# Patient Record
Sex: Female | Born: 1970 | Race: White | Hispanic: No | Smoking: Former smoker
Health system: Southern US, Community
[De-identification: ages and names within clinical notes are randomized; demographics above are authoritative.]

## PROBLEM LIST (undated history)

## (undated) DIAGNOSIS — F319 Bipolar disorder, unspecified: Secondary | ICD-10-CM

## (undated) DIAGNOSIS — E079 Disorder of thyroid, unspecified: Secondary | ICD-10-CM

## (undated) DIAGNOSIS — F209 Schizophrenia, unspecified: Secondary | ICD-10-CM

---

## 2015-09-21 ENCOUNTER — Encounter (HOSPITAL_COMMUNITY): Payer: Self-pay | Admitting: Emergency Medicine

## 2015-09-21 ENCOUNTER — Emergency Department (HOSPITAL_COMMUNITY)
Admission: EM | Admit: 2015-09-21 | Discharge: 2015-09-23 | Disposition: A | Discharge status: Home / Self Care | Payer: Medicare Other | Attending: Emergency Medicine | Admitting: Emergency Medicine

## 2015-09-21 DIAGNOSIS — F259 Schizoaffective disorder, unspecified: Secondary | ICD-10-CM | POA: Diagnosis present

## 2015-09-21 DIAGNOSIS — E079 Disorder of thyroid, unspecified: Secondary | ICD-10-CM | POA: Diagnosis not present

## 2015-09-21 DIAGNOSIS — Z915 Personal history of self-harm: Secondary | ICD-10-CM | POA: Insufficient documentation

## 2015-09-21 DIAGNOSIS — F39 Unspecified mood [affective] disorder: Secondary | ICD-10-CM

## 2015-09-21 DIAGNOSIS — Z3202 Encounter for pregnancy test, result negative: Secondary | ICD-10-CM | POA: Diagnosis not present

## 2015-09-21 DIAGNOSIS — Z79899 Other long term (current) drug therapy: Secondary | ICD-10-CM | POA: Insufficient documentation

## 2015-09-21 DIAGNOSIS — Z008 Encounter for other general examination: Secondary | ICD-10-CM | POA: Diagnosis present

## 2015-09-21 HISTORY — DX: Schizophrenia, unspecified: F20.9

## 2015-09-21 HISTORY — DX: Disorder of thyroid, unspecified: E07.9

## 2015-09-21 HISTORY — DX: Bipolar disorder, unspecified: F31.9

## 2015-09-21 LAB — CBC
HCT: 36.3 % (ref 36.0–46.0)
Hemoglobin: 11.5 g/dL — ABNORMAL LOW (ref 12.0–15.0)
MCH: 28.9 pg (ref 26.0–34.0)
MCHC: 31.7 g/dL (ref 30.0–36.0)
MCV: 91.2 fL (ref 78.0–100.0)
Platelets: 251 10*3/uL (ref 150–400)
RBC: 3.98 MIL/uL (ref 3.87–5.11)
RDW: 15.6 % — AB (ref 11.5–15.5)
WBC: 8.4 10*3/uL (ref 4.0–10.5)

## 2015-09-21 LAB — COMPREHENSIVE METABOLIC PANEL
ALK PHOS: 71 U/L (ref 38–126)
ALT: 36 U/L (ref 14–54)
ANION GAP: 10 (ref 5–15)
AST: 24 U/L (ref 15–41)
Albumin: 3.9 g/dL (ref 3.5–5.0)
BILIRUBIN TOTAL: 0.9 mg/dL (ref 0.3–1.2)
BUN: 15 mg/dL (ref 6–20)
CALCIUM: 9.1 mg/dL (ref 8.9–10.3)
CO2: 25 mmol/L (ref 22–32)
Chloride: 102 mmol/L (ref 101–111)
Creatinine, Ser: 1.17 mg/dL — ABNORMAL HIGH (ref 0.44–1.00)
GFR calc non Af Amer: 56 mL/min — ABNORMAL LOW (ref >60)
Glucose, Bld: 118 mg/dL — ABNORMAL HIGH (ref 65–99)
Potassium: 4.2 mmol/L (ref 3.5–5.1)
SODIUM: 137 mmol/L (ref 135–145)
TOTAL PROTEIN: 8 g/dL (ref 6.5–8.1)

## 2015-09-21 LAB — I-STAT BETA HCG BLOOD, ED (MC, WL, AP ONLY)

## 2015-09-21 LAB — ACETAMINOPHEN LEVEL

## 2015-09-21 LAB — TSH: TSH: >90 u[IU]/mL — ABNORMAL HIGH (ref 0.350–4.500)

## 2015-09-21 LAB — ETHANOL: Alcohol, Ethyl (B): <5 mg/dL (ref <5)

## 2015-09-21 LAB — SALICYLATE LEVEL

## 2015-09-21 LAB — LITHIUM LEVEL: Lithium Lvl: 0.08 mmol/L — ABNORMAL LOW (ref 0.60–1.20)

## 2015-09-21 MED ORDER — LEVOTHYROXINE SODIUM 200 MCG PO TABS
200.0000 ug | ORAL_TABLET | Freq: Every day | ORAL | Status: DC
  Administered 2015-09-22 – 2015-09-23 (×2): 200 ug via ORAL
  Filled 2015-09-21 (×4): qty 1

## 2015-09-21 MED ORDER — B COMPLEX-C PO TABS
1.0000 | ORAL_TABLET | Freq: Every day | ORAL | Status: DC
  Administered 2015-09-22 – 2015-09-23 (×2): 1 via ORAL
  Filled 2015-09-21 (×3): qty 1

## 2015-09-21 NOTE — ED Notes (Signed)
Pt oriented to room and unit.  She denies H/I, S/I, and AVH  She is focussed on her thyroid and believes that is why she is in the hospital.  She is pleasant and cooperative.  15 minute checks and video monitoring continue.

## 2015-09-21 NOTE — ED Provider Notes (Signed)
CSN: [id number]     Arrival date & time 09/21/15  1238 History   First MD Initiated Contact with Patient 09/21/15 1502     Chief Complaint  Patient presents with  . Medical Clearance     The history is provided by the patient and a relative. No language interpreter was used.   Eldana Isip is a 45 y.o. female who presents to the Emergency Department complaining of Psychiatric Evaluation.  Per pt she is here to get her thyroid checked because she thinks it is low due to vitamins mixing with her medications and she feels tired often. Pt is here with IVC by mother.  Pt thinks that her mental illness is due to her thyroid disease and that the psychiatric meds interfere with her thyroid medications.  Per IVC she is noncompliant with her psych meds, hearing voices and that the president is communicating to her and instructing her to do things.  She has a hx/o prior suicide attempt after taking too much Nyquil.    Past Medical History  Diagnosis Date  . Bipolar 1 disorder (HCC)   . Schizophrenia (HCC)   . Thyroid disease    History reviewed. No pertinent past surgical history. No family history on file. Social History  Substance Use Topics  . Smoking status: Never Smoker   . Smokeless tobacco: None  . Alcohol Use: No   OB History    No data available     Review of Systems  All other systems reviewed and are negative.     Allergies  Review of patient's allergies indicates no known allergies.  Home Medications   Prior to Admission medications   Medication Sig Start Date End Date Taking? Authorizing Provider  b complex vitamins tablet Take 1 tablet by mouth daily.   Yes Historical Provider, MD  ibuprofen (ADVIL,MOTRIN) 200 MG tablet Take 600-800 mg by mouth every 6 (six) hours as needed for headache or moderate pain.   Yes Historical Provider, MD  levothyroxine (SYNTHROID, LEVOTHROID) 200 MCG tablet Take 200 mcg by mouth daily. 09/07/15  Yes Historical Provider, MD   BP 128/77  mmHg  Pulse 85  Temp(Src) 98.1 F (36.7 C) (Oral)  Resp 18  SpO2 96%  LMP 09/07/2015 Physical Exam  Constitutional: She is oriented to person, place, and time. She appears well-developed and well-nourished.  HENT:  Head: Normocephalic and atraumatic.  Cardiovascular: Normal rate and regular rhythm.   No murmur heard. Pulmonary/Chest: Effort normal and breath sounds normal. No respiratory distress.  Abdominal: Soft. There is no tenderness. There is no rebound and no guarding.  Musculoskeletal: She exhibits no edema or tenderness.  Neurological: She is alert and oriented to person, place, and time.  Skin: Skin is warm and dry.  Psychiatric:  Flat affect  Nursing note and vitals reviewed.   ED Course  Procedures (including critical care time) Labs Review Labs Reviewed  COMPREHENSIVE METABOLIC PANEL - Abnormal; Notable for the following:    Glucose, Bld 118 (*)    Creatinine, Ser 1.17 (*)    GFR calc non Af Amer 56 (*)    All other components within normal limits  ACETAMINOPHEN LEVEL - Abnormal; Notable for the following:    Acetaminophen (Tylenol), Serum <10 (*)    All other components within normal limits  CBC - Abnormal; Notable for the following:    Hemoglobin 11.5 (*)    RDW 15.6 (*)    All other components within normal limits  LITHIUM LEVEL - Abnormal;  Notable for the following:    Lithium Lvl 0.08 (*)    All other components within normal limits  ETHANOL  SALICYLATE LEVEL  URINE RAPID DRUG SCREEN, HOSP PERFORMED  TSH  I-STAT BETA HCG BLOOD, ED ([facility], [facility], [facility] ONLY)    Imaging Review No results found. I have personally reviewed and evaluated these images and lab results as part of my medical decision-making.   EKG Interpretation None      MDM   Final diagnoses:  Mood disorder [facility])    Patient here with delusions and hallucinations and IVC paperwork completed by the patient's mother. Patient denies delusions or hallucinations but appears to be fixated on  her thyroid.  Plan to observe with psychiatry reevaluation in the morning.  Pt's TSH is greater than 90, free T4 pending at time of note.  Plan to restart her synthroid with Endocrinology follow up.    Tilden Fossa, MD 09/22/15 [id number]

## 2015-09-21 NOTE — ED Notes (Signed)
Bed: [facility] Expected date:  Expected time:  Means of arrival:  Comments: Hold for [employer]

## 2015-09-21 NOTE — BH Assessment (Addendum)
Tele Assessment Note   Meghan Hensley is a 45 y.o. Caucasian female who was admitted to the [facility] ED under IVC (petition completed by [name], [name] -- [phone]); mother reported that Pt is not taking prescribed psychotropic medication and is experiencing delusional thinking and acting recklessly (e.g., taking out insurance under false pretenses, using her mother's name as a co-signer).  Per mother's report, Pt has a history of Bipolar disorder and schizophrenia.  Pt was assessed via the teleassessment machine.  Demeanor was very cautious -- she appeared to consider her words based on the impact it may have.  Mood was reported variably -- she stated that she felt "fine" and then said that she is off of her thyroid medication and that can make her feel depressed and that she feels depressed now.  Affect was guarded and flat.  She appeared fatigued.  She denied suicidal ideation, but endorsed a past suicide by overdose of Nyquil in 12-18-1996.  Pt denied that she takes psychotropic medications, denied having a psychiatrist, and denied being in therapy.  "It's not a bad idea, but I don't know with my thyroid."  When asked why she was at the hospital, Pt stated that she has a bad thyroid and has been off of her thyroid medication.  Pt appeared to be preoccupied with two subjects -- her thyroid condition (which alternated between hypothyroidism and hyperthyroidism) and the death of her father last year.  Pt stated that she is on disability due to hypothyroidism.  When asked about her mental state, she related it to her thyroid condition.  "My problem is that my thyroid is too low ... It makes me depressed, and so I'm not eating right now."  Pt denied any auditory/visual hallucination, and she also denied any delusion.  When asked about her mother, Pt said that she needs to protect her mother -- her mother is a widow who lives in [place] "for her safety", and she is concerned that her mother is  safe.  When asked about the IVC paper, Pt did not react, stating instead that she is concerned about her mother.    Pt stated that she has a number of stressors in her life -- primarily, she is concerned about her thyroid, she is concerned about her mother's well-being (she repeated several times that her father died in the summer of 12/19/14, a fact mother confirmed), and that her car was repossessed.  She denied use of substances and she stated that she drinks red wine socially.  "It's important that you know that I am from [place] and we drink ... Also, I am an active [employer]  Pt presented in street clothes.  She is overweight; otherwise, appearance was unremarkable.  She was oriented to time, place, and person, but seemed unclear as to why she was in the hospital -- she believed that her thyroid is of concern.  Speech was slow and regular in rate and rhythm and volume.  Concentration was fair.  Judgement was fair.  Impulse control seemed intact.  Author consulted with [name] by phone.  Mother was very animated on the phone, stating that Pt is increasingly delusional -- to wit, she believes that she is with [employer] and that the president is contacting her.  Mother also stated that Pt has a history of auditory and visual hallucinations.  She also stated that Pt refuses to take any psychotropic medication.  She said that Pt is quickly running out of resources,  having already exhausted most of mother's savings, and that she will be out on the street and dead soon.  She described Pt as very intelligent and manipulative, someone who will lie to avoid confinement/hospitalization.  She confirmed that Pt has attempted suicide in the past, that Pt's father died in 07/01/2016and that Pt is on disability.    Diagnosis: Bipolar; Schizophrenia  Past Medical History:  Past Medical History  Diagnosis Date  . Bipolar 1 disorder (HCC)   . Schizophrenia (HCC)   . Thyroid disease     History  reviewed. No pertinent past surgical history.  Family History: No family history on file.  Social History:  reports that she has never smoked. She does not have any smokeless tobacco history on file. She reports that she does not drink alcohol. Her drug history is not on file.  Additional Social History:  Alcohol / Drug Use Pain Medications: See PTA Prescriptions: See PTA Over the Counter: See PTA History of alcohol / drug use?: No history of alcohol / drug abuse  CIWA: CIWA-Ar BP: 128/77 mmHg Pulse Rate: 85 COWS:    PATIENT STRENGTHS: (choose at least two) Average or above average intelligence Capable of independent living Communication skills Supportive family/friends  Allergies: No Known Allergies  Home Medications:  (Not in a hospital admission)  OB/GYN Status:  Patient's last menstrual period was 09/07/2015.  General Assessment Data [facility] of Assessment: [facility] TTS Assessment: In system Is this a Tele or Face-to-Face Assessment?: Tele Assessment Is this an Initial Assessment or a Re-assessment for this encounter?: Initial Assessment Marital status: Divorced Is patient pregnant?: No Pregnancy Status: No Living Arrangements: Alone Can pt return to current living arrangement?: Yes Admission Status: Involuntary Is patient capable of signing voluntary admission?: Yes Referral Source: MD Insurance type: Self Pay  Medical Screening Exam [facility] Walk-in ONLY) Medical Exam completed: Yes  Crisis Care Plan Living Arrangements: Alone Name of Psychiatrist: Denies Name of Therapist: Denies  Education Status Is patient currently in school?: No Highest grade of school patient has completed: College Name of school: [employer]  Risk to self with the past 6 months Suicidal Ideation: No-Not Currently/Within Last 6 Months Has patient been a risk to self within the past 6 months prior to admission? : No Suicidal Intent: No-Not Currently/Within Last 6 Months Has patient had any  suicidal intent within the past 6 months prior to admission? : No Is patient at risk for suicide?: No Suicidal Plan?: No Has patient had any suicidal plan within the past 6 months prior to admission? : No Access to Means: No What has been your use of drugs/alcohol within the last 12 months?: Pt denied beyond social use of red wine Previous Attempts/Gestures: Yes How many times?: 1 Triggers for Past Attempts: Unknown Intentional Self Injurious Behavior: None Family Suicide History: No Recent stressful life event(s): Other (Comment) (Pt denied; mother said pt is out of money) Persecutory voices/beliefs?: No (Pt denied; mother said Pt has history of such) Depression: Yes ("If I don't take my thyroid medicine") Depression Symptoms: Fatigue, Feeling angry/irritable Substance abuse history and/or treatment for substance abuse?: No Suicide prevention information given to non-admitted patients: Not applicable  Risk to Others within the past 6 months Homicidal Ideation: No Does patient have any lifetime risk of violence toward others beyond the six months prior to admission? : No Thoughts of Harm to Others: No Current Homicidal Intent: No Current Homicidal Plan: No Access to Homicidal Means: No History of harm to others?:  No Assessment of Violence: None Noted Does patient have access to weapons?: No Criminal Charges Pending?: No Does patient have a court date: No Is patient on probation?: No  Psychosis Hallucinations: None noted (Pt denied; see narrative) Delusions: None noted (Pt denied; see narrative)  Mental Status Report Appearance/Hygiene: Unremarkable Eye Contact: Good Motor Activity: Unremarkable Speech: Tangential Level of Consciousness: Drowsy Mood: Ambivalent, Preoccupied Affect: Flat Anxiety Level: None Thought Processes: Tangential Judgement: Unimpaired Orientation: Person, Place, Time, Situation, Appropriate for developmental age Obsessive Compulsive  Thoughts/Behaviors: None  Cognitive Functioning Concentration: Normal Memory: Recent Intact, Remote Intact IQ: Average Insight: Fair Impulse Control: Fair Appetite: Good Sleep: No Change Vegetative Symptoms: None  ADLScreening [facility] Assessment Services) Patient's cognitive ability adequate to safely complete daily activities?: Yes Patient able to express need for assistance with ADLs?: Yes Independently performs ADLs?: Yes (appropriate for developmental age)  Prior Inpatient Therapy Prior Inpatient Therapy: Yes Prior Therapy Dates: Approx 1998 Prior Therapy Facilty/Provider(s): [facility] [place] [facility] Reason for Treatment: OD on Nyquil  Prior Outpatient Therapy Prior Outpatient Therapy: No Does patient have an ACCT team?: No Does patient have Intensive In-House Services?  : No Does patient have [facility] services? : No Does patient have P4CC services?: No  ADL Screening (condition at time of admission) Patient's cognitive ability adequate to safely complete daily activities?: Yes Is the patient deaf or have difficulty hearing?: No Does the patient have difficulty seeing, even when wearing glasses/contacts?: No Does the patient have difficulty concentrating, remembering, or making decisions?: No Patient able to express need for assistance with ADLs?: Yes Does the patient have difficulty dressing or bathing?: No Independently performs ADLs?: Yes (appropriate for developmental age) Does the patient have difficulty walking or climbing stairs?: No Weakness of Legs: None Weakness of Arms/Hands: None       Abuse/Neglect Assessment (Assessment to be complete while patient is alone) Physical Abuse: Denies Verbal Abuse: Denies Sexual Abuse: Denies Exploitation of patient/patient's resources: Denies Self-Neglect: Denies Values / Beliefs Cultural Requests During Hospitalization: None Spiritual Requests During Hospitalization: None Consults Spiritual  Care Consult Needed: No Social Work Consult Needed: No Merchant navy officer (For Healthcare) Does patient have an advance directive?: No Would patient like information on creating an advanced directive?: No - patient declined information    Additional Information 1:1 In Past 12 Months?: No CIRT Risk: No Elopement Risk: No Does patient have medical clearance?: Yes     Disposition:  Disposition Initial Assessment Completed for this Encounter: Yes Disposition of Patient: Other dispositions Other disposition(s): Other (Comment) (Per Chilton Greathouse FNP, Pt to be held overnight, reax by Psychiatry)  Dorris Fetch Kveon Casanas 09/21/2015 5:14 PM

## 2015-09-21 NOTE — ED Notes (Signed)
Patient's mother: Docia Furl, phone number: [phone]  Mother states her daughter needs serious psychiatric help. States her daughter has a double major in Education officer, environmental and psychology, and has been using her financially. Has multiple complaints and wants to speak with the psychiatric team if possible.

## 2015-09-21 NOTE — ED Notes (Signed)
Per [employer]/IVC paperwork-patient has not been taking her meds-delusional-thinks she works for the [employer]

## 2015-09-21 NOTE — ED Notes (Signed)
Pt AAO x 3, no distress noted, complaining of leg cramps at present, pt offered Gatorade.  Monitoring for safety, Q 15 min checks in effect.

## 2015-09-22 DIAGNOSIS — F259 Schizoaffective disorder, unspecified: Secondary | ICD-10-CM | POA: Diagnosis present

## 2015-09-22 LAB — T4, FREE: FREE T4: 0.44 ng/dL — AB (ref 0.61–1.12)

## 2015-09-22 NOTE — Consult Note (Signed)
[facility] Consult   Reason for Consult:  Schizoaffective disorder  Referring Physician:  EDP Patient Identification: Meghan Hensley MRN:  [id number] Principal Diagnosis: Schizoaffective disorder, unspecified ([place]) Diagnosis:   Patient Active Problem List   Diagnosis Date Noted  . Schizoaffective disorder, unspecified ([place]) [F25.9] 09/22/2015    Total Time spent with patient: 30 minutes  Subjective:   Meghan Hensley is a 45 y.o. female patient to be evaluated for mood disorder.Patient reports " I here because my behavioral is becoming more erratic, I wanted have my thyroid disorder evaluated."  Meghan Hensley is awake, alert and oriented X4 , found resting in bedroom. Denies suicidal or homicidal ideation. Denies auditory or visual hallucination and does not appear to be responding to internal stimuli. Patient is ruminative with her thyroid disordered. Patient denies depression or symptoms at this time. Support, encouragement and reassurance was provided   HPI: Per tele- assessment note-Meghan Hensley is a 45 y.o. Caucasian female who was admitted to the [facility] ED under IVC (petition completed by [name], Tamarah Bhullar -- [phone]); mother reported that Pt is not taking prescribed psychotropic medication and is experiencing delusional thinking and acting recklessly (e.g., taking out insurance under false pretenses, using her mother's name as a co-signer). Per mother's report, Pt has a history of Bipolar disorder and schizophrenia. Pt was assessed via the teleassessment machine. Demeanor was very cautious -- she appeared to consider her words based on the impact it may have. Mood was reported variably -- she stated that she felt "fine" and then said that she is off of her thyroid medication and that can make her feel depressed and that she feels depressed now. Affect was guarded and flat. She appeared fatigued. She denied suicidal ideation, but  endorsed a past suicide by overdose of Nyquil in 1998. Pt denied that she takes psychotropic medications, denied having a psychiatrist, and denied being in therapy. "It's not a bad idea, but I don't know with my thyroid." When asked why she was at the hospital, Pt stated that she has a bad thyroid and has been off of her thyroid medication. Pt appeared to be preoccupied with two subjects -- her thyroid condition (which alternated between hypothyroidism and hyperthyroidism) and the death of her father last year. Pt stated that she is on disability due to hypothyroidism. When asked about her mental state, she related it to her thyroid condition. "My problem is that my thyroid is too low ... It makes me depressed, and so I'm not eating right now." Pt denied any auditory/visual hallucination, and she also denied any delusion. When asked about her mother, Pt said that she needs to protect her mother -- her mother is a widow who lives in [place] "for her safety", and she is concerned that her mother is safe. When asked about the IVC paper, Pt did not react, stating instead that she is concerned about her mother.   Past Psychiatric History: See above  Risk to Self: Suicidal Ideation: No-Not Currently/Within Last 6 Months Suicidal Intent: No-Not Currently/Within Last 6 Months Is patient at risk for suicide?: No Suicidal Plan?: No Access to Means: No What has been your use of drugs/alcohol within the last 12 months?: Pt denied beyond social use of red wine How many times?: 1 Triggers for Past Attempts: Unknown Intentional Self Injurious Behavior: None Risk to Others: Homicidal Ideation: No Thoughts of Harm to Others: No Current Homicidal Intent: No Current Homicidal Plan: No Access to Homicidal Means: No History of harm  to others?: No Assessment of Violence: None Noted Does patient have access to weapons?: No Criminal Charges Pending?: No Does patient have a court date: No Prior Inpatient  Therapy: Prior Inpatient Therapy: Yes Prior Therapy Dates: Approx 1998 Prior Therapy Facilty/Provider(s): Possibly [facility] [place] [facility] Reason for Treatment: OD on Nyquil Prior Outpatient Therapy: Prior Outpatient Therapy: No Does patient have an ACCT team?: No Does patient have Intensive In-House Services?  : No Does patient have [facility] services? : No Does patient have P4CC services?: No  Past Medical History:  Past Medical History  Diagnosis Date  . Bipolar 1 disorder ([facility]CC)   . Schizophrenia ([facility])   . Thyroid disease    History reviewed. No pertinent past surgical history. Family History: No family history on file. Family Psychiatric  History: Unknown Social History:  History  Alcohol Use No     History  Drug Use Not on file    Social History   Social History  . Marital Status: N/A    Spouse Name: N/A  . Number of Children: N/A  . Years of Education: N/A   Social History Main Topics  . Smoking status: Never Smoker   . Smokeless tobacco: None  . Alcohol Use: No  . Drug Use: None  . Sexual Activity: Not Asked   Other Topics Concern  . None   Social History Narrative  . None   Additional Social History:    Allergies:  No Known Allergies  Labs:  Results for orders placed or performed during the hospital encounter of 09/21/15 (from the past 48 hour(s))  Lithium level     Status: Abnormal   Collection Time: 09/21/15  1:50 PM  Result Value Ref Range   Lithium Lvl 0.08 (L) 0.60 - 1.20 mmol/L  TSH     Status: Abnormal   Collection Time: 09/21/15  1:50 PM  Result Value Ref Range   TSH >90.000 (H) 0.350 - 4.500 uIU/mL  I-Stat beta hCG blood, ED ([facility], [facility], [facility] only)     Status: None   Collection Time: 09/21/15  1:55 PM  Result Value Ref Range   I-stat hCG, quantitative <5.0 <5 mIU/mL   Comment 3            Comment:   GEST. AGE      CONC.  (mIU/mL)   <=1 WEEK        5 - 50     2 WEEKS       50 - 500     3 WEEKS       100 - 10,000     4 WEEKS      1,000 - 30,000        FEMALE AND NON-PREGNANT FEMALE:     LESS THAN 5 mIU/mL   Comprehensive metabolic panel     Status: Abnormal   Collection Time: 09/21/15  2:04 PM  Result Value Ref Range   Sodium 137 135 - 145 mmol/L   Potassium 4.2 3.5 - 5.1 mmol/L   Chloride 102 101 - 111 mmol/L   CO2 25 22 - 32 mmol/L   Glucose, Bld 118 (H) 65 - 99 mg/dL   BUN 15 6 - 20 mg/dL   Creatinine, Ser 1.75 (H) 0.44 - 1.00 mg/dL   Calcium 9.1 8.9 - 12.8 mg/dL   Total Protein 8.0 6.5 - 8.1 g/dL   Albumin 3.9 3.5 - 5.0 g/dL   AST 24 15 - 41 U/L   ALT 36 14 -  54 U/L   Alkaline Phosphatase 71 38 - 126 U/L   Total Bilirubin 0.9 0.3 - 1.2 mg/dL   GFR calc non Af Amer 56 (L) >60 mL/min   GFR calc Af Amer >60 >60 mL/min    Comment: (NOTE) The eGFR has been calculated using the CKD EPI equation. This calculation has not been validated in all clinical situations. eGFR's persistently <60 mL/min signify possible Chronic Kidney Disease.    Anion gap 10 5 - 15  CBC     Status: Abnormal   Collection Time: 09/21/15  2:04 PM  Result Value Ref Range   WBC 8.4 4.0 - 10.5 K/uL   RBC 3.98 3.87 - 5.11 MIL/uL   Hemoglobin 11.5 (L) 12.0 - 15.0 g/dL   HCT 36.3 36.0 - 46.0 %   MCV 91.2 78.0 - 100.0 fL   MCH 28.9 26.0 - 34.0 pg   MCHC 31.7 30.0 - 36.0 g/dL   RDW 15.6 (H) 11.5 - 15.5 %   Platelets 251 150 - 400 K/uL  Ethanol (ETOH)     Status: None   Collection Time: 09/21/15  2:05 PM  Result Value Ref Range   Alcohol, Ethyl (B) <5 <5 mg/dL    Comment:        LOWEST DETECTABLE LIMIT FOR SERUM ALCOHOL IS 5 mg/dL FOR MEDICAL PURPOSES ONLY   Salicylate level     Status: None   Collection Time: 09/21/15  2:05 PM  Result Value Ref Range   Salicylate Lvl <2.6 2.8 - 30.0 mg/dL  Acetaminophen level     Status: Abnormal   Collection Time: 09/21/15  2:05 PM  Result Value Ref Range   Acetaminophen (Tylenol), Serum <10 (L) 10 - 30 ug/mL    Comment:        THERAPEUTIC CONCENTRATIONS VARY SIGNIFICANTLY. A RANGE  OF 10-30 ug/mL MAY BE AN EFFECTIVE CONCENTRATION FOR MANY PATIENTS. HOWEVER, SOME ARE BEST TREATED AT CONCENTRATIONS OUTSIDE THIS RANGE. ACETAMINOPHEN CONCENTRATIONS >150 ug/mL AT 4 HOURS AFTER INGESTION AND >50 ug/mL AT 12 HOURS AFTER INGESTION ARE OFTEN ASSOCIATED WITH TOXIC REACTIONS.   T4, free     Status: Abnormal   Collection Time: 09/21/15 11:30 PM  Result Value Ref Range   Free T4 0.44 (L) 0.61 - 1.12 ng/dL    Comment: Performed at [facility]    Current Facility-Administered Medications  Medication Dose Route Frequency Provider Last Rate Last Dose  . B-complex with vitamin C tablet 1 tablet  1 tablet Oral Daily Quintella Reichert, MD   1 tablet at 09/22/15 0820  . levothyroxine (SYNTHROID, LEVOTHROID) tablet 200 mcg  200 mcg Oral Daily Quintella Reichert, MD   200 mcg at 09/22/15 8341   Current Outpatient Prescriptions  Medication Sig Dispense Refill  . b complex vitamins tablet Take 1 tablet by mouth daily.    Marland Kitchen ibuprofen (ADVIL,MOTRIN) 200 MG tablet Take 600-800 mg by mouth every 6 (six) hours as needed for headache or moderate pain.    Marland Kitchen levothyroxine (SYNTHROID, LEVOTHROID) 200 MCG tablet Take 200 mcg by mouth daily.  11    Musculoskeletal: Strength & Muscle Tone: within normal limits Gait & Station: normal Patient leans: N/A  Psychiatric Specialty Exam: ROS  Blood pressure 138/80, pulse 72, temperature 98 F (36.7 C), temperature source Oral, resp. rate 19, last menstrual period 09/07/2015, SpO2 100 %.There is no height or weight on file to calculate BMI.   General Appearance: Bizarre  Eye Contact:: Minimal  Speech: Pressured  Volume: Increased  Mood: Anxious and Dysphoric  Affect: Restricted, blocked thought process  Thought Process: Goal Directed  Orientation: Full (Time, Place, and Person)  Thought Content: Rumination with thyroid disorder  Suicidal Thoughts: No  Homicidal Thoughts: No  Memory: Immediate; Fair Recent;  Fair Remote; Fair  Judgement: Intact  Insight: Fair and Present  Psychomotor Activity: Normal  Concentration: Fair  Recall: [employer] of Knowledge:Fair  Language: Fair  Akathisia: No  Handed: Right  AIMS (if indicated):    Assets: Desire for Improvement  ADL's: Intact  Cognition: WNL  Sleep:          Patient seen face-to-face for psychiatric evaluation follow-up, chart reviewed and case discussed with the MD Lovena Le, Advanced Practice Provider and Treatment team. Reviewed the information documented and agree with the treatment plan.   Treatment Plan Summary: Daily contact with patient to assess and evaluate symptoms and progress in treatment and Medication management  Disposition: Plan: Uphold IVC and reassessment by Psychiatry in the morning.    Derrill Center, NP 09/22/2015 2:23 PM

## 2015-09-22 NOTE — ED Notes (Signed)
Patient in bed resting at the beginning of the shift. Her mood and affects sad and depressed. She reported having some complaint; " I feel kind of depressed, and my leg has been cold, I have eaten but I didn't feel full". Patient thought process disorganized and scattered. Writer offered patient another blanket and a thick pare of footie. Writer encouraged and supported patient. Q 15 minute checks continues for safety.

## 2015-09-22 NOTE — ED Notes (Signed)
Patient in her room resting. Writer asked patient if she needed any sleep aid. Patient stated; "no". Writer encouraged patient to notify staff if she had difficulty falling or staying asleep. Q 15 minute checks continues for safety.

## 2015-09-22 NOTE — ED Notes (Signed)
Pt sits quietly in her room, except to go to the restroom, without the TV on. Mostly she sits on the bed with elbows propped on the bed-side tray with her hands covering her eyes and appears to be sleeping in that position.

## 2015-09-22 NOTE — ED Notes (Signed)
When anyone asks pt a question she starts telling them about her father, his cancer, and her mother, mostly ignoring the question that was asked.

## 2015-09-22 NOTE — ED Notes (Signed)
Pt expresses concern about her thyroid level - she said she knows when it is abnormal. Her eye contact is direct and her eyes appear to be somewhat exopthalmic because her eye balls bulge slightly. She shared that she is basically alone in the world because her father died from cancer last year and now the only person that she has in the world is her mother. She believes that her mother has Alzheimer's disease.

## 2015-09-23 DIAGNOSIS — F259 Schizoaffective disorder, unspecified: Secondary | ICD-10-CM | POA: Diagnosis not present

## 2015-09-23 NOTE — ED Notes (Addendum)
Pt alert/oriented,continues to deny si/hi/avh.  Written dc instructions reviewed w/ pt.  Pt encouraged to follow up with her medical dr. as scheduled and take her medications as directed.  Pt verbalized understanding.  Bus pass given.  Pt ambulatory w/o difficulty to dc area, belongings returned after leaving the area.

## 2015-09-23 NOTE — ED Notes (Signed)
Up to the bathroom 

## 2015-09-23 NOTE — Consult Note (Signed)
[place] Psychiatry Consult   Reason for Consult:  Follow up Referring Physician:  Follow up Patient Identification: Meghan Hensley MRN:  [id number] Principal Diagnosis: Schizoaffective disorder, unspecified ([place]) Diagnosis:   Patient Active Problem List   Diagnosis Date Noted  . Schizoaffective disorder, unspecified ([place]) [F25.9] 09/22/2015    Total Time spent with patient: 20 minutes  Subjective:   Meghan Hensley is a 45 y.o. female patient admitted with depression.  HPI:  Meghan Hensley is not hearing voices, does not believe she is working for [employer] or getting messages from [employer].  She does present with some autistic traits perseverating on issues, going into extreme detail and not being able to switch to something else until she completes the answer her way.  Her father died last year and that set her back she said.  She perseverates on the thyroid issues saying she takes Synthroid but takes it erratically.  She has been diagnosed with schizophrenia in the past but is displaying no symptoms currently.  She recognizes she is depressed but does not want to take any medications for it.  She is on disability, lives by herself and likes it that way, no social activities.  Her mother who lives in [place] petitioned to have her committed.  She currently believes she is her normal self except for needing her thyroid regulated.  Past Psychiatric History: has been diagnosed with bipolar and schizophrenia but takes no medications by choice  Hospitalized once in 1998  Risk to Self: Suicidal Ideation: No-Not Currently/Within Last 6 Months Suicidal Intent: No-Not Currently/Within Last 6 Months Is patient at risk for suicide?: No Suicidal Plan?: No Access to Means: No What has been your use of drugs/alcohol within the last 12 months?: Pt denied beyond social use of red wine How many times?: 1 Triggers for Past Attempts: Unknown Intentional Self Injurious Behavior:  None Risk to Others: Homicidal Ideation: No Thoughts of Harm to Others: No Current Homicidal Intent: No Current Homicidal Plan: No Access to Homicidal Means: No History of harm to others?: No Assessment of Violence: None Noted Does patient have access to weapons?: No Criminal Charges Pending?: No Does patient have a court date: No Prior Inpatient Therapy: Prior Inpatient Therapy: Yes Prior Therapy Dates: Approx 1998 Prior Therapy Facilty/Provider(s): Possibly [place] area Reason for Treatment: OD on Nyquil Prior Outpatient Therapy: Prior Outpatient Therapy: No Does patient have an ACCT team?: No Does patient have Intensive In-House Services?  : No Does patient have [facility] services? : No Does patient have P4CC services?: No  Past Medical History:  Past Medical History  Diagnosis Date  . Bipolar 1 disorder ([place])   . Schizophrenia ([place])   . Thyroid disease    History reviewed. No pertinent past surgical history. Family History: No family history on file. Family Psychiatric  History: none reported Social History:  History  Alcohol Use No     History  Drug Use Not on file    Social History   Social History  . Marital Status: Divorced    Spouse Name: N/A  . Number of Children: N/A  . Years of Education: N/A   Social History Main Topics  . Smoking status: Never Smoker   . Smokeless tobacco: None  . Alcohol Use: No  . Drug Use: None  . Sexual Activity: Not Asked   Other Topics Concern  . None   Social History Narrative  . None   Additional Social History:    Allergies:  No Known Allergies  Labs:  Results for orders placed or performed during the hospital encounter of 09/21/15 (from the past 48 hour(s))  Lithium level     Status: Abnormal   Collection Time: 09/21/15  1:50 PM  Result Value Ref Range   Lithium Lvl 0.08 (L) 0.60 - 1.20 mmol/L  TSH     Status: Abnormal   Collection Time: 09/21/15  1:50 PM  Result Value Ref Range    TSH >90.000 (H) 0.350 - 4.500 uIU/mL  I-Stat beta hCG blood, ED ([facility], [facility], [facility] only)     Status: None   Collection Time: 09/21/15  1:55 PM  Result Value Ref Range   I-stat hCG, quantitative <5.0 <5 mIU/mL   Comment 3            Comment:   GEST. AGE      CONC.  (mIU/mL)   <=1 WEEK        5 - 50     2 WEEKS       50 - 500     3 WEEKS       100 - 10,000     4 WEEKS     1,000 - 30,000        FEMALE AND NON-PREGNANT FEMALE:     LESS THAN 5 mIU/mL   Comprehensive metabolic panel     Status: Abnormal   Collection Time: 09/21/15  2:04 PM  Result Value Ref Range   Sodium 137 135 - 145 mmol/L   Potassium 4.2 3.5 - 5.1 mmol/L   Chloride 102 101 - 111 mmol/L   CO2 25 22 - 32 mmol/L   Glucose, Bld 118 (H) 65 - 99 mg/dL   BUN 15 6 - 20 mg/dL   Creatinine, Ser 1.17 (H) 0.44 - 1.00 mg/dL   Calcium 9.1 8.9 - 10.3 mg/dL   Total Protein 8.0 6.5 - 8.1 g/dL   Albumin 3.9 3.5 - 5.0 g/dL   AST 24 15 - 41 U/L   ALT 36 14 - 54 U/L   Alkaline Phosphatase 71 38 - 126 U/L   Total Bilirubin 0.9 0.3 - 1.2 mg/dL   GFR calc non Af Amer 56 (L) >60 mL/min   GFR calc Af Amer >60 >60 mL/min    Comment: (NOTE) The eGFR has been calculated using the CKD EPI equation. This calculation has not been validated in all clinical situations. eGFR's persistently <60 mL/min signify possible Chronic Kidney Disease.    Anion gap 10 5 - 15  CBC     Status: Abnormal   Collection Time: 09/21/15  2:04 PM  Result Value Ref Range   WBC 8.4 4.0 - 10.5 K/uL   RBC 3.98 3.87 - 5.11 MIL/uL   Hemoglobin 11.5 (L) 12.0 - 15.0 g/dL   HCT 36.3 36.0 - 46.0 %   MCV 91.2 78.0 - 100.0 fL   MCH 28.9 26.0 - 34.0 pg   MCHC 31.7 30.0 - 36.0 g/dL   RDW 15.6 (H) 11.5 - 15.5 %   Platelets 251 150 - 400 K/uL  Ethanol (ETOH)     Status: None   Collection Time: 09/21/15  2:05 PM  Result Value Ref Range   Alcohol, Ethyl (B) <5 <5 mg/dL    Comment:        LOWEST DETECTABLE LIMIT FOR SERUM ALCOHOL IS 5 mg/dL FOR MEDICAL PURPOSES ONLY    Salicylate level     Status: None   Collection Time: 09/21/15  2:05  PM  Result Value Ref Range   Salicylate Lvl <4.0 2.8 - 30.0 mg/dL  Acetaminophen level     Status: Abnormal   Collection Time: 09/21/15  2:05 PM  Result Value Ref Range   Acetaminophen (Tylenol), Serum <10 (L) 10 - 30 ug/mL    Comment:        THERAPEUTIC CONCENTRATIONS VARY SIGNIFICANTLY. A RANGE OF 10-30 ug/mL MAY BE AN EFFECTIVE CONCENTRATION FOR MANY PATIENTS. HOWEVER, SOME ARE BEST TREATED AT CONCENTRATIONS OUTSIDE THIS RANGE. ACETAMINOPHEN CONCENTRATIONS >150 ug/mL AT 4 HOURS AFTER INGESTION AND >50 ug/mL AT 12 HOURS AFTER INGESTION ARE OFTEN ASSOCIATED WITH TOXIC REACTIONS.   T4, free     Status: Abnormal   Collection Time: 09/21/15 11:30 PM  Result Value Ref Range   Free T4 0.44 (L) 0.61 - 1.12 ng/dL    Comment: Performed at [facility]    Current Facility-Administered Medications  Medication Dose Route Frequency Provider Last Rate Last Dose  . B-complex with vitamin C tablet 1 tablet  1 tablet Oral Daily Tilden Fossa, MD   1 tablet at 09/22/15 0820  . levothyroxine (SYNTHROID, LEVOTHROID) tablet 200 mcg  200 mcg Oral Daily Tilden Fossa, MD   200 mcg at 09/23/15 5825   Current Outpatient Prescriptions  Medication Sig Dispense Refill  . b complex vitamins tablet Take 1 tablet by mouth daily.    Marland Kitchen ibuprofen (ADVIL,MOTRIN) 200 MG tablet Take 600-800 mg by mouth every 6 (six) hours as needed for headache or moderate pain.    Marland Kitchen levothyroxine (SYNTHROID, LEVOTHROID) 200 MCG tablet Take 200 mcg by mouth daily.  11    Musculoskeletal: Strength & Muscle Tone: within normal limits Gait & Station: normal Patient leans: N/A  Psychiatric Specialty Exam: Review of Systems  Constitutional: Negative.   HENT: Negative.   Eyes: Negative.   Respiratory: Negative.   Cardiovascular: Negative.   Gastrointestinal: Negative.   Genitourinary: Negative.   Musculoskeletal: Positive for myalgias.   Skin: Negative.   Neurological: Negative.   Endo/Heme/Allergies: Negative.   Psychiatric/Behavioral: Positive for depression.    Blood pressure 129/76, pulse 73, temperature 97.8 F (36.6 C), temperature source Oral, resp. rate 18, last menstrual period 09/07/2015, SpO2 98 %.There is no height or weight on file to calculate BMI.  General Appearance: Casual  Eye Contact::  Good  Speech:  Clear and Coherent  Volume:  Normal  Mood:  Depressed  Affect:  Blunt  Thought Process:  Coherent and Logical  Orientation:  Full (Time, Place, and Person)  Thought Content:  Negative  Suicidal Thoughts:  No  Homicidal Thoughts:  No  Memory:  Immediate;   Good Recent;   Good Remote;   Good  Judgement:  Intact  Insight:  Shallow  Psychomotor Activity:  Normal  Concentration:  Good  Recall:  Good  Fund of Knowledge:Good  Language: Good  Akathisia:  Negative  Handed:  Right  AIMS (if indicated):     Assets:  Special educational needs teacher  ADL's:  Intact  Cognition: WNL  Sleep:      Treatment Plan Summary: Plan will discharge home today as she no longer meets involuntary commitment rules.  no apparent schizophrenia or bipolar symptoms.  is depressed and admits that but says she has taken meds in the past and does not want to take any.  she will follow up with her PCP for thyroid management.  IVC will be rescinded.  No suicidal thoughts or threats.  Disposition: No evidence of  imminent risk to self or others at present.    Donnelly Angelica, MD 09/23/2015 8:52 AM

## 2015-09-23 NOTE — ED Notes (Signed)
CSW into see 

## 2015-09-23 NOTE — ED Notes (Signed)
Talking w/ dr Ladona Ridgel in hall

## 2015-09-23 NOTE — [facility] Suicide Risk Assessment (Signed)
[facility] Discharge Suicide Risk Assessment   Principal Problem: Schizoaffective disorder, unspecified [facility]) Discharge Diagnoses:  Patient Active Problem List   Diagnosis Date Noted  . Schizoaffective disorder, unspecified ([facility]) [F25.9] 09/22/2015    Total Time spent with patient: 20 minutes  Musculoskeletal: Strength & Muscle Tone: within normal limits Gait & Station: normal Patient leans: N/A  Psychiatric Specialty Exam: ROS  Blood pressure 129/76, pulse 73, temperature 97.8 F (36.6 C), temperature source Oral, resp. rate 18, last menstrual period 09/07/2015, SpO2 98 %.There is no height or weight on file to calculate BMI.  General Appearance: Fairly Groomed  Patent attorney::  Good  Speech:  Clear and Coherent409  Volume:  Normal  Mood:  Depressed  Affect:  Appropriate  Thought Process:  Coherent  Orientation:  Full (Time, Place, and Person)  Thought Content:  Negative  Suicidal Thoughts:  No  Homicidal Thoughts:  No  Memory:  Immediate;   Good Recent;   Good Remote;   Good  Judgement:  Fair  Insight:  Fair  Psychomotor Activity:  Normal  Concentration:  Good  Recall:  Good  Fund of Knowledge:Good  Language: Good  Akathisia:  Negative  Handed:  Right  AIMS (if indicated):     Assets:  Communication Skills Housing  Sleep:     Cognition: WNL  ADL's:  Intact   Mental Status Per Nursing Assessment::   On Admission:     Demographic Factors:  Caucasian and Living alone  Loss Factors: NA  Historical Factors: NA  Risk Reduction Factors:   NA  Continued Clinical Symptoms:  depression  Cognitive Features That Contribute To Risk:  None    Suicide Risk:  Minimal: No identifiable suicidal ideation.  Patients presenting with no risk factors but with morbid ruminations; may be classified as minimal risk based on the severity of the depressive symptoms    Plan Of Care/Follow-up recommendations:  Activity:  continue current activity Diet:  continue current  diet  Carolanne Grumbling, MD 09/23/2015, 11:56 AM

## 2015-09-23 NOTE — ED Notes (Signed)
IVC has been rescinded per TTS, pt to be dc'd home

## 2015-09-23 NOTE — ED Notes (Signed)
Pt is aware that the  CSW has scheduled her an appt with a medical MD, and they have provided a bus pass.

## 2015-09-23 NOTE — Progress Notes (Signed)
ED CM consulted by TTS about pt pcp EPIC does not list one and informed [facility] staff she would follow up with her pcp  EPIC indicates this is medicare pt first [facility] ED/Hospital visit

## 2015-09-23 NOTE — Progress Notes (Signed)
Pcp is [employer]. Orest Dikes, MD [address] [place], [place] [address] [phone] CM spoke with pt and she prefers an appt at 1330 first 2 weeks of March 2017  EPIC updated with pcp

## 2015-09-23 NOTE — Progress Notes (Signed)
enterd in d/c instructions  And pt agreed to  Dr Elbert Ewings Kathlene November On 10/04/2015 You have a scheduled appt on 10/04/15 at 1230 If you can not make this appt please call to reschedule [address]  [place], [place] [address]  [phone] [facility] RN updated

## 2015-09-23 NOTE — Progress Notes (Signed)
ED CM spoke with Meghan Hensley at [phone] States pcp only able to see pt at 1230 for hospital follow up appts  CM discussed this with pt who agrees to a 1230 appt if on week of March 6-13 2017  Pending return call from [place] to provide appt to CM Left CM mobile # and [facility] ED main # for at return call

## 2015-09-23 NOTE — ED Notes (Signed)
Talking w/ CSW 

## 2015-10-01 ENCOUNTER — Encounter (HOSPITAL_COMMUNITY): Payer: Self-pay | Admitting: *Deleted

## 2015-10-01 ENCOUNTER — Emergency Department (HOSPITAL_COMMUNITY)
Admission: EM | Admit: 2015-10-01 | Discharge: 2015-10-01 | Disposition: A | Discharge status: Home / Self Care | Payer: Medicare Other | Attending: Emergency Medicine | Admitting: Emergency Medicine

## 2015-10-01 DIAGNOSIS — N946 Dysmenorrhea, unspecified: Secondary | ICD-10-CM | POA: Diagnosis not present

## 2015-10-01 DIAGNOSIS — Z8659 Personal history of other mental and behavioral disorders: Secondary | ICD-10-CM | POA: Insufficient documentation

## 2015-10-01 DIAGNOSIS — R109 Unspecified abdominal pain: Secondary | ICD-10-CM | POA: Diagnosis present

## 2015-10-01 DIAGNOSIS — E039 Hypothyroidism, unspecified: Secondary | ICD-10-CM | POA: Diagnosis not present

## 2015-10-01 DIAGNOSIS — Z79899 Other long term (current) drug therapy: Secondary | ICD-10-CM | POA: Insufficient documentation

## 2015-10-01 LAB — CBC WITH DIFFERENTIAL/PLATELET
BASOS ABS: 0 10*3/uL (ref 0.0–0.1)
Basophils Relative: 0 %
Eosinophils Absolute: 0.1 10*3/uL (ref 0.0–0.7)
Eosinophils Relative: 1 %
HEMATOCRIT: 38.6 % (ref 36.0–46.0)
Hemoglobin: 12.4 g/dL (ref 12.0–15.0)
LYMPHS ABS: 1.8 10*3/uL (ref 0.7–4.0)
LYMPHS PCT: 19 %
MCH: 29.4 pg (ref 26.0–34.0)
MCHC: 32.1 g/dL (ref 30.0–36.0)
MCV: 91.5 fL (ref 78.0–100.0)
MONO ABS: 0.3 10*3/uL (ref 0.1–1.0)
MONOS PCT: 3 %
NEUTROS ABS: 7 10*3/uL (ref 1.7–7.7)
Neutrophils Relative %: 77 %
Platelets: 306 10*3/uL (ref 150–400)
RBC: 4.22 MIL/uL (ref 3.87–5.11)
RDW: 15.7 % — AB (ref 11.5–15.5)
WBC: 9.2 10*3/uL (ref 4.0–10.5)

## 2015-10-01 LAB — BASIC METABOLIC PANEL
ANION GAP: 12 (ref 5–15)
BUN: 12 mg/dL (ref 6–20)
CHLORIDE: 105 mmol/L (ref 101–111)
CO2: 22 mmol/L (ref 22–32)
Calcium: 9.5 mg/dL (ref 8.9–10.3)
Creatinine, Ser: 1.09 mg/dL — ABNORMAL HIGH (ref 0.44–1.00)
GFR calc Af Amer: >60 mL/min (ref >60)
GFR calc non Af Amer: >60 mL/min (ref >60)
GLUCOSE: 94 mg/dL (ref 65–99)
POTASSIUM: 3.8 mmol/L (ref 3.5–5.1)
Sodium: 139 mmol/L (ref 135–145)

## 2015-10-01 NOTE — ED Provider Notes (Signed)
CSN: [id number]     Arrival date & time 10/01/15  1252 History   First MD Initiated Contact with Patient 10/01/15 1821     Chief Complaint  Patient presents with  . Abdominal Cramping     HPI   Ms. Meghan Hensley is an 45 y.o. female with history of bipolar, schizophrenia, hypothyroidism who presents to the ED for evaluation of heavy menstrual cycle. [facility]. She states she felt better at the time of discharge, though still not 100%. She states that she started her menstrual cycle three days ago and her periods have been heavy, which can be normal for. However, she states she feels very anemic. When asked to clarify what she means she states "I just feel very anemic, and I feel like I cannot handle taking my thyroid medicine and my maxi pads at the same time." She states that she has been using about 5-10 pads daily. She has some tangential and rambling speech but with redirection she denies feeling dizzy or weak. She states she had cramping the past couple of days but none today. She denies SI/HI, AH/VH. However, she states that if we send her home she feels that she is "so anemic" that she will not be able to manage herself and she thinks she will be hit by a bus.   Past Medical History  Diagnosis Date  . Bipolar 1 disorder (HCC)   . Schizophrenia (HCC)   . Thyroid disease    History reviewed. No pertinent past surgical history. No family history on file. Social History  Substance Use Topics  . Smoking status: Never Smoker   . Smokeless tobacco: None  . Alcohol Use: No   OB History    No data available     Review of Systems  All other systems reviewed and are negative.     Allergies  Review of patient's allergies indicates no known allergies.  Home Medications   Prior to Admission medications   Medication Sig Start Date End Date Taking? Authorizing Provider  b complex vitamins tablet Take 1 tablet by mouth daily.   Yes Historical Provider, MD   ibuprofen (ADVIL,MOTRIN) 200 MG tablet Take 600-800 mg by mouth every 6 (six) hours as needed for headache or moderate pain.   Yes Historical Provider, MD  levothyroxine (SYNTHROID, LEVOTHROID) 200 MCG tablet Take 200 mcg by mouth daily. 09/07/15  Yes Historical Provider, MD   BP 126/69 mmHg  Pulse 83  Temp(Src) 97.4 F (36.3 C) (Oral)  Resp 16  SpO2 96%  LMP 09/29/2015 Physical Exam  Constitutional: She is oriented to person, place, and time.  HENT:  Right Ear: External ear normal.  Left Ear: External ear normal.  Nose: Nose normal.  Mouth/Throat: Oropharynx is clear and moist. No oropharyngeal exudate.  Eyes: Conjunctivae and EOM are normal. Pupils are equal, round, and reactive to light.  Neck: Normal range of motion. Neck supple.  Cardiovascular: Normal rate, regular rhythm, normal heart sounds and intact distal pulses.   Pulmonary/Chest: Effort normal and breath sounds normal. No respiratory distress. She has no wheezes. She exhibits no tenderness.  Abdominal: Soft. Bowel sounds are normal. She exhibits no distension. There is no tenderness. There is no rebound and no guarding.  Musculoskeletal: She exhibits no edema.  Neurological: She is alert and oriented to person, place, and time. No cranial nerve deficit or sensory deficit. Coordination and gait normal.  Skin: Skin is warm and dry. No pallor.  Psychiatric:  Odd  affect. Slow, drawn out speech. At times speech is tangential and rambling.   Nursing note and vitals reviewed.  Filed Vitals:   10/01/15 1309 10/01/15 1941  BP: 126/69 130/73  Pulse: 83 64  Temp: 97.4 F (36.3 C)   TempSrc: Oral   Resp: 16 18  SpO2: 96% 100%     ED Course  Procedures (including critical care time) Labs Review Labs Reviewed  BASIC METABOLIC PANEL - Abnormal; Notable for the following:    Creatinine, Ser 1.09 (*)    All other components within normal limits  CBC WITH DIFFERENTIAL/PLATELET - Abnormal; Notable for the following:    RDW  15.7 (*)    All other components within normal limits    Imaging Review No results found. I have personally reviewed and evaluated these images and lab results as part of my medical decision-making.   EKG Interpretation None      MDM   Final diagnoses:  Dysmenorrhea  Hypothyroidism, unspecified hypothyroidism type    Labs unremarkable. Pt has a nonfocal exam. She does clearly have some odd behavior/speech though is not actively psychotic, delusional, suicidal, or homicidal. I discussed at length with her that she is not currently anemic, VSS, nonfocal exam. I suspect missing her Synthroid has contributed to her "feeling anemic." Strongly encouraged her to take medication as prescribed. Resource guide given to establish PCP For f/u. BH resource guide given. ER return precautions given.   Carlene Coria, PA-C 10/01/15 2049  Melene Plan, DO 10/01/15 2049  Melene Plan, DO 10/01/15 2142

## 2015-10-01 NOTE — ED Notes (Signed)
[place], [place] and Melene Planan Floyd, MD notified that pt wanted to speak with them. Providers verify pt can be discharged

## 2015-10-01 NOTE — ED Notes (Signed)
Patient was discharged but patient does not want to leave because she said she cant take care of herself. Patient is moving all extremities and is very appropriate.

## 2015-10-01 NOTE — Discharge Instructions (Signed)
You were seen in the ED for evaluation of possible anemia due to your menstrual cycle. You are not anemic today. Your bloodwork was normal. I suspect missing your thyroid medicine might be contributing to how you feel. Please make sure to take your thyroid medicine every day as prescribed.   Take medications as prescribed. Return to the emergency room for worsening condition or new concerning symptoms. Follow up with your regular doctor. If you don't have a regular doctor use one of the numbers below to establish a primary care doctor.   Emergency Department Resource Guide 1) Find a Doctor and Pay Out of Pocket Although you won't have to find out who is covered by your insurance plan, it is a good idea to ask around and get recommendations. You will then need to call the office and see if the doctor you have chosen will accept you as a new patient and what types of options they offer for patients who are self-pay. Some doctors offer discounts or will set up payment plans for their patients who do not have insurance, but you will need to ask so you aren't surprised when you get to your appointment.  2) Contact Your Local Health Department Not all health departments have doctors that can see patients for sick visits, but many do, so it is worth a call to see if yours does. If you don't know where your local health department is, you can check in your phone book. The CDC also has a tool to help you locate your state's health department, and many state websites also have listings of all of their local health departments.  3) Find a Walk-in Clinic If your illness is not likely to be very severe or complicated, you may want to try a walk in clinic. These are popping up all over the country in pharmacies, drugstores, and shopping centers. They're usually staffed by nurse practitioners or physician assistants that have been trained to treat common illnesses and complaints. They're usually fairly quick and  inexpensive. However, if you have serious medical issues or chronic medical problems, these are probably not your best option.  No Primary Care Doctor: - Call Health Connect at  [phone] - they can help you locate a primary care doctor that  accepts your insurance, provides certain services, etc. - Physician Referral [phone]  Emergency Department Resource Guide 1) Find a Doctor and Pay Out of Pocket Although you won't have to find out who is covered by your insurance plan, it is a good idea to ask around and get recommendations. You will then need to call the office and see if the doctor you have chosen will accept you as a new patient and what types of options they offer for patients who are self-pay. Some doctors offer discounts or will set up payment plans for their patients who do not have insurance, but you will need to ask so you aren't surprised when you get to your appointment.  2) Contact Your Local Health Department Not all health departments have doctors that can see patients for sick visits, but many do, so it is worth a call to see if yours does. If you don't know where your local health department is, you can check in your phone book. The CDC also has a tool to help you locate your state's health department, and many state websites also have listings of all of their local health departments.  3) Find a Walk-in Clinic If your illness is not  likely to be very severe or complicated, you may want to try a walk in clinic. These are popping up all over the country in pharmacies, drugstores, and shopping centers. They're usually staffed by nurse practitioners or physician assistants that have been trained to treat common illnesses and complaints. They're usually fairly quick and inexpensive. However, if you have serious medical issues or chronic medical problems, these are probably not your best option.  No Primary Care Doctor: - Call Health Connect at  [phone] - they can  help you locate a primary care doctor that  accepts your insurance, provides certain services, etc. - Physician Referral Service- [phone]  Chronic Pain Problems: Organization         Address  Phone   Notes  [facility]  [phone]) [phone] Patients need to be referred by their primary care doctor.   Medication Assistance: Organization         Address  Phone   Notes  [facility] Medication [facility] [address] [place], [place] [address] [phone]) [phone] --Must be a resident of [facility] [place] -- Must have NO insurance coverage whatsoever (no Medicaid/ Medicare, etc.) -- The pt. MUST have a primary care doctor that directs their care regularly and follows them in the community   MedAssist  [phone]   [employer]  [phone]) [phone]    Agencies that provide inexpensive medical care: Organization         Address  Phone   Notes  [facility]  [phone]   [facility]    [phone]   [facility] [address] [place], [place] [address] [phone]   [facility] [address], [place] [phone]   [facility]    [phone]   [facility]    [phone]   [facility]  [address], [place] Phone:  [phone], Fax:  [phone] Hours of Operation:  9 am - 6 pm, M-F.  Also accepts Medicaid/Medicare and self-pay.  [facility]  [address], [place] Phone: [phone], Fax: [phone]. Hours of Operation:  8:30 am - 5:30 pm, M-F.  Also accepts Medicaid and self-pay.  [facility] [place] [address], [place] Phone: [phone](336) [phone]   [facility] [address], [place], [place] [phone], Ext. [phone] Mondays & Thursdays: 7-9 AM.  First 15 patients are seen on a first come, first serve basis.    Medicaid-accepting [facility] Providers:  Organization         Address  Phone   Notes  [facility] [address], [place] [phone]) [phone] Also accepts self-pay patients.  [facility] [address], [place]  [phone]   [facility] [address], [place] [phone]   [facility] [address] [place] [address], [place] [phone]) [phone]   Renaye RakersVeita Bland [address], [place]   [phone]) [phone] Only accepts [place] patients after they have their name applied to their card.   Self-Pay (no insurance) in [facility]:  Organization         Address  Phone   Notes  Sickle Cell Patients, [facility] [address], [place] [phone]   [facility]  [facility] [address], [place] [phone]   [facility]  [address], [place] [phone]) [phone]   [facility]/Dr. Osei-Bonsu  [address] [place] [address], [place] or [phone], [place] [phone] Phone number for both [place] and [place] locations is the same.  [facility] [address], [place] [phone]   [facility] [address] [place] [address], [place] or [address] [phone]   [facility] [address], [place] [phone], phone; [phone], fax Sees patients 1st and 3rd Saturday of every month.  Must not qualify for public or private insurance (i.e. Medicaid, Medicare, [place] Health Choice, Veterans' Benefits)  Household income should be no more than 200% of the poverty level The clinic cannot treat you if you are pregnant or think you are pregnant  Sexually transmitted diseases are not treated at the clinic.

## 2015-10-01 NOTE — ED Notes (Signed)
Pt states she started her cycle, weak and heavy bleeding, rambling in speech.

## 2015-10-01 NOTE — ED Notes (Signed)
Bed: WA04 Expected date:  Expected time:  Means of arrival:  Comments: Triage 

## 2016-02-16 ENCOUNTER — Encounter (HOSPITAL_COMMUNITY): Payer: Self-pay | Admitting: *Deleted

## 2016-02-16 ENCOUNTER — Emergency Department (HOSPITAL_COMMUNITY)
Admission: EM | Admit: 2016-02-16 | Discharge: 2016-02-17 | Disposition: A | Discharge status: Other Acute Inpatient Hospital | Payer: Medicare Other | Attending: Emergency Medicine | Admitting: Emergency Medicine

## 2016-02-16 DIAGNOSIS — F250 Schizoaffective disorder, bipolar type: Secondary | ICD-10-CM | POA: Diagnosis present

## 2016-02-16 DIAGNOSIS — Z791 Long term (current) use of non-steroidal anti-inflammatories (NSAID): Secondary | ICD-10-CM | POA: Insufficient documentation

## 2016-02-16 DIAGNOSIS — F209 Schizophrenia, unspecified: Secondary | ICD-10-CM | POA: Insufficient documentation

## 2016-02-16 DIAGNOSIS — F319 Bipolar disorder, unspecified: Secondary | ICD-10-CM | POA: Diagnosis not present

## 2016-02-16 DIAGNOSIS — T673XXA Heat exhaustion, anhydrotic, initial encounter: Secondary | ICD-10-CM | POA: Diagnosis present

## 2016-02-16 DIAGNOSIS — E039 Hypothyroidism, unspecified: Secondary | ICD-10-CM | POA: Diagnosis not present

## 2016-02-16 DIAGNOSIS — F251 Schizoaffective disorder, depressive type: Secondary | ICD-10-CM

## 2016-02-16 NOTE — ED Notes (Signed)
Pt arrives to the ER via EMS for complaints of being in the heat walking around today; pt states that she was walking around looking for gas; pt states that she spent the majority of the day outside; pt states that she has a place to live and that [facility] [place] [facility] [place] the [place]; pt states that her father died approx a year ago from cancer and that she cannot live with her mother in the area because the house has cancer and is toxic; pt states that she feels like a neglected and abandoned person because she has to cook food and provide care for herself and that there is no one there to take care of her; pt states that she is an only child and feels like she has no one that she can unload her feelings on; pt denies SI / HI; pt states that she has mild depression; pt states that she feels all this comes from her hypothyroidism and that there is no one there to help her take her medication; pt states that she has been out of her medication for a week but [facility] but has not got it filled; pt states that she has no reason why she isn't taking her medication other than that she doesn't have anyone to cook her the right foods to take with it; pt states "I feel like I need more robust food and I don't have anyone to cook for me"; pt states that she has had generalized weakness for the last 3 years and wants to know what is causing that

## 2016-02-17 ENCOUNTER — Inpatient Hospital Stay (HOSPITAL_COMMUNITY)
Admission: AD | Admit: 2016-02-17 | Discharge: 2016-02-25 | DRG: 885 | Disposition: A | Discharge status: Home / Self Care | Payer: Medicare Other | Source: Intra-hospital | Attending: Psychiatry | Admitting: Psychiatry

## 2016-02-17 ENCOUNTER — Encounter (HOSPITAL_COMMUNITY): Payer: Self-pay | Admitting: *Deleted

## 2016-02-17 DIAGNOSIS — F209 Schizophrenia, unspecified: Secondary | ICD-10-CM | POA: Diagnosis not present

## 2016-02-17 DIAGNOSIS — E039 Hypothyroidism, unspecified: Secondary | ICD-10-CM | POA: Diagnosis present

## 2016-02-17 DIAGNOSIS — Z915 Personal history of self-harm: Secondary | ICD-10-CM | POA: Diagnosis not present

## 2016-02-17 DIAGNOSIS — Z79899 Other long term (current) drug therapy: Secondary | ICD-10-CM

## 2016-02-17 DIAGNOSIS — R600 Localized edema: Secondary | ICD-10-CM | POA: Diagnosis present

## 2016-02-17 DIAGNOSIS — F250 Schizoaffective disorder, bipolar type: Secondary | ICD-10-CM | POA: Diagnosis present

## 2016-02-17 DIAGNOSIS — F315 Bipolar disorder, current episode depressed, severe, with psychotic features: Secondary | ICD-10-CM | POA: Diagnosis not present

## 2016-02-17 DIAGNOSIS — F314 Bipolar disorder, current episode depressed, severe, without psychotic features: Secondary | ICD-10-CM | POA: Diagnosis not present

## 2016-02-17 DIAGNOSIS — F319 Bipolar disorder, unspecified: Secondary | ICD-10-CM | POA: Diagnosis present

## 2016-02-17 DIAGNOSIS — F419 Anxiety disorder, unspecified: Secondary | ICD-10-CM | POA: Diagnosis present

## 2016-02-17 LAB — CBC
HCT: 33 % — ABNORMAL LOW (ref 36.0–46.0)
Hemoglobin: 10.7 g/dL — ABNORMAL LOW (ref 12.0–15.0)
MCH: 28.8 pg (ref 26.0–34.0)
MCHC: 32.4 g/dL (ref 30.0–36.0)
MCV: 88.9 fL (ref 78.0–100.0)
Platelets: 280 10*3/uL (ref 150–400)
RBC: 3.71 MIL/uL — ABNORMAL LOW (ref 3.87–5.11)
RDW: 15.2 % (ref 11.5–15.5)
WBC: 11.3 10*3/uL — ABNORMAL HIGH (ref 4.0–10.5)

## 2016-02-17 LAB — COMPREHENSIVE METABOLIC PANEL
ALBUMIN: 3.8 g/dL (ref 3.5–5.0)
ALT: 34 U/L (ref 14–54)
AST: 29 U/L (ref 15–41)
Alkaline Phosphatase: 80 U/L (ref 38–126)
Anion gap: 8 (ref 5–15)
BILIRUBIN TOTAL: 0.9 mg/dL (ref 0.3–1.2)
BUN: 12 mg/dL (ref 6–20)
CHLORIDE: 103 mmol/L (ref 101–111)
CO2: 27 mmol/L (ref 22–32)
Calcium: 9.1 mg/dL (ref 8.9–10.3)
Creatinine, Ser: 1.19 mg/dL — ABNORMAL HIGH (ref 0.44–1.00)
GFR calc Af Amer: >60 mL/min (ref >60)
GFR, EST NON AFRICAN AMERICAN: 55 mL/min — AB (ref >60)
GLUCOSE: 93 mg/dL (ref 65–99)
POTASSIUM: 3.5 mmol/L (ref 3.5–5.1)
Sodium: 138 mmol/L (ref 135–145)
TOTAL PROTEIN: 7.8 g/dL (ref 6.5–8.1)

## 2016-02-17 LAB — T4, FREE: Free T4: 0.43 ng/dL — ABNORMAL LOW (ref 0.61–1.12)

## 2016-02-17 LAB — TSH: TSH: >90 u[IU]/mL — ABNORMAL HIGH (ref 0.350–4.500)

## 2016-02-17 LAB — ETHANOL: Alcohol, Ethyl (B): <5 mg/dL (ref <5)

## 2016-02-17 MED ORDER — GABAPENTIN 100 MG PO CAPS
200.0000 mg | ORAL_CAPSULE | Freq: Two times a day (BID) | ORAL | Status: DC
  Administered 2016-02-17 – 2016-02-25 (×14): 200 mg via ORAL
  Filled 2016-02-17 (×12): qty 2
  Filled 2016-02-17: qty 28
  Filled 2016-02-17 (×3): qty 2
  Filled 2016-02-17: qty 28
  Filled 2016-02-17 (×5): qty 2

## 2016-02-17 MED ORDER — HYDROXYZINE HCL 25 MG PO TABS: 25.0000 mg | ORAL_TABLET | Freq: Four times a day (QID) | ORAL | Status: DC | PRN

## 2016-02-17 MED ORDER — LEVOTHYROXINE SODIUM 200 MCG PO TABS
200.0000 ug | ORAL_TABLET | Freq: Every day | ORAL | Status: DC
  Administered 2016-02-17: 200 ug via ORAL
  Filled 2016-02-17: qty 1

## 2016-02-17 MED ORDER — HYDROXYZINE HCL 25 MG PO TABS
25.0000 mg | ORAL_TABLET | Freq: Four times a day (QID) | ORAL | Status: DC | PRN
  Administered 2016-02-17 – 2016-02-25 (×3): 25 mg via ORAL
  Filled 2016-02-17 (×4): qty 1

## 2016-02-17 MED ORDER — ALUM & MAG HYDROXIDE-SIMETH 200-200-20 MG/5ML PO SUSP
30.0000 mL | ORAL | Status: DC | PRN
  Administered 2016-02-22 – 2016-02-24 (×3): 30 mL via ORAL
  Filled 2016-02-17 (×3): qty 30

## 2016-02-17 MED ORDER — LEVOTHYROXINE SODIUM 200 MCG PO TABS
200.0000 ug | ORAL_TABLET | Freq: Every day | ORAL | Status: DC
  Administered 2016-02-18 – 2016-02-21 (×4): 200 ug via ORAL
  Filled 2016-02-17 (×4): qty 1
  Filled 2016-02-17: qty 2
  Filled 2016-02-17 (×2): qty 1

## 2016-02-17 MED ORDER — TRAZODONE HCL 100 MG PO TABS
100.0000 mg | ORAL_TABLET | Freq: Every evening | ORAL | Status: DC | PRN
Start: 2016-02-17 — End: 2016-02-18
  Administered 2016-02-17: 100 mg via ORAL
  Filled 2016-02-17: qty 1

## 2016-02-17 MED ORDER — ACETAMINOPHEN 325 MG PO TABS
650.0000 mg | ORAL_TABLET | Freq: Four times a day (QID) | ORAL | Status: DC | PRN
  Administered 2016-02-21: 650 mg via ORAL
  Filled 2016-02-17: qty 2

## 2016-02-17 MED ORDER — FLUOXETINE HCL 10 MG PO CAPS
10.0000 mg | ORAL_CAPSULE | Freq: Every day | ORAL | Status: DC
Start: 2016-02-17 — End: 2016-02-17
  Administered 2016-02-17: 10 mg via ORAL
  Filled 2016-02-17: qty 1

## 2016-02-17 MED ORDER — MAGNESIUM HYDROXIDE 400 MG/5ML PO SUSP: 30.0000 mL | Freq: Every day | ORAL | Status: DC | PRN

## 2016-02-17 MED ORDER — GABAPENTIN 100 MG PO CAPS
200.0000 mg | ORAL_CAPSULE | Freq: Two times a day (BID) | ORAL | Status: DC
  Administered 2016-02-17: 200 mg via ORAL
  Filled 2016-02-17: qty 2

## 2016-02-17 MED ORDER — FLUOXETINE HCL 10 MG PO CAPS
10.0000 mg | ORAL_CAPSULE | Freq: Every day | ORAL | Status: DC
  Administered 2016-02-18: 10 mg via ORAL
  Filled 2016-02-17 (×4): qty 1

## 2016-02-17 MED ORDER — TRAZODONE HCL 100 MG PO TABS: 100.0000 mg | ORAL_TABLET | Freq: Every evening | ORAL | Status: DC | PRN

## 2016-02-17 NOTE — BH Assessment (Signed)
[facility] Assessment Progress Note  Per Thedore MinsMojeed Akintayo, MD, this pt requires psychiatric hospitalization at this time.  Lillia AbedLindsay, RN, [facility] pt [facility]  [facility] Rm 403-1.  Pt has signed Voluntary Admission and Consent for Treatment, as well as Consent to Release Information to no one, and signed forms have been faxed to [facility].  Pt's nurse, Diane, has been notified, and agrees to send original paperwork along with pt via [place], and to call report to [phone].  Doylene Canninghomas Sendy Pluta, MA Triage Specialist [phone]

## 2016-02-17 NOTE — ED Provider Notes (Signed)
CSN: [id number]     Arrival date & time 02/16/16  2256 History   First MD Initiated Contact with Patient 02/17/16 0011     Chief Complaint  Patient presents with  . Heat Exposure     (Consider location/radiation/quality/duration/timing/severity/associated sxs/prior Treatment) The history is provided by the patient. The history is limited by the condition of the patient.  Patient with hx schizophrenia, bipolar disorder, presents via ems.   Patient very difficult historian, w tangential answers to most questions - level 5 caveat, re psychiatric illness.  Patient seems fixated on 'nobody helping to care for her...Marland Kitchen.Marland Kitchen.Marland Kitchen.not being able to take her thyroid medication regularly due to nobody there to make good food for her...Marland Kitchen.Marland Kitchen.Marland Kitchen.having to avoid [place]...Marland Kitchen.Marland Kitchen.Marland Kitchen.her car periodically running out of gas and the her having to walk - patient just keeps repeating these thoughts over and over.'.  Patient denies specific physical c/o. No fever or chills. No focal pain. Denies SI., but then does say feels depressed.         Past Medical History  Diagnosis Date  . Bipolar 1 disorder (HCC)   . Schizophrenia (HCC)   . Thyroid disease    History reviewed. No pertinent past surgical history. No family history on file. Social History  Substance Use Topics  . Smoking status: Never Smoker   . Smokeless tobacco: None  . Alcohol Use: No   OB History    No data available     Review of Systems  Constitutional: Negative for fever.  HENT: Negative for sore throat.   Eyes: Negative for visual disturbance.  Respiratory: Negative for shortness of breath.   Cardiovascular: Negative for chest pain.  Gastrointestinal: Negative for vomiting and abdominal pain.  Genitourinary: Negative for flank pain.  Musculoskeletal: Negative for back pain and neck pain.  Skin: Negative for rash.  Neurological: Negative for headaches.  Hematological: Does not bruise/bleed easily.  Psychiatric/Behavioral: Positive for dysphoric  mood. The patient is nervous/anxious.       Allergies  Review of patient's allergies indicates no known allergies.  Home Medications   Prior to Admission medications   Medication Sig Start Date End Date Taking? Authorizing Provider  ibuprofen (ADVIL,MOTRIN) 200 MG tablet Take 600-800 mg by mouth every 6 (six) hours as needed for headache or moderate pain.   Yes Historical Provider, MD  levothyroxine (SYNTHROID, LEVOTHROID) 200 MCG tablet Take 200 mcg by mouth daily. 09/07/15  Yes Historical Provider, MD   BP 116/61 mmHg  Pulse 82  Temp(Src) 98.3 F (36.8 C) (Oral)  Resp 20  SpO2 96% Physical Exam  Constitutional: She appears well-developed and well-nourished. No distress.  HENT:  Head: Atraumatic.  Eyes: Conjunctivae are normal. No scleral icterus.  Neck: Neck supple. No tracheal deviation present. No thyromegaly present.  Cardiovascular: Normal rate, regular rhythm, normal heart sounds and intact distal pulses.   Pulmonary/Chest: Effort normal and breath sounds normal. No respiratory distress. She exhibits no tenderness.  Abdominal: Soft. Normal appearance and bowel sounds are normal. She exhibits no distension. There is no tenderness.  obese  Musculoskeletal: She exhibits no tenderness.  Neurological: She is alert.  Speech clear, fluent. Ambulates w steady gait.   Skin: Skin is warm and dry. No rash noted. She is not diaphoretic.  Psychiatric:  Unusual affect. Depressed mood. Tends to move from one unrelated thought to next, repeating same phrases over and over, little insight into current symptoms, her history, and how she came to be at ED.   Nursing note and vitals reviewed.  ED Course  Procedures (including critical care time) Labs Review     I have personally reviewed and evaluated these images and lab results as part of my medical decision-making.   MDM   Labs sent.  Behavioral health team consulted.  Reviewed nursing notes and prior charts for additional  history.   Suspect poorly compensated mental illness/bipolar/schizophrenia, currently not taking any behavioral health meds - patient needs psychiatric assessment.  Labs pending - signed out to Dr Clarene Duke to check labs when back.  Disposition per [facility] team.       Cathren Laine, MD 02/17/16 [phone]

## 2016-02-17 NOTE — Tx Team (Signed)
Initial Interdisciplinary Treatment Plan   PATIENT STRESSORS: Medication change or noncompliance   PATIENT STRENGTHS: Ability for insight Communication skills Motivation for treatment/growth   PROBLEM LIST: Problem List/Patient Goals Date to be addressed Date deferred Reason deferred Estimated date of resolution  Depression 02/17/2016  02/17/2016   D/C  Anxiety 02/17/2016  02/17/2016   D/C  "Getting stabilized on my meds" 02/17/2016  02/17/2016   D/C  "feeling better" 02/17/2016  02/17/2016   D/C                                 DISCHARGE CRITERIA:  Ability to meet basic life and health needs Adequate post-discharge living arrangements Improved stabilization in mood, thinking, and/or behavior Medical problems require only outpatient monitoring Motivation to continue treatment in a less acute level of care Need for constant or close observation no longer present Verbal commitment to aftercare and medication compliance  PRELIMINARY DISCHARGE PLAN: Outpatient therapy Return to previous living arrangement  PATIENT/FAMIILY INVOLVEMENT: This treatment plan has been presented to and reviewed with the patient, Meghan Hensley.  The patient and family have been given the opportunity to ask questions and make suggestions.  Larry SierrasMiddleton, Vincie Linn P 02/17/2016, 8:51 PM

## 2016-02-17 NOTE — Progress Notes (Signed)
Patient attended karaoke group tonight.  

## 2016-02-17 NOTE — ED Notes (Signed)
Pt has been calm and cooperative on the unit. Affect blunted, mood depressed, behavior appropriate. She said that she has had difficulty taking care of herself since her father's death a year ago. She has been out in the sun because of running out of gas, and has sunburn on her feet. [facility] by [employer]. All belongings returned to pt who signed for same.

## 2016-02-17 NOTE — Progress Notes (Signed)
D Pt. Denies SI and HI, no complaints of pain or discomfort noted at present time.  A Writer offered support and encouragement,  Discussed reason for admission.  R Pt. Tangential, does not give a direct answer to writers questions.  Fixated on the fact that she does not have anyone to take care of her and help her cook and take her medications.  Pt. States she walks a lot because she runs out of gas.  Pt. Remains safe on the unit,

## 2016-02-17 NOTE — Progress Notes (Signed)
Patient ID: Concha SeKaren Carapia, female   DOB: 11/26/1970, 45 y.o.   MRN: [id number] Per State regulations 482.30 this chart was reviewed for medical necessity with respect to the patient's admission/duration of stay.    Next review date: 02/21/16  Thurman CoyerEric Nezar Buckles, BSN, RN-BC  Case Manager

## 2016-02-17 NOTE — Progress Notes (Addendum)
Admission Note:  45 year old female who presents voluntary, in no acute distress, for the treatment of altered mental status.  Patient reports "feeling down", "decreased appetite", "I have the blues", and "haven't been taking my medicines".  Patient reports depressed symptoms are "triggered from heat stroke".  Patient reports that she has been forgetful lately and believes something is wrong with her gas tank.  Patient states "As soon as I put gas in my car, I go a few miles and have to put more gas in it. I think I have a hole in my tank. I always end up having to walk miles in the sun for gas and have heat strokes".  Patient appears flat, depressed, and is slow to process and respond. Patient presents with tangential thinking. Patient was calm and cooperative with admission process. Patient currently denies SI and contracts for safety upon admission. Patient denies AVH on admission.  Patient reports main stressor as father's death last year.  Patient verbalizes feeling alone and "high risk". Patient states "no one checks up on me and I don't have conversation with other people so if something happened to me no one would know. I just have the blues".  [facility] patient would like to work on "getting stabilized on my medications" and "feeling better".  Skin was assessed and found to be clear of any abnormal marks apart from cracked, dry heels bilateral and surgical scars to left arm and right hip. Patient searched and no contraband found, POC and unit policies explained and understanding verbalized. Consents obtained. Food and fluids offered and accepted. Patient had no additional questions or concerns.

## 2016-02-17 NOTE — Progress Notes (Addendum)
Pcp is Meghan BrothersLawrence S. Orest DikesMoffatt Jr, MD [address] [place], [place] [address] [phone]  Entered in d/c intructions  Dr Albin FischerMoffatt Schedule an appointment as soon as possible for a visit As needed Pcp is Meghan BrothersLawrence S. Orest DikesMoffatt Jr, MD [address] [place], [place] [id number] [phone]

## 2016-02-17 NOTE — BH Assessment (Addendum)
Tele Assessment Note   Meghan Hensley is an 45 y.o. single female who came to the [facility] voluntarily via EMS tonight with an altered mental status.  Pt's unclear thinking, depression symptoms combined with her delusional explanations seem symptomatic of schizoaffective disorder which she has been previously diagnosed. Pt sts she has been "feeling down" ever since her father death last year for a terminal illness. Pt sts she is not taking care of herself very well and does not have the support she needs. 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 Pt sts she has not been taking her prescribed medications regularly. Pt sts that her PCP at [facility] is prescribing her psychiatric medications and she sts she is not seeing a therapist. Pt denies SI, HI, SHI and AVH. Pt later described "hearing people praying" often but described the experience as a spiritual one, not a hallucination. Pt sts that she is having trouble remembering to take care of her business affairs, such as paying her cell phone bill or putting gas in her car. As a result, pt sts she has spent a number of times this month walking in the heat to get gas for her car which has run out. Pt sts she is "not thinking clearly." Pt sts that her psychiatric diagnosis stems from her thyroid disorder and is primarily treated with Synthyroid. Pt sts that she has had 2 "nervous breakdowns" in the past which resulted in suicide attempts. Pt is vague on details of these episodes. Pt sts her last psychiatric hospitalization was more than 15 years ago in [place]. Pt sts that she has seen a therapist several times (several session) about her thyroid but it did not help so she stopped going. Pt denied any hx of anger outbursts or aggression in her past.   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 Pt sts she completed a BA degree. Pt sts she had one marriage which was annulled after a short period of time and has no children. Pt sts she is an only child and she although she sts she blieves her mother is still alive, they "do not see eye to eye" so they only communicate a few times a year. Pt sts that sh has been very stressed this past year in helping to handle her father's estate in probate. Pt sts that she has felt "very alone and unsupported" since her father died of a terminal illness last June. Pt sts that she has been thrown by the anniversary of his death, heat stroke from walking so often in the hot summer sun and her thyroid condition. Pt sts she sleeps only about 2-3 hours at night and sts she "has not eaten regularly since the day dad died."  Pt sts she eats a lot of "junk food" and no "real meals." Pt denied any hx of physical, verbal/emotional or sexual abuse. Pt denied any legal hx, past or present.  Pt sts she has drunk wine with dinner since she was a child as her parents are [place] but, she does not actually drink alcohol often, about 2-3 times per month. Pt denies any other substance use.  When tested in the ED tonight, pt's BAL is <5 but her UDS results are incomplete at this time. Symptoms of depression include deep  sadness, fatigue, excessive guilt, decreased self esteem, tearfulness & crying spells, self isolation, lack of motivation for activities and pleasure, irritability, negative outlook, difficulty thinking & concentrating, feeling helpless and hopeless, sleep and eating disturbances. Pt sts she does have panic attacks but adds that she has only had 2-3 in her lifetime.   Pt was dressed in scrubs and sitting on her hospital bed. Pt was alert, cooperative and pleasant. Pt kept good eye contact, spoke in a clear tone and at a slow pace. Pt moved in a normal manner when moving. Pt's thought process was coherent and relevant  although her comments focused on themes such as her thyroid disease, her father's death and her being alone and at time took on a flight of ideas feel. Pt's judgement was impaired.  There were many examples of delusional thinking but no indications of response to internal stimuli. Pt's mood was stated to be depressed and anxious and her blunted affect was congruent.  Pt was oriented x 4, to person, place, time and situation.   Diagnosis: Schizoaffective D/O by hx; GAD by hx  Past Medical History:  Past Medical History  Diagnosis Date  . Bipolar 1 disorder (HCC)   . Schizophrenia (HCC)   . Thyroid disease     History reviewed. No pertinent past surgical history.  Family History: No family history on file.  Social History:  reports that she has never smoked. She does not have any smokeless tobacco history on file. She reports that she does not drink alcohol. Her drug history is not on file.  Additional Social History:  Alcohol / Drug Use Prescriptions: see MAR History of alcohol / drug use?: Yes Longest period of sobriety (when/how long): unknown Substance #1 Name of Substance 1: Alcohol 1 - Age of First Use: as a child- Table wine at dinner with parents who are from [place] 1 - Amount (size/oz): "a glass or two" 1 - Frequency: 2-3 times per month 1 - Duration: ongoing 1 - Last Use / Amount: a few weeks ago  CIWA: CIWA-Ar BP: 116/61 mmHg Pulse Rate: 82 COWS:    PATIENT STRENGTHS: (choose at least two) Average or above average intelligence  Allergies: No Known Allergies  Home Medications:  (Not in a hospital admission)  OB/GYN Status:  No LMP recorded. Patient is not currently having periods (Reason: Other).  General Assessment Data Location of Assessment: [facility] ED TTS Assessment: In system Is this a Tele or Face-to-Face Assessment?: Tele Assessment Is this an Initial Assessment or a Re-assessment for this encounter?: Initial Assessment Marital status: Single Is patient  pregnant?: No Pregnancy Status: No Living Arrangements: Alone Can pt return to current living arrangement?: Yes Admission Status: Voluntary Is patient capable of signing voluntary admission?: Yes Referral Source: Self/Family/Friend Insurance type:  (Medicare)  Medical Screening Exam [facility] Walk-in ONLY) Medical Exam completed: Yes  Crisis Care Plan Living Arrangements: Alone Name of Psychiatrist:  [facility] Name of Therapist:  (none)  Education Status Is patient currently in school?: No Highest grade of school patient has completed:  (BA degree)  Risk to self with the past 6 months Suicidal Ideation: No (Pt denies) Has patient been a risk to self within the past 6 months prior to admission? : No Suicidal Intent: No Has patient had any suicidal intent within the past 6 months prior to admission? : No Is patient at risk for suicide?: No Suicidal Plan?: No (denies) Has patient had any suicidal plan within the past 6 months prior  to admission? : No What has been your use of drugs/alcohol within the last 12 months?:  (occasional use) Previous Attempts/Gestures: Yes How many times?:  (2) Other Self Harm Risks:  (denies) Triggers for Past Attempts: Unpredictable Intentional Self Injurious Behavior: None Recent stressful life event(s): Loss (Comment) (Father died last 22-Feb-2023; Pt sts she feels lost & alone) Persecutory voices/beliefs?: No Depression: Yes Depression Symptoms: Insomnia, Tearfulness, Isolating, Fatigue, Guilt, Loss of interest in usual pleasures, Feeling worthless/self pity Substance abuse history and/or treatment for substance abuse?: No Suicide prevention information given to non-admitted patients: Not applicable  Risk to Others within the past 6 months Homicidal Ideation: No (denies) Does patient have any lifetime risk of violence toward others beyond the six months prior to admission? : No (denies) Thoughts of Harm to Others: No (denies) Current  Homicidal Intent: No Current Homicidal Plan: No Access to Homicidal Means: No Identified Victim:  (none) History of harm to others?: No (denies) Assessment of Violence: None Noted Does patient have access to weapons?: No Criminal Charges Pending?: No (Denies any legal hx, past or present) Does patient have a court date: No Is patient on probation?: No  Psychosis Hallucinations: Auditory Delusions:  (Pt sts at times she can hear people praying like in church)  Mental Status Report Appearance/Hygiene: Disheveled Eye Contact: Fair Motor Activity: Freedom of movement Level of Consciousness: Quiet/awake Mood: Depressed Affect: Blunted Thought Processes: Tangential (focus stayed on father's death & her thyroid disease) Judgement: Impaired Orientation: Person, Place, Time Obsessive Compulsive Thoughts/Behaviors: None  Cognitive Functioning Concentration: Poor Memory: Recent Intact, Remote Intact IQ: Average Insight: Poor Impulse Control: Fair Appetite: Poor Weight Loss:  (pt could not quantify) Weight Gain:  (0) Sleep: Decreased (2-3 hours per night) Vegetative Symptoms: None  ADLScreening [facility] Assessment Services) Patient's cognitive ability adequate to safely complete daily activities?: Yes Patient able to express need for assistance with ADLs?: Yes Independently performs ADLs?: Yes (appropriate for developmental age)  Prior Inpatient Therapy Prior Inpatient Therapy: Yes Prior Therapy Dates:  (about 15 yrs ago per pt) Prior Therapy Facilty/Provider(s):  (15 years ago) Reason for Treatment:  (Suicide attempt)  Prior Outpatient Therapy Prior Outpatient Therapy: Yes Prior Therapy Dates:  (Sts went "a few times") Prior Therapy Facilty/Provider(s):  (Does not remember) Reason for Treatment:  (Anxiety, Depression) Does patient have an ACCT team?: No Does patient have Intensive In-House Services?  : No Does patient have [facility] services? : No Does patient have P4CC  services?: Unknown  ADL Screening (condition at time of admission) Patient's cognitive ability adequate to safely complete daily activities?: Yes Patient able to express need for assistance with ADLs?: Yes Independently performs ADLs?: Yes (appropriate for developmental age)       Abuse/Neglect Assessment (Assessment to be complete while patient is alone) Physical Abuse: Denies Verbal Abuse: Denies Sexual Abuse: Denies Exploitation of patient/patient's resources: Denies Self-Neglect: Denies     Merchant navy officer (For Healthcare) Does patient have an advance directive?: No Would patient like information on creating an advanced directive?: No - patient declined information    Additional Information 1:1 In Past 12 Months?: No CIRT Risk: No Elopement Risk: No Does patient have medical clearance?: Yes     Disposition:  Disposition Initial Assessment Completed for this Encounter: Yes Disposition of Patient: Other dispositions Other disposition(s):  (Pending review w [facility] Extender)  Per Donell Sievert, PA: Pt meets IP criteria. Recommend IP tx.  Per Sammuel Hines: No appropriate beds available at [facility] currently.  TTS to seek placement.  Spoke w Dr. Clarene Duke, EDP, at [facility]: Advised of recomemmendation. She agreed.   Beryle Flock, MS, CRC, [facility] Triage Specialist [facility] T 02/17/2016 5:24 AM

## 2016-02-18 DIAGNOSIS — F315 Bipolar disorder, current episode depressed, severe, with psychotic features: Secondary | ICD-10-CM

## 2016-02-18 MED ORDER — FLUOXETINE HCL 20 MG PO CAPS
20.0000 mg | ORAL_CAPSULE | Freq: Every day | ORAL | Status: DC
  Administered 2016-02-21: 20 mg via ORAL
  Filled 2016-02-18 (×5): qty 1

## 2016-02-18 MED ORDER — RISPERIDONE 1 MG PO TABS
1.0000 mg | ORAL_TABLET | Freq: Every day | ORAL | Status: DC
  Filled 2016-02-18: qty 1

## 2016-02-18 MED ORDER — TRAZODONE HCL 50 MG PO TABS
50.0000 mg | ORAL_TABLET | Freq: Every evening | ORAL | Status: DC | PRN
  Administered 2016-02-19 – 2016-02-21 (×3): 50 mg via ORAL
  Filled 2016-02-18 (×3): qty 1

## 2016-02-18 NOTE — [facility] Group Notes (Signed)
[facility] LCSW Group Therapy  02/18/2016  1:05 PM  Type of Therapy:  Group therapy  Participation Level:  Active  Participation Quality:  Attentive  Affect:  Flat  Cognitive:  Oriented  Insight:  Limited  Engagement in Therapy:  Limited  Modes of Intervention:  Discussion, Socialization  Summary of Progress/Problems:  Chaplain was here to lead a group on themes of hope and courage.  "Hope is something that keeps me going in a positive direction."  Rambling, tangential but able to track.  Related hope to faith-"I go to church to pray.  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  Daryel Geraldorth, Jessi Pitstick B 02/18/2016 1:15 PM

## 2016-02-18 NOTE — H&P (Signed)
Psychiatric Admission Assessment Adult  Patient Identification: Meghan Hensley MRN:  [id number] Date of Evaluation:  02/18/2016 Chief Complaint:  Schizoaffective disorder by history GAD by history Principal Diagnosis: Bipolar affective disorder, current episode depressed ([place]) Diagnosis:   Patient Active Problem List   Diagnosis Date Noted  . Bipolar affective disorder, current episode depressed ([place]) [F31.30] 02/17/2016    Priority: High  . Schizoaffective disorder, depressive type ([place]) [F25.1] 02/17/2016   History of Present Illness: Meghan Hensley, 45 y.o. single female who came initially to the [facility]  With altered mental status.  She admitted to [facility] with c/o of confusion.  She states that she was out in the heat looking for gas for her car.  She states that she was not clothed properly and she was sun burnt.  She does not have any visible sun burnt areas.  "Basically, I have health problems and mental illness to begin with.  Then I developed heat stroke.  The ambulance took me to hospital.  The heat got me confused.    She states that she also has history of hypothyroidism and Graves.  I'm on Synthroid.  Patient states that she is the only child and after her father dies, "it had a sociological effect on me."  She states she only has anxiety and depression.  She denies psychosis.  She denies mental health history or taking any psychotropic meds.    Per chart review, Pt sts she has not been taking her prescribed medications regularly. Pt sts that her PCP at [facility] is prescribing her psychiatric medications and she sts she is not seeing a therapist.  Pt sts her last psychiatric hospitalization was more than 15 years ago in [place]. 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 [place] she completed a BA degree. Pt sts she had one marriage which was annulled after a short period of time and has no children.   Associated  Signs/Symptoms: Depression Symptoms:  depressed mood, anxiety, (Hypo) Manic Symptoms:  Labiality of Mood, Anxiety Symptoms:  Excessive Worry, Psychotic Symptoms:  disorganized and preoccupied with thyroid condition PTSD Symptoms: NA Total Time spent with patient: 45 minutes  Past Psychiatric History: see HPI  Is the patient at risk to self? Yes.    Has the patient been a risk to self in the past 6 months? Yes.    Has the patient been a risk to self within the distant past? Yes.    Is the patient a risk to others? No.  Has the patient been a risk to others in the past 6 months? No.  Has the patient been a risk to others within the distant past? No.   Prior Inpatient Therapy:   Prior Outpatient Therapy:    Alcohol Screening: 1. How often do you have a drink containing alcohol?: 2 to 3 times a week 2. How many drinks containing alcohol do you have on a typical day when you are drinking?: 1 or 2 3. How often do you have six or more drinks on one occasion?: Never Preliminary Score: 0 9. Have you or someone else been injured as a result of your drinking?: No 10. Has a relative or friend or a doctor or another health worker been concerned about your drinking or suggested you cut down?: No Alcohol Use Disorder Identification Test Final Score (AUDIT): 3 Brief Intervention: AUDIT score less than 7 or less-screening does not suggest unhealthy drinking-brief intervention not indicated Substance Abuse  History in the last 12 months:  No. Consequences of Substance Abuse: NA Previous Psychotropic Medications: Yes  Psychological Evaluations: No  Past Medical History:  Past Medical History  Diagnosis Date  . Bipolar 1 disorder ([place])   . Schizophrenia ([place])   . Thyroid disease    History reviewed. No pertinent past surgical history. Family History: History reviewed. No pertinent family history. Family Psychiatric  History: see HPI Tobacco Screening: '@FLOW'$ [phone])::1)@ Social History:   History  Alcohol Use No     History  Drug Use Not on file    Additional Social History:  Allergies:  No Known Allergies Lab Results:  Results for orders placed or performed during the hospital encounter of 02/16/16 (from the past 48 hour(s))  CBC     Status: Abnormal   Collection Time: 02/17/16 12:26 AM  Result Value Ref Range   WBC 11.3 (H) 4.0 - 10.5 K/uL   RBC 3.71 (L) 3.87 - 5.11 MIL/uL   Hemoglobin 10.7 (L) 12.0 - 15.0 g/dL   HCT 33.0 (L) 36.0 - 46.0 %   MCV 88.9 78.0 - 100.0 fL   MCH 28.8 26.0 - 34.0 pg   MCHC 32.4 30.0 - 36.0 g/dL   RDW 15.2 11.5 - 15.5 %   Platelets 280 150 - 400 K/uL  Comprehensive metabolic panel     Status: Abnormal   Collection Time: 02/17/16 12:26 AM  Result Value Ref Range   Sodium 138 135 - 145 mmol/L   Potassium 3.5 3.5 - 5.1 mmol/L   Chloride 103 101 - 111 mmol/L   CO2 27 22 - 32 mmol/L   Glucose, Bld 93 65 - 99 mg/dL   BUN 12 6 - 20 mg/dL   Creatinine, Ser 1.19 (H) 0.44 - 1.00 mg/dL   Calcium 9.1 8.9 - 10.3 mg/dL   Total Protein 7.8 6.5 - 8.1 g/dL   Albumin 3.8 3.5 - 5.0 g/dL   AST 29 15 - 41 U/L   ALT 34 14 - 54 U/L   Alkaline Phosphatase 80 38 - 126 U/L   Total Bilirubin 0.9 0.3 - 1.2 mg/dL   GFR calc non Af Amer 55 (L) >60 mL/min   GFR calc Af Amer >60 >60 mL/min    Comment: (NOTE) The eGFR has been calculated using the CKD EPI equation. This calculation has not been validated in all clinical situations. eGFR's persistently <60 mL/min signify possible Chronic Kidney Disease.    Anion gap 8 5 - 15  Ethanol     Status: None   Collection Time: 02/17/16 12:26 AM  Result Value Ref Range   Alcohol, Ethyl (B) <5 <5 mg/dL    Comment:        LOWEST DETECTABLE LIMIT FOR SERUM ALCOHOL IS 5 mg/dL FOR MEDICAL PURPOSES ONLY   TSH     Status: Abnormal   Collection Time: 02/17/16 12:26 AM  Result Value Ref Range   TSH >90.000 (H) 0.350 - 4.500 uIU/mL  T4, free     Status: Abnormal   Collection Time: 02/17/16 12:26 AM  Result  Value Ref Range   Free T4 0.43 (L) 0.61 - 1.12 ng/dL    Comment: (NOTE) Biotin ingestion may interfere with free T4 tests. If the results are inconsistent with the TSH level, previous test results, or the clinical presentation, then consider biotin interference. If needed, order repeat testing after stopping biotin. Performed at [facility]     Blood Alcohol level:  Lab Results  Component Value Date  ETH <5 02/17/2016   ETH <5 09/21/2015    Metabolic Disorder Labs:  No results found for: HGBA1C, MPG No results found for: PROLACTIN No results found for: CHOL, TRIG, HDL, CHOLHDL, VLDL, LDLCALC  Current Medications: Current Facility-Administered Medications  Medication Dose Route Frequency Provider Last Rate Last Dose  . acetaminophen (TYLENOL) tablet 650 mg  650 mg Oral Q6H PRN Charm Rings, NP      . alum & mag hydroxide-simeth (MAALOX/MYLANTA) 200-200-20 MG/5ML suspension 30 mL  30 mL Oral Q4H PRN Charm Rings, NP      . FLUoxetine (PROZAC) capsule 10 mg  10 mg Oral Daily Charm Rings, NP   10 mg at 02/18/16 0856  . gabapentin (NEURONTIN) capsule 200 mg  200 mg Oral BID Charm Rings, NP   200 mg at 02/18/16 0856  . hydrOXYzine (ATARAX/VISTARIL) tablet 25 mg  25 mg Oral Q6H PRN Charm Rings, NP   25 mg at 02/17/16 2359  . levothyroxine (SYNTHROID, LEVOTHROID) tablet 200 mcg  200 mcg Oral QAC breakfast Charm Rings, NP   200 mcg at 02/18/16 5672  . magnesium hydroxide (MILK OF MAGNESIA) suspension 30 mL  30 mL Oral Daily PRN Charm Rings, NP      . traZODone (DESYREL) tablet 100 mg  100 mg Oral QHS PRN Charm Rings, NP   100 mg at 02/17/16 2202   PTA Medications: Prescriptions prior to admission  Medication Sig Dispense Refill Last Dose  . ibuprofen (ADVIL,MOTRIN) 200 MG tablet Take 600-800 mg by mouth every 6 (six) hours as needed for headache or moderate pain.   Past Month at Unknown time  . levothyroxine (SYNTHROID, LEVOTHROID) 200 MCG tablet Take 200  mcg by mouth daily.  11 Past Week at Unknown time    Musculoskeletal: Strength & Muscle Tone: within normal limits Gait & Station: normal Patient leans: N/A  Psychiatric Specialty Exam: Physical Exam  Nursing note and vitals reviewed. Psychiatric: Her mood appears anxious. Her affect is not inappropriate. Her speech is not slurred. She is not agitated, not aggressive, not hyperactive and not combative. Thought content is not paranoid. Cognition and memory are not impaired. She exhibits a depressed mood. She expresses no homicidal and no suicidal ideation. She is communicative. She exhibits normal recent memory and normal remote memory.    Review of Systems  Constitutional: Negative.   HENT: Negative.   Eyes: Negative.   Respiratory: Negative.   Gastrointestinal: Negative.   Genitourinary: Negative.   Musculoskeletal: Negative.   Skin: Negative.   Neurological: Negative.   Endo/Heme/Allergies: Negative.   Psychiatric/Behavioral: Positive for depression. Negative for suicidal ideas and hallucinations. The patient is nervous/anxious.     Blood pressure 103/67, pulse 98, temperature 97.9 F (36.6 C), temperature source Oral, resp. rate 16, height 5' 10.5" (1.791 m), weight 144.244 kg (318 lb).Body mass index is 44.97 kg/(m^2).  General Appearance: Disheveled  Eye Contact:  Fair  Speech:  Clear and Coherent  Volume:  Normal  Mood:  Euthymic  Affect:  Full Range  Thought Process:  Disorganized  Orientation:  Other:  disorganized, oriented to time and place  Thought Content:  Rumination  Suicidal Thoughts:  No  Homicidal Thoughts:  No  Memory:  Immediate;   Poor Recent;   Poor Remote;   Poor  Judgement:  Fair  Insight:  Lacking  Psychomotor Activity:  Normal  Concentration:  Concentration: Fair and Attention Span: Fair  Recall:  [employer] of  Knowledge:  Fair  Language:  Good  Akathisia:  Negative  Handed:  Right  AIMS (if indicated):     Assets:  Communication Skills   ADL's:  Intact  Cognition:  WNL  Sleep:  Number of Hours: 6   Treatment Plan Summary: Admit for crisis management and mood stabilization. Medication management to re-stabilize current mood symptoms Group counseling sessions for coping skills Medical consults as needed Review and reinstate any pertinent home medications for other health problems   Observation Level/Precautions:  15 minute checks  Laboratory:  per ED  Psychotherapy:  group  Medications:  As per medlist  Consultations:  As needed  Discharge Concerns:  safety  Estimated LOS:  2-7 days  Other:     I certify that inpatient services furnished can reasonably be expected to improve the patient's condition.    [facility], NP [facility] [place] 7/21/20174:06 PM Patient discussed with NP and seen by me Agree with   NP note and assessment  45 year old divorced female, no children, lives alone,  currently unemployed, on disability . No SO at this time. Denies legal issues .  Patient is a fair historian , states that she  was brought in to hospital by EMS, states " I was having a heat stroke", and states " I had run out of gas , so I was walking to get gas for my car, and it was very hot outside". States " the heat and my thyroid disease made it so that I did not feel as confident about myself, I got confused, and I felt I would not be able to recover, so I called EMS ".  States that her car has been running out of gas often  and so has had to walk a lot , states " having to run for gas made me feel hopeless, like no one was going to help me , and it made me depressed ". Reports  symptoms of depression, she states " I have been blue since my dad died last year  ", and reports a sense of sadness, depression, and decreased energy,  decreased appetite ( has lost 20 + lbs over recent weeks ), decreased sleep.  Denies any suicidal ideations . Denies hallucinations. No clear delusions expressed- did make a statement that her mother asked her to  move in with her but she opted not to because " there seems to be cancer in the air in that house", ( explains statement as referring to her concern that her father was diagnosed with three different types of cancer while living there ).  Patient presents somatically focused, reporting recent nausea, with poor appetite which she thinks may be related to hypothyroidism as well .    Patient reports a history of Schizoaffective Disorder, and has had several psychiatric admissions . States she had one suicide attempt by overdosing about 20 years ago. Denies panic attacks, denies agoraphobia . Denies history of violence .   Denies any drug or alcohol abuse   Medical History is remarkable for Hypothyroidism . NKDA.  Does not smoke . Prior to admission medication- Synthroid 200 micrograms per day- states she had not been taking for about a week prior to admission  TSH is very elevated .  Family History- father passed away from lung cancer last year, mother alive, states she is distant from mother . An aunt has history of depression .Denies suicides in family.  Dx- Schizoaffective Disorder,  Depressed     Plan- inpatient  admission , continue Synthroid for hypothyroidism , takes 200 micrograms per day . Continue Prozac - considering increasing dose to 20 mgrs QDAY, patient not interested in antipsychotic medication at this time- will D/C low dose Risperidone

## 2016-02-18 NOTE — Progress Notes (Signed)
Pt was in bed asleep at the beginning of the shift.  Staff woke her when it was time for evening group.  Pt seemed a little disoriented and asked, "so you want me to go to this group?"  Pt was encouraged to attend as many groups throughout the day as part of her treatment plan.  Pt denies SI/HI/AVH.  She is polite and cooperative with staff.  Writer has noted that pt's lower extremities are swollen and there is a reddened area on her L lower leg around an old scrape wound that she says is from a razor.  Pt denies any pain, tingling, or tightness, although her lower legs look red and "tight".  Pt was encouraged to keep her legs elevated as much as possible and the NP will be informed in the AM.  Pt makes her needs known to staff.  Support and encouragement offered.  Discharge plans are in process.  Safety maintained with q15 minute checks.

## 2016-02-18 NOTE — Progress Notes (Signed)
D: Patient denies SI/HI and A/V hallucinations; patient reports sleep is poor; reports appetite is fair ; reports energy level is high ; reports ability to concentrate is poor; rates depression as 3/10; rates hopelessness 0/10; rates anxiety as 2/10;   A: Monitored q 15 minutes; patient encouraged to attend groups; patient educated about medications; patient given medications per physician orders; patient encouraged to express feelings and/or concerns  R: Patient is minimal; patient needs simple directions; patient has been in the bed most of the day except for meals; patient need redirection; patient is walks up and down the hallway; patient can be childlike regarding asking questions and receiving directions; patient's interaction with staff and peers is minimal; patient was able to set goal to talk with staff 1:1 when having feelings of SI; patient is taking medications as prescribed and tolerating medications

## 2016-02-18 NOTE — BHH Suicide Risk Assessment (Addendum)
[facility] Admission Suicide Risk Assessment   Nursing information obtained from:  Patient Demographic factors:  Divorced or widowed, Caucasian, Living alone, Unemployed Current Mental Status:  See below  Loss Factors:  Loss of significant relationship Historical Factors:  History of Schizoaffective Disorder  Risk Reduction Factors: resilience, motivated to improved , get treatment at this time. Total Time spent with patient: 45 minutes Principal Problem: Schizoaffective Disorder - depressed  Diagnosis:   Patient Active Problem List   Diagnosis Date Noted  . Schizoaffective disorder, depressive type ([facility]) [F25.1] 02/17/2016  . Bipolar affective disorder, current episode depressed ([facility]) [F31.30] 02/17/2016     Continued Clinical Symptoms:  Alcohol Use Disorder Identification Test Final Score (AUDIT): 3 The "Alcohol Use Disorders Identification Test", Guidelines for Use in Primary Care, Second Edition.  World Science writer [facility]). Score between 0-7:  no or low risk or alcohol related problems. Score between 8-15:  moderate risk of alcohol related problems. Score between 16-19:  high risk of alcohol related problems. Score 20 or above:  warrants further diagnostic evaluation for alcohol dependence and treatment.   CLINICAL FACTORS:  45 year old divorced female, no children, lives alone,  currently unemployed, on disability . No SO at this time. Denies legal issues .  Patient is a fair historian , states that she  was brought in to hospital by EMS, states " I was having a heat stroke", and states " I had run out of gas , so I was walking to get gas for my car, and it was very hot outside". States " the heat and my thyroid disease made it so that I did not feel as confident about myself, I got confused, and I felt I would not be able to recover, so I called EMS ".  States that her car has been running out of gas often  and so has had to walk a lot , states " having to run for gas made me feel  hopeless, like no one was going to help me , and it made me depressed ". Reports  symptoms of depression, she states " I have been blue since my dad died last year  ", and reports a sense of sadness, depression, and decreased energy,  decreased appetite ( has lost 20 + lbs over recent weeks ), decreased sleep.  Denies any suicidal ideations . Denies hallucinations. No clear delusions expressed- did make a statement that her mother asked her to move in with her but she opted not to because " there seems to be cancer in the air in that house", ( explains statement as referring to her concern that her father was diagnosed with three different types of cancer while living there ).  Patient presents somatically focused, reporting recent nausea, with poor appetite which she thinks may be related to hypothyroidism as well .    Patient reports a history of Schizoaffective Disorder, and has had several psychiatric admissions . States she had one suicide attempt by overdosing about 20 years ago. Denies panic attacks, denies agoraphobia . Denies history of violence .   Denies any drug or alcohol abuse   Medical History is remarkable for Hypothyroidism . NKDA.  Does not smoke . Prior to admission medication- Synthroid 200 micrograms per day- states she had not been taking for about a week prior to admission  TSH is very elevated .  Family History- father passed away from lung cancer last year, mother alive, states she is distant from mother . An  aunt has history of depression .Denies suicides in family.  Dx- Schizoaffective Disorder,  Depressed     Plan- inpatient admission , continue Synthroid for hypothyroidism , takes 200 micrograms per day . Continue Prozac - considering increasing dose to 20 mgrs QDAY, patient not interested in antipsychotic medication at this time- will D/C low dose Risperidone     Musculoskeletal: Strength & Muscle Tone: within normal limits Gait & Station: normal Patient leans:  N/A  Psychiatric Specialty Exam: Physical Exam  ROS no headache, no chest pain, no shortness of breath, denies nausea, denies vomiting, no diarrhea .  Blood pressure 103/67, pulse 98, temperature 97.9 F (36.6 C), temperature source Oral, resp. rate 16, height 5' 10.5" (1.791 m), weight 318 lb (144.244 kg).Body mass index is 44.97 kg/(m^2).  General Appearance: Fairly Groomed  Eye Contact:  Good  Speech:  Normal Rate  Volume:  normal   Mood:  Depressed   Affect:  mildly anxious  Thought Process:  Linear  Orientation:  Other:  fully alert and attentive  Thought Content:  denies hallucinations, no delusions expressed at this time, does not appear internally preoccupied   Suicidal Thoughts:  No at this time denies suicidal ideations, denies any self injurious ideations, and she contracts for safety   Homicidal Thoughts:  No  Memory:  recent and remote grossly intact   Judgement:  Fair  Insight:  Fair  Psychomotor Activity:  Normal  Concentration:  Concentration: Good and Attention Span: Good  Recall:  Good  Fund of Knowledge:  Good  Language:  Good  Akathisia:  No  Handed:  Right  AIMS (if indicated):     Assets:  Desire for Improvement Social Support  ADL's: fair   Cognition:  WNL  Sleep:  Number of Hours: 6      COGNITIVE FEATURES THAT CONTRIBUTE TO RISK:  Closed-mindedness and Loss of executive function    SUICIDE RISK:   Moderate:  Frequent suicidal ideation with limited intensity, and duration, some specificity in terms of plans, no associated intent, good self-control, limited dysphoria/symptomatology, some risk factors present, and identifiable protective factors, including available and accessible social support.  PLAN OF CARE: Patient will be admitted to inpatient psychiatric unit for stabilization and safety. Will provide and encourage milieu participation. Provide medication management and maked adjustments as needed.  Will follow daily.    I certify that  inpatient services furnished can reasonably be expected to improve the patient's condition.   Nehemiah MassedOBOS, FERNANDO, MD 02/18/2016, 3:46 PM

## 2016-02-18 NOTE — Tx Team (Signed)
Interdisciplinary Treatment Plan Update (Adult)  Date:  02/18/2016   Time Reviewed:  8:42 AM   Progress in Treatment: Attending groups: Yes. Participating in groups:  Yes. Taking medication as prescribed:  Yes. Tolerating medication:  Yes. Family/Significant other contact made:  No Patient understands diagnosis:  Yes  As evidenced by seeking help with "I need someone to help me with things like cooking." Discussing patient identified problems/goals with staff:  Yes, see initial care plan. Medical problems stabilized or resolved:  Yes. Denies suicidal/homicidal ideation: Yes. Issues/concerns per patient self-inventory:  No. Other:  New problem(s) identified:  Discharge Plan or Barriers: see below  Reason for Continuation of Hospitalization: Depression Medication stabilization  Comments: Meghan Hensley is an 45 y.o. single female who came to the [facility] voluntarily via EMS tonight with an altered mental status. Pt's unclear thinking, depression symptoms combined with her delusional explanations seem symptomatic of schizoaffective disorder which she has been previously diagnosed. Pt sts she has been "feeling down" ever since her father death last year for a terminal illness. Pt sts she is not taking care of herself very well and does not have the support she needs. 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  Prozac, Neurontin trial  Estimated length of stay:  4-5 days  New goal(s):  Review of initial/current patient goals per problem list:   Review of initial/current patient goals per problem list:  1. Goal(s): Patient will participate in aftercare plan   Met: No   Target date: 3-5 days post admission date   As evidenced by: Patient will participate within aftercare plan AEB aftercare provider and housing plan  at discharge being identified. 02/18/16:  "I need someone to help me every day.  I don't have anyone right now."   2. Goal (s): Patient will exhibit decreased depressive symptoms and suicidal ideations.   Met: No   Target date: 3-5 days post admission date   As evidenced by: Patient will utilize self rating of depression at 3 or below and demonstrate decreased signs of depression or be deemed stable for discharge by MD. 02/18/16:  Rates depression a 10 today; denies SI          Attendees: Patient:  02/18/2016 8:42 AM   Family:   02/18/2016 8:42 AM   Physician:  Hampton Abbot, MD 02/18/2016 8:42 AM   Nursing:   Marilynne Halsted, RN 02/18/2016 8:42 AM   CSW:    Roque Lias, LCSW   02/18/2016 8:42 AM   Other:  02/18/2016 8:42 AM   Other:   02/18/2016 8:42 AM   Other:  Lars Pinks, Nurse CM 02/18/2016 8:42 AM   Other:   02/18/2016 8:42 AM   Other:  Norberto Sorenson, [place]  02/18/2016 8:42 AM   Other:  02/18/2016 8:42 AM   Other:  02/18/2016 8:42 AM   Other:  02/18/2016 8:42 AM   Other:  02/18/2016 8:42 AM   Other:  02/18/2016 8:42 AM   Other:   02/18/2016 8:42 AM    Scribe for Treatment Team:   Trish Mage, 02/18/2016 8:42 AM

## 2016-02-18 NOTE — Progress Notes (Signed)
Adult Psychoeducational Group Note  Date:  02/18/2016 Time:  8:55 PM  Group Topic/Focus:  Wrap-Up Group:   The focus of this group is to help patients review their daily goal of treatment and discuss progress on daily workbooks.  Participation Level:  Active  Participation Quality:  Appropriate  Affect:  Appropriate  Cognitive:  Appropriate  Insight: Appropriate  Engagement in Group:  Engaged  Modes of Intervention:  Discussion  Additional Comments: The patient expressed that she attended groups.The patient also her day was rough.  Octavio Mannshigpen, Karyna Bessler Lee 02/18/2016, 8:55 PM

## 2016-02-18 NOTE — Progress Notes (Signed)
Pt is new to the unit this afternoon.  She was in the bed asleep at the beginning of the shift, but [place].  Pt has been pleasant and cooperative with staff.  She seems a little slow with her responses when engaged in conversation, but otherwise has been appropriate.  Pt denies SI/HI/AVH with Clinical research associatewriter.  She seems to be frustrated with the direction her life has followed and has unresolved grief concerning the death of her father.  She is not sure where her mother is, as they did not "get along".  She has no brothers or sisters.  Pt was encouraged to make her needs known to staff.  Writer reviewed the meds that are available to the pt tonight, and pt voiced understanding.  Support and encouragement offered.  Discharge plans are in process.  Safety maintained with q15 minute checks.

## 2016-02-19 DIAGNOSIS — F314 Bipolar disorder, current episode depressed, severe, without psychotic features: Secondary | ICD-10-CM

## 2016-02-19 MED ORDER — LOPERAMIDE HCL 2 MG PO CAPS
2.0000 mg | ORAL_CAPSULE | ORAL | Status: AC | PRN
  Administered 2016-02-19: 4 mg via ORAL
  Administered 2016-02-19: 2 mg via ORAL
  Administered 2016-02-20 (×2): 4 mg via ORAL
  Administered 2016-02-21: 2 mg via ORAL
  Filled 2016-02-19 (×2): qty 1
  Filled 2016-02-19 (×3): qty 2

## 2016-02-19 MED ORDER — ONDANSETRON 4 MG PO TBDP
4.0000 mg | ORAL_TABLET | Freq: Four times a day (QID) | ORAL | Status: AC | PRN
  Administered 2016-02-19 – 2016-02-20 (×4): 4 mg via ORAL
  Filled 2016-02-19 (×4): qty 1

## 2016-02-19 NOTE — [facility] Group Notes (Signed)
[facility] Group Notes:  (Clinical Social Work)  02/19/2016  11:15-12:00PM  Summary of Progress/Problems:   Today's process group involved patients discussing their feelings related to being hospitalized, as well as how they can use their feelings to create an aftercare plan that incorporates some of the hospital's positives for improved mental and physical health going forward.  Particularly emphasized by many participants was the need for a routine and the wisdom of staying away from drugs and alcohol. The patient expressed her primary feeling about being hospitalized is sad, because she should be doing other things.  On the other hand, she is happy for the treatment.  She talked at length about getting heatstroke, remained focused on being out in the heat, not taking care of herself.  She was disorganized and tangential, had a very difficult time focusing, repeatedly asked "what is the question?" throughout group.  Type of Therapy:  Group Therapy - Process  Participation Level:  Active  Participation Quality:  Attentive  Affect:  Flat  Cognitive:  Disorganized  Insight:  Limited  Engagement in Therapy:  Improving  Modes of Intervention:  Exploration, Discussion  Ambrose Mantle, LCSW 02/19/2016, 12:58 PM

## 2016-02-19 NOTE — Progress Notes (Signed)
D.  Pt pleasant on approach but forwards little.  Pt states she has had a "sour stomach" today and requests Ginger Ale.  Pt was positive for evening wrap up group with appropriate participation.  Pt sat up in dayroom but with little interaction with peers.  Pt denies SI/HI/hallucinations at this time.  A.  Support and encouragement offered, medication given as ordered. R. Pt remains safe on the unit, will continue to monitor.

## 2016-02-19 NOTE — Progress Notes (Signed)
Adult Psychoeducational Group Note  Date:  02/19/2016 Time:  8:58 PM  Group Topic/Focus:  Wrap-Up Group:   The focus of this group is to help patients review their daily goal of treatment and discuss progress on daily workbooks.  Participation Level:  Active  Participation Quality:  Appropriate  Affect:  Appropriate  Cognitive:  Appropriate  Insight: Appropriate  Engagement in Group:  Engaged  Modes of Intervention:  Discussion  Additional Comments:  The patient expressed that she rates today a 5 because she was sick.The patient also said that she atttended group about hope.  Octavio Manns 02/19/2016, 8:58 PM

## 2016-02-19 NOTE — Progress Notes (Signed)
Pt came to the NS c/o nausea/vomiting.  She was given ginger ale and crackers.  Pt returned to her room.  She vomited again after returning to her room.  Writer took her a cloth to put to her forehead.  No meds given at that time.  At this time, pt is in bed asleep.  No distress observed.  Pt remains safe with q15 minute checks.

## 2016-02-19 NOTE — Progress Notes (Signed)
Patient ID: Meghan Hensley, female   DOB: 04/02/1971, 45 y.o.   MRN: [id number]    D: Pt has been very flat and depressed on the unit today. Pt was in the bed most of the day reporting that she did not feel well. Pt reported that she felt sick and that she had been throwing up and having loose stools. Pt advised to leave emesis and stool so that this writer could assess, patient has not advised of any more emesis or loose stools. Pt reported that her depression was a 0, her hopelessness was a 2, and her anxiety was a 2. Pt reported that her goal for today was to stabilize vomit and diarrhea. Pt reported being negative SI/HI, no AH/VH noted. A: 15 min checks continued for patient safety. R: Pt safety maintained.

## 2016-02-19 NOTE — Progress Notes (Signed)
[facility] - Closed MD Progress Note  02/19/2016 4:05 PM Meghan Hensley  MRN:  [id number] Subjective:  "I had diarrhea twice.  I did have some nausea but that went away." Objective:   Meghan Hensley, 45 yo came in with altered mental status.  She was preoccupiedwith her medical diagnosis of hypothyroidism.  Patient's father passed away a couple of years ago and this has had a profound effect on her.  Her mental health declined as a result.  She does seem alert and oriented but mostly appears with a dazed look.  However, once she talks, she is alert  Principal Problem: Bipolar affective disorder, current episode depressed (HCC) Diagnosis:   Patient Active Problem List   Diagnosis Date Noted  . Bipolar affective disorder, current episode depressed (HCC) [F31.30] 02/17/2016    Priority: High  . Schizoaffective disorder, depressive type (HCC) [F25.1] 02/17/2016   Total Time spent with patient: 45 minutes  Past Psychiatric History: see HPI  Past Medical History:  Past Medical History  Diagnosis Date  . Bipolar 1 disorder (HCC)   . Schizophrenia (HCC)   . Thyroid disease    History reviewed. No pertinent past surgical history. Family History: History reviewed. No pertinent family history. Family Psychiatric  History: see HPI Social History:  History  Alcohol Use No     History  Drug Use Not on file    Social History   Social History  . Marital Status: Divorced    Spouse Name: N/A  . Number of Children: N/A  . Years of Education: N/A   Social History Main Topics  . Smoking status: Never Smoker   . Smokeless tobacco: None  . Alcohol Use: No  . Drug Use: None  . Sexual Activity: Not Asked   Other Topics Concern  . None   Social History Narrative   Additional Social History:    Sleep: Good  Appetite:  Good  Current Medications: Current Facility-Administered Medications  Medication Dose Route Frequency Provider Last Rate Last Dose  . acetaminophen (TYLENOL) tablet 650 mg  650  mg Oral Q6H PRN Charm Rings, NP      . alum & mag hydroxide-simeth (MAALOX/MYLANTA) 200-200-20 MG/5ML suspension 30 mL  30 mL Oral Q4H PRN Charm Rings, NP      . FLUoxetine (PROZAC) capsule 20 mg  20 mg Oral Daily Craige Cotta, MD   20 mg at 02/19/16 0955  . gabapentin (NEURONTIN) capsule 200 mg  200 mg Oral BID Charm Rings, NP   200 mg at 02/19/16 1558  . hydrOXYzine (ATARAX/VISTARIL) tablet 25 mg  25 mg Oral Q6H PRN Charm Rings, NP   25 mg at 02/17/16 2359  . levothyroxine (SYNTHROID, LEVOTHROID) tablet 200 mcg  200 mcg Oral QAC breakfast Charm Rings, NP   200 mcg at 02/19/16 4098  . magnesium hydroxide (MILK OF MAGNESIA) suspension 30 mL  30 mL Oral Daily PRN Charm Rings, NP      . traZODone (DESYREL) tablet 50 mg  50 mg Oral QHS PRN Craige Cotta, MD        Lab Results: No results found for this or any previous visit (from the past 48 hour(s)).  Blood Alcohol level:  Lab Results  Component Value Date   ETH <5 02/17/2016   ETH <5 09/21/2015    Metabolic Disorder Labs: No results found for: HGBA1C, MPG No results found for: PROLACTIN No results found for: CHOL, TRIG, HDL, CHOLHDL, VLDL, LDLCALC  Physical Findings:  AIMS: Facial and Oral Movements Muscles of Facial Expression: None, normal Lips and Perioral Area: None, normal Jaw: None, normal Tongue: None, normal,Extremity Movements Upper (arms, wrists, hands, fingers): None, normal Lower (legs, knees, ankles, toes): None, normal, Trunk Movements Neck, shoulders, hips: None, normal, Overall Severity Severity of abnormal movements (highest score from questions above): None, normal Incapacitation due to abnormal movements: None, normal Patient's awareness of abnormal movements (rate only patient's report): No Awareness, Dental Status Current problems with teeth and/or dentures?: No Does patient usually wear dentures?: No  CIWA:    COWS:     Musculoskeletal: Strength & Muscle Tone: within normal  limits Gait & Station: normal Patient leans: N/A  Psychiatric Specialty Exam: Physical Exam  Vitals reviewed. Psychiatric: Her speech is normal. Judgment and thought content normal. Her mood appears anxious. She is slowed and withdrawn. Cognition and memory are normal. She exhibits a depressed mood.    Review of Systems  Constitutional: Negative.   HENT: Negative.   Eyes: Negative.   Respiratory: Negative.   Cardiovascular: Negative.   Gastrointestinal: Positive for nausea and diarrhea.  Genitourinary: Negative.   Musculoskeletal: Negative.   Skin: Negative.   Neurological: Negative.   Endo/Heme/Allergies: Negative.   Psychiatric/Behavioral: Positive for depression. The patient is nervous/anxious.   All other systems reviewed and are negative.   Blood pressure 113/81, pulse 98, temperature 97.3 F (36.3 C), temperature source Oral, resp. rate 20, height 5' 10.5" (1.791 m), weight 144.244 kg (318 lb).Body mass index is 44.97 kg/(m^2).  General Appearance: Disheveled  Eye Contact:  Good  Speech:  Clear and Coherent  Volume:  Normal  Mood:  Anxious and Euphoric  Affect:  Congruent  Thought Process:  Coherent  Orientation:  Full (Time, Place, and Person)  Thought Content:  Logical  Suicidal Thoughts:  No  Homicidal Thoughts:  No  Memory:  Immediate;   Good Recent;   Good Remote;   Good  Judgement:  Impaired  Insight:  Lacking and Shallow  Psychomotor Activity:  Increased  Concentration:  Concentration: Good and Attention Span: Good  Recall:  Good  Fund of Knowledge:  Good  Language:  Good  Akathisia:  Negative  Handed:  Right  AIMS (if indicated):     Assets:  Desire for Improvement Financial Resources/Insurance Physical Health Resilience  ADL's:  Intact  Cognition:  WNL  Sleep:  Number of Hours: 6.5   Treatment Plan Summary: Review of chart, vital signs, medications, and notes.  1-Individual and group therapy  2-Medication management for depression and  anxiety: Medications reviewed with the patient and she stated no untoward effects, unchanged.  Prozac 20 mg depression.  Gabapentin 200 mg TID anxiety.  Trazodone 50 mg insomnia 3-Coping skills for depression, anxiety  4-Continue crisis stabilization and management  5-Address health issues--monitoring vital signs, stable  6-Treatment plan in progress to prevent relapse of depression and anxiety  Lindwood Qua, NP [facility] 02/19/2016, 4:05 PM  Reviewed the information documented and agree with the treatment plan.  Cristianna Cyr 02/20/2016 11:50 AM

## 2016-02-20 NOTE — BHH Group Notes (Signed)
[facility] Group Notes: (Clinical Social Work)   02/20/2016      Type of Therapy:  Group Therapy   Participation Level:  Did Not Attend despite MHT prompting   Ambrose Mantle, LCSW 02/20/2016, 12:17 PM

## 2016-02-20 NOTE — Progress Notes (Signed)
D.  Pt pleasant on approach, flat affect.  Pt denies complaints other than some continued "sour stomach" that she attributes to past "heat stroke".  Pt slept through evening wrap up group, as she had been sick for the past couple of days with very poor sleep.  Pt states this is getting some better but is still there.  Pt denies SI/HI/hallucinations but was observed alone in dayroom talking quietly by MHT.  Minimal interaction on unit.  A.  Support and encouragement offered, medication given as ordered.  R.  Pt remains safe on the unit, will continue to monitor.

## 2016-02-20 NOTE — BHH Counselor (Signed)
Adult Comprehensive Assessment  Patient ID: Meghan Hensley, female   DOB: [birth year], 45 y.o.   MRN: [id number]  Information Source: Information source: Patient  Current Stressors:  Employment / Job issues: unemployed, on disability Family Relationships: strained relationship with mother Surveyor, quantity / Lack of resources (include bankruptcy): fixed income Physical health (include injuries & life threatening diseases): reports multiple medical issues Social relationships: poor support system Bereavement / Loss: father passed away last year  Living/Environment/Situation:  Living Arrangements: Alone Living conditions (as described by patient or guardian): Pt lives in [place] alone.  Pt reports this is a good environment but lacks support. How long has patient lived in current situation?: 3 years What is atmosphere in current home: Comfortable  Family History:  Marital status: Single Does patient have children?: No  Childhood History:  By whom was/is the patient raised?: Both parents Additional childhood history information: Pt reports being raised by both parents.  Pt states her childhood was pretty good but parents were [place] and she had to translate and grow up fast to help them.   Description of patient's relationship with caregiver when they were a child: Pt reports getting along well with parents growing up.   Patient's description of current relationship with people who raised him/her: Father passed away last year, strained relationship with mother now How were you disciplined when you got in trouble as a child/adolescent?: scoulded and talked to, things taken away Does patient have siblings?: No Did patient suffer any verbal/emotional/physical/sexual abuse as a child?: No Did patient suffer from severe childhood neglect?: No Has patient ever been sexually abused/assaulted/raped as an adolescent or adult?: No Was the patient ever a victim of a crime or a disaster?: No Witnessed  domestic violence?: No Has patient been effected by domestic violence as an adult?: No  Education:  Highest grade of school patient has completed: BA degree Currently a student?: No Learning disability?: No  Employment/Work Situation:   Employment situation: On disability Why is patient on disability: mental health How long has patient been on disability: 4 years Patient's job has been impacted by current illness: No What is the longest time patient has a held a job?: 5 years Where was the patient employed at that time?: [employer] Has patient ever been in the [employer]?: No Has patient ever served in combat?: No Did You Receive Any Psychiatric Treatment/Services While in Equities trader?: No Are There Guns or Other Weapons in Your Home?: No  Financial Resources:   Surveyor, quantity resources: Insurance claims handler, Medicare Does patient have a Lawyer or guardian?: No  Alcohol/Substance Abuse:   What has been your use of drugs/alcohol within the last 12 months?: "not that I'm aware of"  If attempted suicide, did drugs/alcohol play a role in this?: No Alcohol/Substance Abuse Treatment Hx: Denies past history Has alcohol/substance abuse ever caused legal problems?: No  Social Support System:   Forensic psychologist System: None Describe Community Support System: pt denies having a support system Type of faith/religion: Catholic How does patient's faith help to cope with current illness?: prayer  Leisure/Recreation:   Leisure and Hobbies: Theatre stage manager, Retail buyer, Financial risk analyst, fashion, music, people  Strengths/Needs:   What things does the patient do well?: "a lot of different things", art, music, people In what areas does patient struggle / problems for patient: reports medical issues caused change in mental state  Discharge Plan:   Does patient have access to transportation?: Yes Will patient be returning to same living situation after discharge?: Yes Currently receiving  community mental health services: No If no, would patient like referral for services when discharged?: Yes (What county?) [facility]) Does patient have financial barriers related to discharge medications?: No  Summary/Recommendations:   Summary and Recommendations (to be completed by the evaluator): Patient is a 45 year old female, with a diagnosis of schizoaffective, on admission.  Patient presented to the hospital with altered mental status and delusions after she reports having a heat stroke.  Patient reports primary trigger for admission was the heat stroke. Patient will benefit from crisis stabilization, medication evaluation, group therapy and psycho education in addition to case management for discharge planning. At discharge, it is recommended that patient remain compliant with established discharge plan and continued treatment.    Pt presents with blunted affect and mood.  Pt reports having diarrhea and vomiting, and this is why she hasn't left her room or gone to groups.  Pt states that she is usually very social and outgoing but the heat stroke is affecting her mental state.  Pt lives in [place] in an apartment alone.  Pt is interested in following up with her PCP after discharge.  Pt is not a smoker so [place] Quitline N/A.     [name]. 02/20/2016

## 2016-02-20 NOTE — BHH Group Notes (Signed)
[facility] Group Notes:  (Nursing/MHT/Case Management/Adjunct)  Date:  02/20/2016  Time:  3:49 PM  Type of Therapy:  Psychoeducational Skills  Participation Level:  Did Not Attend  Participation Quality:  Did Not Attend  Affect:  Did Not Attend  Cognitive:  Did Not Attend  Insight:  None  Engagement in Group:  Did Not Attend  Modes of Intervention:  Did Not Attend  Summary of Progress/Problems: Pt did not attend patient self inventory group.   Jacquelyne Balint Shanta 02/20/2016, 3:49 PM

## 2016-02-20 NOTE — Progress Notes (Signed)
Patient ID: Meghan Hensley, female   DOB: 1971-05-25, 45 y.o.   MRN: [id number]   D: Pt remained very flat and depressed on the unit today. Pt was in the bed most of the day reporting that she did not feel well. Pt reported that her depression was a 2, her hopelessness was a 1, and her anxiety was a 2. Pt reported that her goal for today was to get better. Pt reported being negative SI/HI, no AH/VH noted. A: 15 min checks continued for patient safety. R: Pt safety maintained.

## 2016-02-20 NOTE — BH Assessment (Signed)
CSW attempted to complete PSA with pt at this time but pt was sleeping and did not wake with CSW's attempts.  CSW will attempt later.    [name], LCSW 02/20/2016, 9:57 AM

## 2016-02-20 NOTE — BHH Suicide Risk Assessment (Signed)
B[facility] INPATIENT: Family/Significant Other Suicide Prevention Education   Suicide Prevention Education:  Education Completed; No one has been identified by the patient as the family member/significant other with whom the patient will be residing, and identified as the person(s) who will aid the patient in the event of a mental health crisis (suicidal ideations/suicide attempt).   Pt did not c/o SI at admission, nor have they endorsed SI during their stay here. SPE not required. SPI pamphlet provided to pt and he was encouraged to share information with his support network, ask questions, and talk about any concerns.   The suicide prevention education provided includes the following:  Suicide risk factors  Suicide prevention and interventions  National Suicide Hotline telephone number  [facility] assessment telephone number  [facility] Emergency Assistance 911  [facility] and/or Residential Mobile Crisis Unit telephone number  Meghan Hensley, [place] 02/20/2016  3:21 PM

## 2016-02-20 NOTE — Plan of Care (Signed)
Problem: Safety: Goal: Periods of time without injury will increase Outcome: Progressing Pt has not engaged in self harm this weekend

## 2016-02-20 NOTE — Progress Notes (Signed)
[facility] MD Progress Note  02/20/2016 3:47 PM Meghan Hensley  MRN:  [id number]   Subjective:  "I've been up and went to some groups.  Im tired today." Objective:  [name].  Per nursing, she tried to sit in day room for periods of time.  Compliant with her medications.    Principal Problem: Bipolar affective disorder, current episode depressed (HCC) Diagnosis:   Patient Active Problem List   Diagnosis Date Noted  . Bipolar affective disorder, current episode depressed (HCC) [F31.30] 02/17/2016    Priority: High  . Schizoaffective disorder, depressive type (HCC) [F25.1] 02/17/2016   Total Time spent with patient: 30 minutes  Past Psychiatric History: see HPI   Past Medical History:  Past Medical History:  Diagnosis Date  . Bipolar 1 disorder (HCC)   . Schizophrenia (HCC)   . Thyroid disease    History reviewed. No pertinent surgical history. Family History: History reviewed. No pertinent family history. Family Psychiatric  History: see HPI Social History:  History  Alcohol Use No     History  Drug use: Unknown    Social History   Social History  . Marital status: Divorced    Spouse name: N/A  . Number of children: N/A  . Years of education: N/A   Social History Main Topics  . Smoking status: Never Smoker  . Smokeless tobacco: None  . Alcohol use No  . Drug use: Unknown  . Sexual activity: Not Asked   Other Topics Concern  . None   Social History Narrative  . None   Additional Social History:                         Sleep: Good  Appetite:  Good  Current Medications: Current Facility-Administered Medications  Medication Dose Route Frequency Provider Last Rate Last Dose  . acetaminophen (TYLENOL) tablet 650 mg  650 mg Oral Q6H PRN Charm Rings, NP      . alum & mag hydroxide-simeth (MAALOX/MYLANTA) 200-200-20 MG/5ML suspension 30 mL  30 mL Oral Q4H PRN Charm Rings, NP      . FLUoxetine (PROZAC)  capsule 20 mg  20 mg Oral Daily Rockey Situ Cobos, MD      . gabapentin (NEURONTIN) capsule 200 mg  200 mg Oral BID Charm Rings, NP   200 mg at 02/19/16 1558  . hydrOXYzine (ATARAX/VISTARIL) tablet 25 mg  25 mg Oral Q6H PRN Charm Rings, NP   25 mg at 02/17/16 2359  . levothyroxine (SYNTHROID, LEVOTHROID) tablet 200 mcg  200 mcg Oral QAC breakfast Charm Rings, NP   200 mcg at 02/20/16 0539  . loperamide (IMODIUM) capsule 2-4 mg  2-4 mg Oral PRN Adonis Brook, NP   4 mg at 02/20/16 0539  . magnesium hydroxide (MILK OF MAGNESIA) suspension 30 mL  30 mL Oral Daily PRN Charm Rings, NP      . ondansetron (ZOFRAN-ODT) disintegrating tablet 4 mg  4 mg Oral Q6H PRN Adonis Brook, NP   4 mg at 02/20/16 1205  . traZODone (DESYREL) tablet 50 mg  50 mg Oral QHS PRN Craige Cotta, MD   50 mg at 02/19/16 2113    Lab Results: No results found for this or any previous visit (from the past 48 hour(s)).  Blood Alcohol level:  Lab Results  Component Value Date   ETH <5 02/17/2016   ETH <5 09/21/2015  Metabolic Disorder Labs: No results found for: HGBA1C, MPG No results found for: PROLACTIN No results found for: CHOL, TRIG, HDL, CHOLHDL, VLDL, LDLCALC  Physical Findings: AIMS: Facial and Oral Movements Muscles of Facial Expression: None, normal Lips and Perioral Area: None, normal Jaw: None, normal Tongue: None, normal,Extremity Movements Upper (arms, wrists, hands, fingers): None, normal Lower (legs, knees, ankles, toes): None, normal, Trunk Movements Neck, shoulders, hips: None, normal, Overall Severity Severity of abnormal movements (highest score from questions above): None, normal Incapacitation due to abnormal movements: None, normal Patient's awareness of abnormal movements (rate only patient's report): No Awareness, Dental Status Current problems with teeth and/or dentures?: No Does patient usually wear dentures?: No  CIWA:    COWS:     Musculoskeletal: Strength &  Muscle Tone: within normal limits Gait & Station: normal Patient leans: N/A  Psychiatric Specialty Exam: Physical Exam  Nursing note and vitals reviewed. Cardiovascular: Normal rate.   Psychiatric: Judgment and thought content normal. Her mood appears anxious. She is slowed. Cognition and memory are normal. She exhibits a depressed mood.    Review of Systems  Constitutional: Negative.   HENT: Negative.   Eyes: Negative.   Respiratory: Negative.   Cardiovascular: Negative.   Gastrointestinal: Negative.   Genitourinary: Negative.   Musculoskeletal: Negative.   Skin: Negative.   Neurological: Negative.   Endo/Heme/Allergies: Negative.   Psychiatric/Behavioral: Positive for depression. The patient is nervous/anxious.     Blood pressure (!) 100/54, pulse (!) 105, temperature 98.7 F (37.1 C), temperature source Oral, resp. rate 18, height 5' 10.5" (1.791 m), weight (!) 144.2 kg (318 lb).Body mass index is 44.98 kg/m.  General Appearance: Neat  Eye Contact:  Good  Speech:  Normal Rate  Volume:  Normal  Mood:  Euthymic  Affect:  Congruent  Thought Process:  Coherent  Orientation:  Full (Time, Place, and Person)  Thought Content:  Rumination  Suicidal Thoughts:  No  Homicidal Thoughts:  No  Memory:  Immediate;   Fair Recent;   Fair Remote;   Fair  Judgement:  Fair  Insight:  Fair  Psychomotor Activity:  Normal  Concentration:  Concentration: Good and Attention Span: Good  Recall:  [employer] of Knowledge:  Fair  Language:  Fair  Akathisia:  Negative  Handed:  Right  AIMS (if indicated):     Assets:  Desire for Improvement Resilience  ADL's:  Intact  Cognition:  WNL  Sleep:  Number of Hours: 5.5     Treatment Plan Summary: Review of chart, vital signs, medications, and notes.  1-Individual and group therapy  2-Medication management for depression and anxiety: Medications reviewed with the patient and she stated no untoward effects, unchanged.   Depression: Prozac  20 mg   Insomnia: Trazodone 50 mg  3-Coping skills for depression, anxiety  4-Continue crisis stabilization and management  5-Address health issues--monitoring vital signs, stable  6-Treatment plan in progress to prevent relapse of depression and anxiety  Meghan May Agustin, NP [facility] 02/20/2016, 3:47 PM   Reviewed the information documented and agree with the treatment plan.  Meghan Hensley 02/21/2016 11:16 AM

## 2016-02-20 NOTE — Progress Notes (Signed)
Adult Psychoeducational Group Note  Date:  02/20/2016 Time:  8:15 PM  Group Topic/Focus:  Wrap-Up Group:   The focus of this group is to help patients review their daily goal of treatment and discuss progress on daily workbooks.   Participation Level:  Did Not Attend  Pt did not attend wrap-up group.    Cleotilde Neer 02/20/2016, 8:27 PM

## 2016-02-21 DIAGNOSIS — R600 Localized edema: Secondary | ICD-10-CM | POA: Diagnosis present

## 2016-02-21 DIAGNOSIS — E039 Hypothyroidism, unspecified: Secondary | ICD-10-CM | POA: Diagnosis present

## 2016-02-21 DIAGNOSIS — F250 Schizoaffective disorder, bipolar type: Principal | ICD-10-CM

## 2016-02-21 MED ORDER — LEVOTHYROXINE SODIUM 125 MCG PO TABS
250.0000 ug | ORAL_TABLET | Freq: Every day | ORAL | Status: DC
  Administered 2016-02-22 – 2016-02-25 (×4): 250 ug via ORAL
  Filled 2016-02-21 (×5): qty 2
  Filled 2016-02-21: qty 14

## 2016-02-21 MED ORDER — LAMOTRIGINE 25 MG PO TABS
25.0000 mg | ORAL_TABLET | Freq: Every day | ORAL | Status: DC
  Administered 2016-02-21 – 2016-02-24 (×4): 25 mg via ORAL
  Filled 2016-02-21 (×2): qty 1
  Filled 2016-02-21: qty 7
  Filled 2016-02-21 (×4): qty 1

## 2016-02-21 NOTE — Progress Notes (Signed)
[facility] MD Progress Note  02/21/2016 3:07 PM Meghan Hensley  MRN:  [id number]   Subjective:  "I feel OK today , my diarrhea is clearing up. But I have taken prozac in the past and I always felt elated when I took it. I have a hx of mania as a precursor to my thyroid problems . I also have had sleep issues."  Objective:  Patient seen and chart reviewed.Discussed patient with treatment team.  Pt today is seen as with BL edema of LE - Pt with extremely high TSH - possibly contributing to her presentation. Pt denies any other medical issues. Pt states she was not compliant on her thyroid medications prior to admission. Pt with GI sx of diarrhea and vomiting on her prozac , also reports past hx of hypomanic sx on prozac. Hence discussed changing it to lamictal , she also reports hx of mood swings. Per staff - pt with GI sx resolving , continues to need encouragement and support.     Principal Problem: Schizoaffective disorder, bipolar type (HCC) Diagnosis:   Patient Active Problem List   Diagnosis Date Noted  . Hypothyroidism [E03.9] 02/21/2016  . Bilateral edema of lower extremity [R60.0] 02/21/2016  . Schizoaffective disorder, bipolar type (HCC) [F25.0] 02/17/2016   Total Time spent with patient: 25 minutes  Past Psychiatric History: see HPI   Past Medical History:  Past Medical History:  Diagnosis Date  . Bipolar 1 disorder (HCC)   . Schizophrenia (HCC)   . Thyroid disease    History reviewed. No pertinent surgical history. Family History: Please see H&P.  Family Psychiatric  History: see HPI Social History:  History  Alcohol Use No     History  Drug use: Unknown    Social History   Social History  . Marital status: Divorced    Spouse name: N/A  . Number of children: N/A  . Years of education: N/A   Social History Main Topics  . Smoking status: Never Smoker  . Smokeless tobacco: None  . Alcohol use No  . Drug use: Unknown  . Sexual activity: Not Asked   Other  Topics Concern  . None   Social History Narrative  . None   Additional Social History:                         Sleep: Fair  Appetite:  Poor  Current Medications: Current Facility-Administered Medications  Medication Dose Route Frequency Provider Last Rate Last Dose  . acetaminophen (TYLENOL) tablet 650 mg  650 mg Oral Q6H PRN Charm Rings, NP   650 mg at 02/21/16 1116  . alum & mag hydroxide-simeth (MAALOX/MYLANTA) 200-200-20 MG/5ML suspension 30 mL  30 mL Oral Q4H PRN Charm Rings, NP      . gabapentin (NEURONTIN) capsule 200 mg  200 mg Oral BID Charm Rings, NP   200 mg at 02/21/16 0813  . hydrOXYzine (ATARAX/VISTARIL) tablet 25 mg  25 mg Oral Q6H PRN Charm Rings, NP   25 mg at 02/17/16 2359  . lamoTRIgine (LAMICTAL) tablet 25 mg  25 mg Oral QHS Jomarie Longs, MD      . Melene Muller ON 02/22/2016] levothyroxine (SYNTHROID, LEVOTHROID) tablet 250 mcg  250 mcg Oral QAC breakfast Sanjuana Kava, NP      . loperamide (IMODIUM) capsule 2-4 mg  2-4 mg Oral PRN Adonis Brook, NP   4 mg at 02/20/16 2214  . magnesium hydroxide (MILK OF MAGNESIA) suspension  30 mL  30 mL Oral Daily PRN Charm Rings, NP      . ondansetron (ZOFRAN-ODT) disintegrating tablet 4 mg  4 mg Oral Q6H PRN Adonis Brook, NP   4 mg at 02/20/16 2214  . traZODone (DESYREL) tablet 50 mg  50 mg Oral QHS PRN Craige Cotta, MD   50 mg at 02/20/16 2214    Lab Results: No results found for this or any previous visit (from the past 48 hour(s)).  Blood Alcohol level:  Lab Results  Component Value Date   ETH <5 02/17/2016   ETH <5 09/21/2015    Metabolic Disorder Labs: No results found for: HGBA1C, MPG No results found for: PROLACTIN No results found for: CHOL, TRIG, HDL, CHOLHDL, VLDL, LDLCALC  Physical Findings: AIMS: Facial and Oral Movements Muscles of Facial Expression: None, normal Lips and Perioral Area: None, normal Jaw: None, normal Tongue: None, normal,Extremity Movements Upper (arms,  wrists, hands, fingers): None, normal Lower (legs, knees, ankles, toes): None, normal, Trunk Movements Neck, shoulders, hips: None, normal, Overall Severity Severity of abnormal movements (highest score from questions above): None, normal Incapacitation due to abnormal movements: None, normal Patient's awareness of abnormal movements (rate only patient's report): No Awareness, Dental Status Current problems with teeth and/or dentures?: No Does patient usually wear dentures?: No  CIWA:    COWS:     Musculoskeletal: Strength & Muscle Tone: within normal limits Gait & Station: normal Patient leans: N/A  Psychiatric Specialty Exam: Physical Exam  Nursing note and vitals reviewed. Cardiovascular: Normal rate.   Psychiatric: Judgment and thought content normal. Her mood appears anxious. She is slowed. Cognition and memory are normal. She exhibits a depressed mood.    Review of Systems  Constitutional: Negative.   HENT: Negative.   Eyes: Negative.   Respiratory: Negative.   Cardiovascular: Negative.   Gastrointestinal: Negative.   Genitourinary: Negative.   Musculoskeletal: Negative.   Skin: Negative.   Neurological: Negative.   Endo/Heme/Allergies: Negative.   Psychiatric/Behavioral: Positive for depression. The patient is nervous/anxious.   All other systems reviewed and are negative.   Blood pressure 109/62, pulse 91, temperature 98.6 F (37 C), temperature source Oral, resp. rate 12, height 5' 10.5" (1.791 m), weight (!) 144.2 kg (318 lb).Body mass index is 44.98 kg/m.  General Appearance: Fairly Groomed  Eye Contact:  Good  Speech:  Pressured  Volume:  Normal  Mood:  Anxious  Affect:  Congruent  Thought Process:  Coherent  Orientation:  Full (Time, Place, and Person)  Thought Content:  Delusions and Rumination  Suicidal Thoughts:  No  Homicidal Thoughts:  No  Memory:  Immediate;   Fair Recent;   Fair Remote;   Fair  Judgement:  Fair  Insight:  Fair  Psychomotor  Activity:  Normal  Concentration:  Concentration: Good and Attention Span: Good  Recall:  [employer] of Knowledge:  Fair  Language:  Fair  Akathisia:  Negative  Handed:  Right  AIMS (if indicated):     Assets:  Desire for Improvement Resilience  ADL's:  Intact  Cognition:  WNL  Sleep:  Number of Hours: 5.5     Treatment Plan Summary: Review of chart, vital signs, medications, and notes.  1-Individual and group therapy  2-Medication management for depression and anxiety: Medications reviewed with the patient and she stated no untoward effects, unchanged.   Depression:Will discontinue Prozac and start Lamictal 25 mg po qhs for mood sx. Insomnia: Trazodone 50 mg po qhs prn for  sleep. 3-Coping skills for depression, anxiety  4-Continue crisis stabilization and management  5-Address health issues--monitoring vital signs. Hospitalist consult placed by Aggie NP regarding patient's TSH level and LE edema. Please see consult notes. Will order vitamin b12, folate , rpr , UA , uds- if not already done. 6-Treatment plan in progress to prevent relapse of depression and anxiety   Emersen Carroll 02/21/2016 3:07 PM

## 2016-02-21 NOTE — Progress Notes (Signed)
D: Patient denies SI/HI and A/V hallucinations; patient reports sleep is fair; reports appetite is poor ; reports energy level is low ; reports ability to concentrate is poor; rates depression as 2/10; rates hopelessness 1/10; rates anxiety as 2/10; patient reported that she was having some diarrhea and nausea last night; patient reported some some in the morning but did not want anything else at this time because it was not a lot; patient also reported back pain that she believed is due to the bed;   A: Monitored q 15 minutes; patient encouraged to attend groups; patient educated about medications; patient given medications per physician orders; patient encouraged to express feelings and/or concerns  R: Patient reports relief of the diarrhea and vomiting; patient is pleasant; patient reports that her thoughts are clearer; patient appears to not need as much redirection; patient's interaction with staff is appropriate; patient's interaction with peers is minimal; patient was able to set goal to talk with staff 1:1 when having feelings of SI; patient is taking medications as prescribed and tolerating medications; patient is attending some groups

## 2016-02-21 NOTE — Progress Notes (Signed)
Patient ID: Meghan Hensley, female   DOB: [birth year], 45 y.o.   MRN: [id number] PER STATE REGULATIONS 482.30  THIS CHART WAS REVIEWED FOR MEDICAL NECESSITY WITH RESPECT TO THE PATIENT'S ADMISSION/ DURATION OF STAY.  NEXT REVIEW DATE: 02/25/2016  Willa Rough, RN, BSN CASE MANAGER

## 2016-02-21 NOTE — Significant Event (Signed)
Phone encounter Received a call from behavioral health about patient persistent elevated TSH. TSH is unmeasurable, >90.000 IU/mL. Reportedly patient is on 200 g of Synthroid since February 21. I recommended to increase TSH to 250 g, check Synthroid in 4 weeks. Concern about lower extremity swelling, this is likely secondary to severe hypothyroidism, pretibial edema not uncommon with severe hypothyroidism.  Clint Lipps Pager: [phone] 02/21/2016, 3:02 PM

## 2016-02-21 NOTE — [facility] Group Notes (Signed)
[facility] LCSW Group Therapy  02/21/2016 1:15 pm  Type of Therapy: Process Group Therapy  Participation Level:  Active  Participation Quality:  Appropriate  Affect:  Flat  Cognitive:  Oriented  Insight:  Improving  Engagement in Group:  Limited  Engagement in Therapy:  Limited  Modes of Intervention:  Activity, Clarification, Education, Problem-solving and Support  Summary of Progress/Problems: Today's group addressed the issue of overcoming obstacles.  Patients were asked to identify their biggest obstacle post d/c that stands in the way of their on-going success, and then problem solve as to how to manage this.  Came in with about 15 minutes left in group.  Shared that forgetting to take meds is "an environmental problem" for her.  Stated this is a good environment to get her on track, that she has been off track since her father died a year ago, and that when she gets her check next month she should be able to get into a decent environment again.  Meghan Hensley 02/21/2016   4:34 PM

## 2016-02-21 NOTE — Progress Notes (Signed)
Did not attend group 

## 2016-02-22 LAB — URINALYSIS W MICROSCOPIC (NOT AT ARMC)
Bilirubin Urine: NEGATIVE
GLUCOSE, UA: NEGATIVE mg/dL
Hgb urine dipstick: NEGATIVE
Ketones, ur: NEGATIVE mg/dL
Leukocytes, UA: NEGATIVE
Nitrite: NEGATIVE
PROTEIN: NEGATIVE mg/dL
Specific Gravity, Urine: 1.01 (ref 1.005–1.030)
WBC UA: NONE SEEN WBC/hpf (ref 0–5)
pH: 5.5 (ref 5.0–8.0)

## 2016-02-22 LAB — RAPID URINE DRUG SCREEN, HOSP PERFORMED
AMPHETAMINES: NOT DETECTED
BENZODIAZEPINES: NOT DETECTED
Barbiturates: NOT DETECTED
COCAINE: NOT DETECTED
OPIATES: NOT DETECTED
Tetrahydrocannabinol: NOT DETECTED

## 2016-02-22 LAB — FOLATE: Folate: 16.4 ng/mL (ref >5.9)

## 2016-02-22 LAB — VITAMIN B12: Vitamin B-12: 183 pg/mL (ref 180–914)

## 2016-02-22 LAB — RPR: RPR Ser Ql: NONREACTIVE

## 2016-02-22 MED ORDER — TRAZODONE HCL 150 MG PO TABS
75.0000 mg | ORAL_TABLET | Freq: Every day | ORAL | Status: DC
  Administered 2016-02-22: 75 mg via ORAL
  Filled 2016-02-22 (×3): qty 0.5

## 2016-02-22 MED ORDER — VITAMIN B-12 100 MCG PO TABS
100.0000 ug | ORAL_TABLET | Freq: Every day | ORAL | Status: DC
  Administered 2016-02-22 – 2016-02-25 (×4): 100 ug via ORAL
  Filled 2016-02-22 (×6): qty 1

## 2016-02-22 NOTE — Progress Notes (Signed)
DAR NOTE: Patient presents with depressed mood and  Affect. Denies auditory and visual hallucinations.  Rates depression at 2, hopelessness at 1, and anxiety at 1.  Described energy level as low and concentration as improved.  Maintained on routine safety checks.  Medications given as prescribed.  Support and encouragement offered as needed.  Attended group and participated.  States goal for today is "stabilize pain and release plan."  Patient remained isolative to her room.

## 2016-02-22 NOTE — [facility] Group Notes (Signed)
[facility] Group Notes:  (Nursing/MHT/Case Management/Adjunct)  Date:  02/22/2016  Time:  11:08 AM  Type of Therapy:  Nurse Education  Participation Level:  Active  Participation Quality:  Appropriate and Attentive  Affect:  Appropriate  Cognitive:  Alert and Appropriate  Insight:  Appropriate and Good  Engagement in Group:  Engaged  Modes of Intervention:  Discussion and Education  Summary of Progress/Problems: Topic was on Recovery.  Recovery means different things to different people.  Recovery is a process.  Patient encouraged to start the process gradually but steadily maintain progress.  Patient was receptive and participated.  Patient states, "getting a good night rest and free of body pain is recovery to her."  [name] 02/22/2016, 11:08 AM

## 2016-02-22 NOTE — Progress Notes (Signed)
[facility] MD Progress Note  02/22/2016 2:45 PM Meghan Hensley  MRN:  [id number]   Subjective:  "I did not sleep all that well last night. But my mood swings are better than yesterday.'    Objective:  Patient seen and chart reviewed.Discussed patient with treatment team.  Pt today continues to have mood sx, and also reports sleep issues. Pt tolerating Lamictal well. Pt with BL edema of LE - Pt with extremely high TSH - possibly contributing to her presentation.Per hospitalist recommendations her levothyroxine was increased. Will continue to offer symptomatic care for edema. Per staff - pt continues to need encouragement and support.     Principal Problem: Schizoaffective disorder, bipolar type (HCC) Diagnosis:   Patient Active Problem List   Diagnosis Date Noted  . Hypothyroidism [E03.9] 02/21/2016  . Bilateral edema of lower extremity [R60.0] 02/21/2016  . Schizoaffective disorder, bipolar type (HCC) [F25.0] 02/17/2016   Total Time spent with patient: 25 minutes  Past Psychiatric History: see HPI   Past Medical History:  Past Medical History:  Diagnosis Date  . Bipolar 1 disorder (HCC)   . Schizophrenia (HCC)   . Thyroid disease    History reviewed. No pertinent surgical history. Family History: Please see H&P.  Family Psychiatric  History: see HPI Social History:  History  Alcohol Use No     History  Drug use: Unknown    Social History   Social History  . Marital status: Divorced    Spouse name: N/A  . Number of children: N/A  . Years of education: N/A   Social History Main Topics  . Smoking status: Never Smoker  . Smokeless tobacco: None  . Alcohol use No  . Drug use: Unknown  . Sexual activity: Not Asked   Other Topics Concern  . None   Social History Narrative  . None   Additional Social History:                         Sleep: Poor  Appetite:  Poor  Current Medications: Current Facility-Administered Medications  Medication Dose  Route Frequency Provider Last Rate Last Dose  . acetaminophen (TYLENOL) tablet 650 mg  650 mg Oral Q6H PRN Charm Rings, NP   650 mg at 02/21/16 1116  . alum & mag hydroxide-simeth (MAALOX/MYLANTA) 200-200-20 MG/5ML suspension 30 mL  30 mL Oral Q4H PRN Charm Rings, NP      . gabapentin (NEURONTIN) capsule 200 mg  200 mg Oral BID Charm Rings, NP   200 mg at 02/22/16 0845  . hydrOXYzine (ATARAX/VISTARIL) tablet 25 mg  25 mg Oral Q6H PRN Charm Rings, NP   25 mg at 02/17/16 2359  . lamoTRIgine (LAMICTAL) tablet 25 mg  25 mg Oral QHS Jomarie Longs, MD   25 mg at 02/21/16 2135  . levothyroxine (SYNTHROID, LEVOTHROID) tablet 250 mcg  250 mcg Oral QAC breakfast Sanjuana Kava, NP   250 mcg at 02/22/16 [phone]  . loperamide (IMODIUM) capsule 2-4 mg  2-4 mg Oral PRN Adonis Brook, NP   2 mg at 02/21/16 2135  . magnesium hydroxide (MILK OF MAGNESIA) suspension 30 mL  30 mL Oral Daily PRN Charm Rings, NP      . ondansetron (ZOFRAN-ODT) disintegrating tablet 4 mg  4 mg Oral Q6H PRN Adonis Brook, NP   4 mg at 02/20/16 2214  . traZODone (DESYREL) tablet 75 mg  75 mg Oral QHS Jomarie Longs, MD      .  vitamin B-12 (CYANOCOBALAMIN) tablet 100 mcg  100 mcg Oral Daily Jomarie Longs, MD        Lab Results:  Results for orders placed or performed during the hospital encounter of 02/17/16 (from the past 48 hour(s))  Urinalysis with microscopic (not at [facility])     Status: Abnormal   Collection Time: 02/21/16  7:07 PM  Result Value Ref Range   Color, Urine YELLOW YELLOW   APPearance CLEAR CLEAR   Specific Gravity, Urine 1.010 1.005 - 1.030   pH 5.5 5.0 - 8.0   Glucose, UA NEGATIVE NEGATIVE mg/dL   Hgb urine dipstick NEGATIVE NEGATIVE   Bilirubin Urine NEGATIVE NEGATIVE   Ketones, ur NEGATIVE NEGATIVE mg/dL   Protein, ur NEGATIVE NEGATIVE mg/dL   Nitrite NEGATIVE NEGATIVE   Leukocytes, UA NEGATIVE NEGATIVE   WBC, UA NONE SEEN 0 - 5 WBC/hpf   RBC / HPF 0-5 0 - 5 RBC/hpf   Bacteria, UA RARE (A)  NONE SEEN   Squamous Epithelial / LPF 0-5 (A) NONE SEEN    Comment: Performed at [facility]  Rapid urine drug screen (hospital performed)     Status: None   Collection Time: 02/21/16  7:07 PM  Result Value Ref Range   Opiates NONE DETECTED NONE DETECTED   Cocaine NONE DETECTED NONE DETECTED   Benzodiazepines NONE DETECTED NONE DETECTED   Amphetamines NONE DETECTED NONE DETECTED   Tetrahydrocannabinol NONE DETECTED NONE DETECTED   Barbiturates NONE DETECTED NONE DETECTED    Comment:        DRUG SCREEN FOR MEDICAL PURPOSES ONLY.  IF CONFIRMATION IS NEEDED FOR ANY PURPOSE, NOTIFY LAB WITHIN 5 DAYS.        LOWEST DETECTABLE LIMITS FOR URINE DRUG SCREEN Drug Class       Cutoff (ng/mL) Amphetamine      1000 Barbiturate      200 Benzodiazepine   200 Tricyclics       300 Opiates          300 Cocaine          300 THC              50 Performed at [facility]   Vitamin B12     Status: None   Collection Time: 02/22/16  6:52 AM  Result Value Ref Range   Vitamin B-12 183 180 - 914 pg/mL    Comment: (NOTE) This assay is not validated for testing neonatal or myeloproliferative syndrome specimens for Vitamin B12 levels. Performed at [facility]   Folate     Status: None   Collection Time: 02/22/16  6:52 AM  Result Value Ref Range   Folate 16.4 >5.9 ng/mL    Comment: Performed at [facility]  RPR     Status: None   Collection Time: 02/22/16  6:52 AM  Result Value Ref Range   RPR Ser Ql Non Reactive Non Reactive    Comment: (NOTE) Performed At: [facility] [address] [place], [place] [id number] Mila Homer MD [id number] Performed at [facility]     Blood Alcohol level:  Lab Results  Component Value Date   [facility] <5 02/17/2016   ETH <5 09/21/2015    Metabolic Disorder Labs: No results found for: HGBA1C, MPG No results found for: PROLACTIN No results found for: CHOL, TRIG, HDL,  CHOLHDL, VLDL, LDLCALC  Physical Findings: AIMS: Facial and Oral Movements Muscles of Facial Expression: None, normal Lips and Perioral  Area: None, normal Jaw: None, normal Tongue: None, normal,Extremity Movements Upper (arms, wrists, hands, fingers): None, normal Lower (legs, knees, ankles, toes): None, normal, Trunk Movements Neck, shoulders, hips: None, normal, Overall Severity Severity of abnormal movements (highest score from questions above): None, normal Incapacitation due to abnormal movements: None, normal Patient's awareness of abnormal movements (rate only patient's report): No Awareness, Dental Status Current problems with teeth and/or dentures?: No Does patient usually wear dentures?: No  CIWA:    COWS:     Musculoskeletal: Strength & Muscle Tone: within normal limits Gait & Station: normal Patient leans: N/A  Psychiatric Specialty Exam: Physical Exam  Nursing note and vitals reviewed. Cardiovascular: Normal rate.   Psychiatric: Judgment and thought content normal. Her mood appears anxious. She is slowed. Cognition and memory are normal. She exhibits a depressed mood.    Review of Systems  Constitutional: Negative.   HENT: Negative.   Eyes: Negative.   Respiratory: Negative.   Cardiovascular: Negative.   Gastrointestinal: Negative.   Genitourinary: Negative.   Musculoskeletal: Negative.   Skin: Negative.   Neurological: Negative.   Endo/Heme/Allergies: Negative.   Psychiatric/Behavioral: Positive for depression. The patient is nervous/anxious and has insomnia.   All other systems reviewed and are negative.   Blood pressure 112/60, pulse 93, temperature 98.4 F (36.9 C), temperature source Oral, resp. rate 20, height 5' 10.5" (1.791 m), weight (!) 144.2 kg (318 lb).Body mass index is 44.98 kg/m.  General Appearance: Fairly Groomed  Eye Contact:  Minimal  Speech:  Normal Rate  Volume:  Normal  Mood:  Anxious and Depressed  Affect:  Congruent  Thought  Process:  Coherent  Orientation:  Full (Time, Place, and Person)  Thought Content:  Delusions and Rumination  Suicidal Thoughts:  No  Homicidal Thoughts:  No  Memory:  Immediate;   Fair Recent;   Fair Remote;   Fair  Judgement:  Fair  Insight:  Fair  Psychomotor Activity:  Normal  Concentration:  Concentration: Good and Attention Span: Good  Recall:  [employer] of Knowledge:  Fair  Language:  Fair  Akathisia:  Negative  Handed:  Right  AIMS (if indicated):     Assets:  Desire for Improvement Resilience  ADL's:  Intact  Cognition:  WNL  Sleep:  Number of Hours: 5.75     Treatment Plan Summary: Review of chart, vital signs, medications, and notes.  1-Individual and group therapy  2-Medication management for depression and anxiety: Medications reviewed with the patient and she stated no untoward effects, unchanged.   Depression:Will continue Lamictal 25 mg po qhs for mood sx. Insomnia:Will increase Trazodone to 75 mg po qhs for sleep. 3-Coping skills for depression, anxiety  4-Continue crisis stabilization and management  5-Address health issues--monitoring vital signs. Hospitalist consult placed by Aggie NP regarding patient's TSH level and LE edema. Please see consult notes.Will continue increased dose of synthroid - 250 mcg daily. Will also replace Vitamin b12 - since it is low. 6-Treatment plan in progress to prevent relapse of depression and anxiety   Senai Kingsley 02/22/2016 2:45 PM

## 2016-02-22 NOTE — Progress Notes (Signed)
Patient ID: Meghan Hensley, female   DOB: 1971/02/13, 45 y.o.   MRN: [id number] D: Patient sitting in dayroom on approach. Pt reports her day is getting better since been on medication. Pt endorses AH reports "voices is faint" compared to time of admission. Pt denies SI/HI/VH and pain. Pt attended evening wrap up group and Interacted appropriately with peers. Cooperative with assessment.   A: Met with pt 1:1. Medications administered as prescribed. Writer encouraged pt to discuss feelings. Pt encouraged to come to staff with any question or concerns.   R: Patient remains safe. She is complaint with medications and denies any adverse reaction.

## 2016-02-22 NOTE — [facility] Group Notes (Signed)
[facility] LCSW Group Therapy  02/22/2016 , 1:56 PM   Type of Therapy:  Group Therapy  Participation Level:  Active  Participation Quality:  Attentive  Affect:  Appropriate  Cognitive:  Alert  Insight:  Improving  Engagement in Therapy:  Engaged  Modes of Intervention:  Discussion, Exploration and Socialization  Summary of Progress/Problems: Today's group focused on the term Diagnosis.  Participants were asked to define the term, and then pronounce whether it is a negative, positive or neutral term.  Stayed the entire time, engaged throughout.  Talked about the death of her father and her on-going recovery from that.  On one hand minimizes impact, and the other hand states that is a contributor to why she is here.  Presented in her meandering, pressured manner.  [name] 02/22/2016 , 1:56 PM

## 2016-02-22 NOTE — [facility] Group Notes (Signed)
Patient attend group. Her day was a 6. This was best day since she been here. They got her medicine correct. She got rest. She not laying on the little bed. She has a hospital bed. She can rest with comfort. She meet her goal.

## 2016-02-23 MED ORDER — TRAZODONE HCL 50 MG PO TABS
50.0000 mg | ORAL_TABLET | Freq: Every evening | ORAL | Status: DC | PRN
  Administered 2016-02-23 – 2016-02-24 (×2): 50 mg via ORAL
  Filled 2016-02-23 (×2): qty 1
  Filled 2016-02-23: qty 7

## 2016-02-23 MED ORDER — TRAZODONE HCL 50 MG PO TABS: 50.0000 mg | ORAL_TABLET | Freq: Every day | ORAL | Status: DC

## 2016-02-23 NOTE — Progress Notes (Signed)
Patient ID: Meghan Hensley, female   DOB: [birth year], 45 y.o.   MRN: [id number]   Patient has been in her room asleep for the majority of the shift other than to attend group and for meals.  Patient stated that the auditory hallucinations were decreasing as well as her depression and she was feeling more like herself.  Patient had no complaints of pain and denies VH, SI and HI.  Assess patient for safety, offer medications as prescribed, engage patient in 1:1 staff talks.   Continue to monitor patient for safety, patient able to contract for safety.

## 2016-02-23 NOTE — Progress Notes (Signed)
[facility] MD Progress Note  02/23/2016 3:28 PM Meghan Hensley  MRN:  [id number]   Subjective:  "I am tired , that medications is too strong.'     Objective:  Patient seen and chart reviewed.Discussed patient with treatment team.  Pt today seen in bed , appears to be lethargic , sleeping- reports that the trazodone increase was too much for her and that could be the reason for her feeling sleepy this AM. Pt tolerating Lamictal well. Pt continues to have  BL edema of LE - Pt with extremely high TSH - possibly contributing to her presentation.Per hospitalist recommendations her levothyroxine was increased yesterday. Per staff - pt continues to need encouragement and support.     Principal Problem: Schizoaffective disorder, bipolar type (HCC) Diagnosis:   Patient Active Problem List   Diagnosis Date Noted  . Hypothyroidism [E03.9] 02/21/2016  . Bilateral edema of lower extremity [R60.0] 02/21/2016  . Schizoaffective disorder, bipolar type (HCC) [F25.0] 02/17/2016   Total Time spent with patient: 20 minutes  Past Psychiatric History: see HPI   Past Medical History:  Past Medical History:  Diagnosis Date  . Bipolar 1 disorder (HCC)   . Schizophrenia (HCC)   . Thyroid disease    History reviewed. No pertinent surgical history. Family History: Please see H&P.  Family Psychiatric  History: see HPI Social History:  History  Alcohol Use No     History  Drug use: Unknown    Social History   Social History  . Marital status: Divorced    Spouse name: N/A  . Number of children: N/A  . Years of education: N/A   Social History Main Topics  . Smoking status: Never Smoker  . Smokeless tobacco: None  . Alcohol use No  . Drug use: Unknown  . Sexual activity: Not Asked   Other Topics Concern  . None   Social History Narrative  . None   Additional Social History:                         Sleep: Fair  Appetite:  Poor improving  Current Medications: Current  Facility-Administered Medications  Medication Dose Route Frequency Provider Last Rate Last Dose  . acetaminophen (TYLENOL) tablet 650 mg  650 mg Oral Q6H PRN Charm Rings, NP   650 mg at 02/21/16 1116  . alum & mag hydroxide-simeth (MAALOX/MYLANTA) 200-200-20 MG/5ML suspension 30 mL  30 mL Oral Q4H PRN Charm Rings, NP   30 mL at 02/23/16 0626  . gabapentin (NEURONTIN) capsule 200 mg  200 mg Oral BID Charm Rings, NP   200 mg at 02/23/16 4098  . hydrOXYzine (ATARAX/VISTARIL) tablet 25 mg  25 mg Oral Q6H PRN Charm Rings, NP   25 mg at 02/22/16 2118  . lamoTRIgine (LAMICTAL) tablet 25 mg  25 mg Oral QHS Jomarie Longs, MD   25 mg at 02/22/16 2118  . levothyroxine (SYNTHROID, LEVOTHROID) tablet 250 mcg  250 mcg Oral QAC breakfast Sanjuana Kava, NP   250 mcg at 02/23/16 1191  . magnesium hydroxide (MILK OF MAGNESIA) suspension 30 mL  30 mL Oral Daily PRN Charm Rings, NP      . traZODone (DESYREL) tablet 50 mg  50 mg Oral QHS Jomarie Longs, MD      . vitamin B-12 (CYANOCOBALAMIN) tablet 100 mcg  100 mcg Oral Daily Jomarie Longs, MD   100 mcg at 02/23/16 4782    Lab Results:  Results  for orders placed or performed during the hospital encounter of 02/17/16 (from the past 48 hour(s))  Urinalysis with microscopic (not at [facility])     Status: Abnormal   Collection Time: 02/21/16  7:07 PM  Result Value Ref Range   Color, Urine YELLOW YELLOW   APPearance CLEAR CLEAR   Specific Gravity, Urine 1.010 1.005 - 1.030   pH 5.5 5.0 - 8.0   Glucose, UA NEGATIVE NEGATIVE mg/dL   Hgb urine dipstick NEGATIVE NEGATIVE   Bilirubin Urine NEGATIVE NEGATIVE   Ketones, ur NEGATIVE NEGATIVE mg/dL   Protein, ur NEGATIVE NEGATIVE mg/dL   Nitrite NEGATIVE NEGATIVE   Leukocytes, UA NEGATIVE NEGATIVE   WBC, UA NONE SEEN 0 - 5 WBC/hpf   RBC / HPF 0-5 0 - 5 RBC/hpf   Bacteria, UA RARE (A) NONE SEEN   Squamous Epithelial / LPF 0-5 (A) NONE SEEN    Comment: Performed at [facility]  Rapid  urine drug screen (hospital performed)     Status: None   Collection Time: 02/21/16  7:07 PM  Result Value Ref Range   Opiates NONE DETECTED NONE DETECTED   Cocaine NONE DETECTED NONE DETECTED   Benzodiazepines NONE DETECTED NONE DETECTED   Amphetamines NONE DETECTED NONE DETECTED   Tetrahydrocannabinol NONE DETECTED NONE DETECTED   Barbiturates NONE DETECTED NONE DETECTED    Comment:        DRUG SCREEN FOR MEDICAL PURPOSES ONLY.  IF CONFIRMATION IS NEEDED FOR ANY PURPOSE, NOTIFY LAB WITHIN 5 DAYS.        LOWEST DETECTABLE LIMITS FOR URINE DRUG SCREEN Drug Class       Cutoff (ng/mL) Amphetamine      1000 Barbiturate      200 Benzodiazepine   200 Tricyclics       300 Opiates          300 Cocaine          300 THC              50 Performed at [facility]   Vitamin B12     Status: None   Collection Time: 02/22/16  6:52 AM  Result Value Ref Range   Vitamin B-12 183 180 - 914 pg/mL    Comment: (NOTE) This assay is not validated for testing neonatal or myeloproliferative syndrome specimens for Vitamin B12 levels. Performed at [facility]   Folate     Status: None   Collection Time: 02/22/16  6:52 AM  Result Value Ref Range   Folate 16.4 >5.9 ng/mL    Comment: Performed at [facility]  RPR     Status: None   Collection Time: 02/22/16  6:52 AM  Result Value Ref Range   RPR Ser Ql Non Reactive Non Reactive    Comment: (NOTE) Performed At: [facility] [address] [place], [place] [id number] Mila Homer MD [id number] Performed at [facility]     Blood Alcohol level:  Lab Results  Component Value Date   [facility] <5 02/17/2016   ETH <5 09/21/2015    Metabolic Disorder Labs: No results found for: HGBA1C, MPG No results found for: PROLACTIN No results found for: CHOL, TRIG, HDL, CHOLHDL, VLDL, LDLCALC  Physical Findings: AIMS: Facial and Oral Movements Muscles of Facial Expression: None,  normal Lips and Perioral Area: None, normal Jaw: None, normal Tongue: None, normal,Extremity Movements Upper (arms, wrists, hands, fingers): None, normal Lower (legs, knees, ankles, toes): None, normal, Trunk  Movements Neck, shoulders, hips: None, normal, Overall Severity Severity of abnormal movements (highest score from questions above): None, normal Incapacitation due to abnormal movements: None, normal Patient's awareness of abnormal movements (rate only patient's report): No Awareness, Dental Status Current problems with teeth and/or dentures?: No Does patient usually wear dentures?: No  CIWA:    COWS:     Musculoskeletal: Strength & Muscle Tone: within normal limits Gait & Station: normal Patient leans: N/A  Psychiatric Specialty Exam: Physical Exam  Nursing note and vitals reviewed. Cardiovascular: Normal rate.   Psychiatric: Judgment and thought content normal. Her mood appears anxious. She is slowed. Cognition and memory are normal. She exhibits a depressed mood.    Review of Systems  Constitutional: Positive for malaise/fatigue.  HENT: Negative.   Eyes: Negative.   Respiratory: Negative.   Cardiovascular: Negative.   Gastrointestinal: Negative.   Genitourinary: Negative.   Musculoskeletal: Negative.   Skin: Negative.   Neurological: Negative.   Endo/Heme/Allergies: Negative.   Psychiatric/Behavioral: Positive for depression. The patient is nervous/anxious and has insomnia.   All other systems reviewed and are negative.   Blood pressure 130/72, pulse 87, temperature 98.6 F (37 C), temperature source Oral, resp. rate 18, height 5' 10.5" (1.791 m), weight (!) 144.2 kg (318 lb).Body mass index is 44.98 kg/m.  General Appearance: Fairly Groomed  Eye Contact:  Minimal  Speech:  Normal Rate  Volume:  Normal  Mood:  Anxious and Depressed  Affect:  Congruent  Thought Process:  Coherent  Orientation:  Full (Time, Place, and Person)  Thought Content:  Delusions and  Rumination improving  Suicidal Thoughts:  No  Homicidal Thoughts:  No  Memory:  Immediate;   Fair Recent;   Fair Remote;   Fair  Judgement:  Fair  Insight:  Fair  Psychomotor Activity:  Normal  Concentration:  Concentration: Good and Attention Span: Good  Recall:  [employer] of Knowledge:  Fair  Language:  Fair  Akathisia:  Negative  Handed:  Right  AIMS (if indicated):     Assets:  Desire for Improvement Resilience  ADL's:  Intact  Cognition:  WNL  Sleep:  Number of Hours: 6.25     Treatment Plan Summary: Review of chart, vital signs, medications, and notes.  1-Individual and group therapy  2-Medication management for depression and anxiety: Medications reviewed with the patient and she stated no untoward effects, unchanged.   Depression:Will continue Lamictal 25 mg po qhs for mood sx. Insomnia:Will reduce Trazodone to 50 mg po qhs for sleep. 3-Coping skills for depression, anxiety  4-Continue crisis stabilization and management  5-Address health issues--monitoring vital signs. Hospitalist consult placed by Aggie NP regarding patient's TSH level and LE edema. Please see consult notes.Will continue increased dose of synthroid - 250 mcg daily. Started on Vitamin b12 replacement  - since it is low. 6-Treatment plan in progress to prevent relapse of depression and anxiety.   Abbe Bula 02/23/2016 3:28 PM

## 2016-02-23 NOTE — [facility] Group Notes (Signed)
Patient attend group. Her day was a 6. She feels better than yesterday. She's out bed, she feels her medicine are helping her in the correct direction. Now she needs a plan for discharge.

## 2016-02-23 NOTE — Progress Notes (Signed)
  D: pt was sitting on the bed prior to the assessment.  When asked pt stated, " I need something for gas and bloating". Stated, "the diarrhea is almost gone away but in it's place I'm having some bloating". Pt stated her last BM was this morning. Later in the assessment pt was asked if she is having thoughts of suicide, pt looked and stated, "umm, no". When asked if A/V pt answered more spontaneously. Pt has no questions or concerns.    A: Pt was given mylanta for gas.  Support and encouragement was offered. 15 min checks continued for safety.  R: Pt remains safe.

## 2016-02-23 NOTE — Tx Team (Signed)
Interdisciplinary Treatment Plan Update (Adult)  Date:  02/23/2016   Time Reviewed:  3:21 PM   Progress in Treatment: Attending groups: Yes. Participating in groups:  Yes. Taking medication as prescribed:  Yes. Tolerating medication:  Yes. Family/Significant other contact made:  No Patient understands diagnosis:  Yes  As evidenced by seeking help with "I need someone to help me with things like cooking." Discussing patient identified problems/goals with staff:  Yes, see initial care plan. Medical problems stabilized or resolved:  Yes. Denies suicidal/homicidal ideation: Yes. Issues/concerns per patient self-inventory:  No. Other:  New problem(s) identified:  Discharge Plan or Barriers: see below  Reason for Continuation of Hospitalization:   Comments: Meghan Hensley is an 45 y.o. single female who came to the [facility] voluntarily via EMS tonight with an altered mental status. Pt's unclear thinking, depression symptoms combined with her delusional explanations seem symptomatic of schizoaffective disorder which she has been previously diagnosed. Pt sts she has been "feeling down" ever since her father death last year for a terminal illness. Pt sts she is not taking care of herself very well and does not have the support she needs. 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  Prozac, Neurontin trial  Estimated length of stay:  Likley d/c tomorrow  New goal(s):  Review of initial/current patient goals per problem list:   Review of initial/current patient goals per problem list:  1. Goal(s): Patient will participate in aftercare plan   Met: Yes   Target date: 3-5 days post admission date   As evidenced by: Patient will participate within aftercare plan AEB aftercare provider and housing plan at discharge being  identified. 02/18/16:  "I need someone to help me every day.  I don't have anyone right now." 02/24/16:  States she will return home, follow up outpt   2. Goal (s): Patient will exhibit decreased depressive symptoms and suicidal ideations.   Met: Yes   Target date: 3-5 days post admission date   As evidenced by: Patient will utilize self rating of depression at 3 or below and demonstrate decreased signs of depression or be deemed stable for discharge by MD. 02/18/16:  Rates depression a 10 today; denies SI 02/23/16:  Rates depression a 2 today          Attendees: Patient:  02/23/2016 3:21 PM   Family:   02/23/2016 3:21 PM   Physician:  Ursula Alert, MD 02/23/2016 3:21 PM   Nursing:   Marilynne Halsted, RN 02/23/2016 3:21 PM   CSW:    Roque Lias, LCSW   02/23/2016 3:21 PM   Other:  02/23/2016 3:21 PM   Other:   02/23/2016 3:21 PM   Other:  Lars Pinks, Nurse CM 02/23/2016 3:21 PM   Other:   02/23/2016 3:21 PM   Other:  Norberto Sorenson, [place]  02/23/2016 3:21 PM   Other:  02/23/2016 3:21 PM   Other:  02/23/2016 3:21 PM   Other:  02/23/2016 3:21 PM   Other:  02/23/2016 3:21 PM   Other:  02/23/2016 3:21 PM   Other:   02/23/2016 3:21 PM    Scribe for Treatment Team:   Trish Mage, 02/23/2016 3:21 PM

## 2016-02-23 NOTE — [facility] Group Notes (Signed)
[facility] Group Therapy  02/23/2016 , 1:01 PM    Type of Therapy:  [facility] Presentation  Participation Level:  Active  Participation Quality:  Attentive  Affect:  Blunted  Cognitive:  Oriented  Insight:  Limited  Engagement in Therapy:  Engaged  Modes of Intervention:  Discussion, Education and Socialization  Summary of Progress/Problems:  Meghan Hensley from [facility] came to present his recovery story and play the guitar.   Stayed the entire time, engaged throughout.  Asked multiple questions of the presenter-all relevent.  [name] 02/23/2016 , 1:01 PM

## 2016-02-24 MED ORDER — LEVOTHYROXINE SODIUM 125 MCG PO TABS: 250.0000 ug | ORAL_TABLET | Freq: Every day | ORAL | 0 refills | Status: DC

## 2016-02-24 MED ORDER — PANTOPRAZOLE SODIUM 20 MG PO TBEC: 20.0000 mg | DELAYED_RELEASE_TABLET | Freq: Two times a day (BID) | ORAL | Status: DC

## 2016-02-24 MED ORDER — GABAPENTIN 100 MG PO CAPS: 200.0000 mg | ORAL_CAPSULE | Freq: Two times a day (BID) | ORAL | 0 refills | Status: DC

## 2016-02-24 MED ORDER — PANTOPRAZOLE SODIUM 40 MG PO TBEC
40.0000 mg | DELAYED_RELEASE_TABLET | Freq: Two times a day (BID) | ORAL | Status: DC
  Administered 2016-02-24 – 2016-02-25 (×2): 40 mg via ORAL
  Filled 2016-02-24 (×2): qty 1
  Filled 2016-02-24 (×2): qty 14
  Filled 2016-02-24 (×2): qty 1

## 2016-02-24 MED ORDER — PANTOPRAZOLE SODIUM 20 MG PO TBEC
20.0000 mg | DELAYED_RELEASE_TABLET | Freq: Two times a day (BID) | ORAL | Status: DC
  Filled 2016-02-24: qty 1

## 2016-02-24 MED ORDER — TRAZODONE HCL 50 MG PO TABS: 50.0000 mg | ORAL_TABLET | Freq: Every evening | ORAL | 0 refills | Status: DC | PRN

## 2016-02-24 MED ORDER — HYDROXYZINE HCL 25 MG PO TABS: 25.0000 mg | ORAL_TABLET | Freq: Four times a day (QID) | ORAL | 0 refills | Status: DC | PRN

## 2016-02-24 MED ORDER — PANTOPRAZOLE SODIUM 40 MG PO TBEC: 40.0000 mg | DELAYED_RELEASE_TABLET | Freq: Two times a day (BID) | ORAL | 0 refills | Status: DC

## 2016-02-24 MED ORDER — LAMOTRIGINE 25 MG PO TABS: 25.0000 mg | ORAL_TABLET | Freq: Every day | ORAL | 0 refills | Status: DC

## 2016-02-24 NOTE — Progress Notes (Signed)
Received an order for discharge and reviewed discharge information with pt and gave instructions. Prescriptions given to pt. Pt stated during discharge instructions that she had no where to go and had no car at [facility] as Clinical research associate was told previously. Per MD discontinue discharge until tomorrow. Pt was given multiple resources and encouraged to call. Pt later told Clinical research associate that she plans to go to [employer] and a shelter until her disability payment.

## 2016-02-24 NOTE — Clinical Social Work Note (Signed)
Pt presents as agitated, states "I just wrote how the medicine was working on those sheets y'll gave me", "if I discharge I will just go to a homeless shelter, I dont know where I can go."  "I am so confused about all this."  "I need a few days to take all this in."  States she has no stable place to stay, has no friends, voices confusion over aftercare arrangements.  Presents as worried, anxious, states that she "only came in here because of my thyroid and all the heat."  MD will be informed of patient presentation.  Santa Genera, LCSW Lead Clinical Social Worker Phone:  [phone]

## 2016-02-24 NOTE — Progress Notes (Signed)
Pt rated depression as a 2 and anxiety as a 1 this morning on 0-10 scale with 10 being the most. Pt reports having a red area at the top of her leg that was irritated with hospital underwear. NP to access. Pt verbally denied si and hi thoughts. She reported that her depression had decreased and feeling better this morning. Pt became anxious when MD reported discharge and pt reports that she wants to stay at hospital. Safety maintained

## 2016-02-24 NOTE — Progress Notes (Signed)
Patient attended karaoke group this evening.  

## 2016-02-24 NOTE — Progress Notes (Signed)
[facility] MD Progress Note  02/24/2016 3:41 PM Hydi Batch  MRN:  [id number]   Subjective:  "I need help with my gas and bloating before I can take my thyroid medication regularly.'      Objective:  Patient seen and chart reviewed.Discussed patient with treatment team.  Pt today seen in bed , initially denied any concerns, but when she was told about discharge - pt became anxious and restless. Per staff pt seen as going back and forth about her plan on discharge . Discussed with patient that writer will readjust her medications. Per CSW - pt needs to be referred to shelter or boarding house - CSW will work on that today.      Principal Problem: Schizoaffective disorder, bipolar type (HCC) Diagnosis:   Patient Active Problem List   Diagnosis Date Noted  . Hypothyroidism [E03.9] 02/21/2016  . Bilateral edema of lower extremity [R60.0] 02/21/2016  . Schizoaffective disorder, bipolar type (HCC) [F25.0] 02/17/2016   Total Time spent with patient: 20 minutes  Past Psychiatric History: see HPI   Past Medical History:  Past Medical History:  Diagnosis Date  . Bipolar 1 disorder (HCC)   . Schizophrenia (HCC)   . Thyroid disease    History reviewed. No pertinent surgical history. Family History: Please see H&P.  Family Psychiatric  History: see HPI Social History:  History  Alcohol Use No     History  Drug use: Unknown    Social History   Social History  . Marital status: Divorced    Spouse name: N/A  . Number of children: N/A  . Years of education: N/A   Social History Main Topics  . Smoking status: Never Smoker  . Smokeless tobacco: None  . Alcohol use No  . Drug use: Unknown  . Sexual activity: Not Asked   Other Topics Concern  . None   Social History Narrative  . None   Additional Social History:                         Sleep: Fair  Appetite:  Fair improving  Current Medications: Current Facility-Administered Medications  Medication  Dose Route Frequency Provider Last Rate Last Dose  . acetaminophen (TYLENOL) tablet 650 mg  650 mg Oral Q6H PRN Charm Rings, NP   650 mg at 02/21/16 1116  . alum & mag hydroxide-simeth (MAALOX/MYLANTA) 200-200-20 MG/5ML suspension 30 mL  30 mL Oral Q4H PRN Charm Rings, NP   30 mL at 02/24/16 0821  . gabapentin (NEURONTIN) capsule 200 mg  200 mg Oral BID Charm Rings, NP   200 mg at 02/24/16 0817  . hydrOXYzine (ATARAX/VISTARIL) tablet 25 mg  25 mg Oral Q6H PRN Charm Rings, NP   25 mg at 02/22/16 2118  . lamoTRIgine (LAMICTAL) tablet 25 mg  25 mg Oral QHS Jomarie Longs, MD   25 mg at 02/23/16 2218  . levothyroxine (SYNTHROID, LEVOTHROID) tablet 250 mcg  250 mcg Oral QAC breakfast Sanjuana Kava, NP   250 mcg at 02/24/16 0640  . magnesium hydroxide (MILK OF MAGNESIA) suspension 30 mL  30 mL Oral Daily PRN Charm Rings, NP      . pantoprazole (PROTONIX) EC tablet 40 mg  40 mg Oral BID AC Jabaree Mercado, MD      . traZODone (DESYREL) tablet 50 mg  50 mg Oral QHS PRN Jomarie Longs, MD   50 mg at 02/23/16 2219  . vitamin B-12 (CYANOCOBALAMIN)  tablet 100 mcg  100 mcg Oral Daily Jomarie Longs, MD   100 mcg at 02/24/16 1610    Lab Results:  No results found for this or any previous visit (from the past 48 hour(s)).  Blood Alcohol level:  Lab Results  Component Value Date   ETH <5 02/17/2016   ETH <5 09/21/2015    Metabolic Disorder Labs: No results found for: HGBA1C, MPG No results found for: PROLACTIN No results found for: CHOL, TRIG, HDL, CHOLHDL, VLDL, LDLCALC  Physical Findings: AIMS: Facial and Oral Movements Muscles of Facial Expression: None, normal Lips and Perioral Area: None, normal Jaw: None, normal Tongue: None, normal,Extremity Movements Upper (arms, wrists, hands, fingers): None, normal Lower (legs, knees, ankles, toes): None, normal, Trunk Movements Neck, shoulders, hips: None, normal, Overall Severity Severity of abnormal movements (highest score from  questions above): None, normal Incapacitation due to abnormal movements: None, normal Patient's awareness of abnormal movements (rate only patient's report): No Awareness, Dental Status Current problems with teeth and/or dentures?: No Does patient usually wear dentures?: No  CIWA:    COWS:     Musculoskeletal: Strength & Muscle Tone: within normal limits Gait & Station: normal Patient leans: N/A  Psychiatric Specialty Exam: Physical Exam  Nursing note and vitals reviewed. Cardiovascular: Normal rate.   Psychiatric: Judgment and thought content normal. Her mood appears anxious. She is slowed. Cognition and memory are normal. She exhibits a depressed mood.    Review of Systems  Constitutional: Positive for malaise/fatigue.  HENT: Negative.   Eyes: Negative.   Respiratory: Negative.   Cardiovascular: Negative.   Gastrointestinal: Negative.   Genitourinary: Negative.   Musculoskeletal: Negative.   Skin: Negative.   Neurological: Negative.   Endo/Heme/Allergies: Negative.   Psychiatric/Behavioral: Positive for depression. The patient is nervous/anxious and has insomnia.   All other systems reviewed and are negative.   Blood pressure 116/60, pulse 91, temperature 98.4 F (36.9 C), temperature source Oral, resp. rate 18, height 5' 10.5" (1.791 m), weight (!) 144.2 kg (318 lb).Body mass index is 44.98 kg/m.  General Appearance: Fairly Groomed  Eye Contact:  Fair  Speech:  Normal Rate  Volume:  Normal  Mood:  Anxious  Affect:  Congruent  Thought Process:  Coherent  Orientation:  Full (Time, Place, and Person)  Thought Content:  Rumination improving  Suicidal Thoughts:  No  Homicidal Thoughts:  No  Memory:  Immediate;   Fair Recent;   Fair Remote;   Fair  Judgement:  Fair  Insight:  Fair  Psychomotor Activity:  Normal  Concentration:  Concentration: Good and Attention Span: Good  Recall:  [employer] of Knowledge:  Fair  Language:  Fair  Akathisia:  Negative  Handed:   Right  AIMS (if indicated):     Assets:  Desire for Improvement Resilience  ADL's:  Intact  Cognition:  WNL  Sleep:  Number of Hours: 5.5     Treatment Plan Summary: Review of chart, vital signs, medications, and notes.  1-Individual and group therapy  2-Medication management for depression and anxiety: Medications reviewed with the patient and she stated no untoward effects, unchanged.   Depression:Will continue Lamictal 25 mg po qhs for mood sx. Insomnia:Will continue reduced Trazodone to 50 mg po qhs for sleep.Dose previously reduced due to day time grogginess. Will increase Protonix to 40 mg po bid for GI issues. 3-Coping skills for depression, anxiety  4-Continue crisis stabilization and management  5-Address health issues--monitoring vital signs. Hospitalist consult placed by Aggie NP  regarding patient's TSH level and LE edema. Please see consult notes.Will continue increased dose of synthroid - 250 mcg daily. Started on Vitamin b12 replacement  - since it is low. 6-Treatment plan in progress to prevent relapse of depression and anxiety. 7.CSW will work on disposition.   Lori Liew 02/24/2016 3:41 PM

## 2016-02-24 NOTE — Discharge Summary (Signed)
Physician Discharge Summary Note  Patient:  Meghan Hensley is an 45 y.o., female MRN:  [id number] DOB:  [birth year] Patient phone:  [phone] (home)  Patient address:   [address] [place] [address],  Total Time spent with patient: 30 minutes  Date of Admission:  02/17/2016 Date of Discharge: 02/24/2016  Reason for Admission:  Altered mental status  Principal Problem: Schizoaffective disorder, bipolar type [facility]) Discharge Diagnoses: Patient Active Problem List   Diagnosis Date Noted  . Hypothyroidism [E03.9] 02/21/2016  . Bilateral edema of lower extremity [R60.0] 02/21/2016  . Schizoaffective disorder, bipolar type (HCC) [F25.0] 02/17/2016    Past Psychiatric History:  See HPI  Past Medical History:  Past Medical History:  Diagnosis Date  . Bipolar 1 disorder (HCC)   . Schizophrenia (HCC)   . Thyroid disease    History reviewed. No pertinent surgical history. Family History: History reviewed. No pertinent family history. Family Psychiatric  History: see HPI Social History:  History  Alcohol Use No     History  Drug use: Unknown    Social History   Social History  . Marital status: Divorced    Spouse name: N/A  . Number of children: N/A  . Years of education: N/A   Social History Main Topics  . Smoking status: Never Smoker  . Smokeless tobacco: None  . Alcohol use No  . Drug use: Unknown  . Sexual activity: Not Asked   Other Topics Concern  . None   Social History Narrative  . None    Hospital Course:  Meghan Hensley, 45 y.o. single female who came initially to the [facility]  With altered mental status.  She admitted to [facility] with c/o of confusion.    Meghan Hensley was admitted for Schizoaffective disorder, bipolar type (HCC) and crisis management.  She was treated with lamoTRIgine (LAMICTAL) 25 MG tablet mood stabilization, gabapentin (NEURONTIN) 100 MG capsule for anxiety and traZODone (DESYREL) 50 MG tablet insomnia.  Medical problems  were identified and treated as needed.  Home medications were restarted as appropriate.  Emotional and mental status was monitored by daily self inventory reports completed by Meghan Hensley.  Patient's typical day consisted of going to groups and she would take naps in the early afternoon.  She did not c/o SI or HI.  She was however, a poor historian and had "dazed" look most times which was baseline for her.  Patient had been compliant on medications and denied side effects.  Support and encouragement was provided.    Patient encouraged to attend groups to help with recognizing triggers of emotional crises and de-stabilizations.  Patient encouraged to attend group to help identify the positive things in life that would help in dealing with feelings of loss, depression and unhealthy or abusive tendencies.         Meghan Hensley was evaluated by the treatment team for stability and plans for continued recovery upon discharge.  She was offered further treatment options upon discharge including Residential, Intensive Outpatient and Outpatient treatment. She will follow up with [facility] to follow up on her thyroid condition and other primary medicare care needs.  She will follow up at [facility]  for medication management and counseling.  Encouraged patient to maintain satisfactory support network and home environment.  Advised to adhere to medication compliance and outpatient treatment follow up.  Prescriptions provided.       Meghan Hensley motivation was an integral factor for scheduling further  treatment.  Employment, transportation, bed availability, health status, family support, and any pending legal issues were also considered during her hospital stay.  Upon completion of this admission the patient was both mentally and medically stable for discharge denying suicidal/homicidal ideation, auditory/visual/tactile hallucinations, delusional thoughts  and paranoia.      Physical Findings: AIMS: Facial and Oral Movements Muscles of Facial Expression: None, normal Lips and Perioral Area: None, normal Jaw: None, normal Tongue: None, normal,Extremity Movements Upper (arms, wrists, hands, fingers): None, normal Lower (legs, knees, ankles, toes): None, normal, Trunk Movements Neck, shoulders, hips: None, normal, Overall Severity Severity of abnormal movements (highest score from questions above): None, normal Incapacitation due to abnormal movements: None, normal Patient's awareness of abnormal movements (rate only patient's report): No Awareness, Dental Status Current problems with teeth and/or dentures?: No Does patient usually wear dentures?: No  CIWA:    COWS:     Musculoskeletal: Strength & Muscle Tone: within normal limits Gait & Station: normal Patient leans: N/A  Psychiatric Specialty Exam: Physical Exam  Nursing note and vitals reviewed. Psychiatric: She has a normal mood and affect. Her speech is normal and behavior is normal. Judgment and thought content normal. Cognition and memory are normal.    Review of Systems  Constitutional: Negative.   HENT: Negative.   Eyes: Negative.   Respiratory: Negative.   Cardiovascular: Negative.   Gastrointestinal:       C/O bloating  Genitourinary: Negative.   Musculoskeletal: Negative.   Skin: Negative.   Neurological: Negative.   Endo/Heme/Allergies: Negative.   Psychiatric/Behavioral: Negative.     Blood pressure 116/60, pulse 91, temperature 98.4 F (36.9 C), temperature source Oral, resp. rate 18, height 5' 10.5" (1.791 m), weight (!) 144.2 kg (318 lb).Body mass index is 44.98 kg/m.   Have you used any form of tobacco in the last 30 days? (Cigarettes, Smokeless Tobacco, Cigars, and/or Pipes): No  Has this patient used any form of tobacco in the last 30 days? (Cigarettes, Smokeless Tobacco, Cigars, and/or Pipes) Yes, No  Blood Alcohol level:  Lab Results  Component  Value Date   ETH <5 02/17/2016   ETH <5 09/21/2015    Metabolic Disorder Labs:  No results found for: HGBA1C, MPG No results found for: PROLACTIN No results found for: CHOL, TRIG, HDL, CHOLHDL, VLDL, LDLCALC  See Psychiatric Specialty Exam and Suicide Risk Assessment completed by Attending Physician prior to discharge.  Discharge destination:  Home  Is patient on multiple antipsychotic therapies at discharge:  No   Has Patient had three or more failed trials of antipsychotic monotherapy by history:  No  Recommended Plan for Multiple Antipsychotic Therapies: NA     Medication List    STOP taking these medications   ibuprofen 200 MG tablet Commonly known as:  ADVIL,MOTRIN     TAKE these medications     Indication  gabapentin 100 MG capsule Commonly known as:  NEURONTIN Take 2 capsules (200 mg total) by mouth 2 (two) times daily.  Indication:  Social Anxiety Disorder, Bipolar disorder   hydrOXYzine 25 MG tablet Commonly known as:  ATARAX/VISTARIL Take 1 tablet (25 mg total) by mouth every 6 (six) hours as needed for anxiety.  Indication:  Anxiety Neurosis   lamoTRIgine 25 MG tablet Commonly known as:  LAMICTAL Take 1 tablet (25 mg total) by mouth at bedtime.  Indication:  Depression   levothyroxine 125 MCG tablet Commonly known as:  SYNTHROID, LEVOTHROID Take 2 tablets (250 mcg total) by mouth daily before breakfast.  What changed:  medication strength  how much to take  when to take this  Indication:  Underactive Thyroid   pantoprazole 40 MG tablet Commonly known as:  PROTONIX Take 1 tablet (40 mg total) by mouth 2 (two) times daily before a meal.  Indication:  Gastroesophageal Reflux Disease   traZODone 50 MG tablet Commonly known as:  DESYREL Take 1 tablet (50 mg total) by mouth at bedtime as needed for sleep.  Indication:  Trouble Sleeping      Follow-up Information    [facility] .   Specialty:  Behavioral Health Why:  [facility]  on M-F between 8 and 10AM for your hospital follow up appointment if needed.   Contact information: [address] [place] [address] [phone]        [facility] Follow up on 03/07/2016.   Why:  Initial appointment for primary care on August 8 at 11:15 AM. Please bring hospital discharge paperwork to this appointment.  Call to cancel/reschedule if needed.  Please follow up on your thyroid level.  Started Protonix 40 update MD if symptom relief Contact information: [address] [place]  [address] Phone:  [phone] Fax:  [phone] - primary care fax Fax:  [phone] - mental health fax           Follow-up recommendations:  Activity:  as tol Diet:  as tol  Comments:  1.  Take all your medications as prescribed.   2.  Report any adverse side effects to outpatient provider. 3.  Patient instructed to not use alcohol or illegal drugs while on prescription medicines. 4.  In the event of worsening symptoms, instructed patient to call 911, the crisis hotline or go to nearest emergency room for evaluation of symptoms.  Signed: Lindwood Qua, NP [facility] 02/24/2016, 1:35 PM

## 2016-02-24 NOTE — BHH Suicide Risk Assessment (Addendum)
[facility] Discharge Suicide Risk Assessment   Principal Problem: Schizoaffective disorder, bipolar type [facility]) Discharge Diagnoses:  Patient Active Problem List   Diagnosis Date Noted  . Hypothyroidism [E03.9] 02/21/2016  . Bilateral edema of lower extremity [R60.0] 02/21/2016  . Schizoaffective disorder, bipolar type (HCC) [F25.0] 02/17/2016    Total Time spent with patient: 30 minutes  Musculoskeletal: Strength & Muscle Tone: within normal limits Gait & Station: normal Patient leans: N/A  Psychiatric Specialty Exam: Review of Systems  Psychiatric/Behavioral: Positive for substance abuse. Negative for depression and suicidal ideas.  All other systems reviewed and are negative.   Blood pressure 130/63, pulse 83, temperature 98.5 F (36.9 C), temperature source Oral, resp. rate 16, height 5' 10.5" (1.791 m), weight (!) 144.2 kg (318 lb).Body mass index is 44.98 kg/m.  General Appearance: Casual  Eye Contact::  Fair  Speech:  Clear and Coherent409  Volume:  Normal  Mood:  anxious about her situation  Affect:  Appropriate  Thought Process:  Goal Directed and Descriptions of Associations: Intact  Orientation:  Full (Time, Place, and Person)  Thought Content:  Logical  Suicidal Thoughts:  No  Homicidal Thoughts:  No  Memory:  Immediate;   Fair Recent;   Fair Remote;   Fair  Judgement:  Fair  Insight:  Fair  Psychomotor Activity:  Normal  Concentration:  Fair  Recall:  [employer] of Knowledge:Fair  Language: Fair  Akathisia:  No  Handed:  Right  AIMS (if indicated):     Assets:  Desire for Improvement  Sleep:  Number of Hours: 6.75  Cognition: WNL  ADL's:  Intact   Mental Status Per Nursing Assessment::   On Admission:  NA  Demographic Factors:  Caucasian  Loss Factors: NA  Historical Factors: Impulsivity  Risk Reduction Factors:   Positive therapeutic relationship  Continued Clinical Symptoms:  Depression: IMPROVING Previous Psychiatric Diagnoses and  Treatments Medical Diagnoses and Treatments/Surgeries  Cognitive Features That Contribute To Risk:  None    Suicide Risk:  Minimal: No identifiable suicidal ideation.  Patients presenting with no risk factors but with morbid ruminations; may be classified as minimal risk based on the severity of the depressive symptoms  Follow-up Information    [facility] .   Specialty:  Behavioral Health Why:  [facility] on M-F between 8 and 10AM for your hospital follow up appointment if needed.   Contact information: [address] [place] [address] [phone]        [facility] Follow up on 03/07/2016.   Why:  Initial appointment for primary care on August 8 at 11:15 AM. Please bring hospital discharge paperwork to this appointment.  Call to cancel/reschedule if needed.  Please follow up on your thyroid level.  Started Protonix 40 update MD if symptom relief Contact information: [address] [place]  [address] Phone:  [phone] Fax:  [phone] - primary care fax Fax:  [phone] - mental health fax           Plan Of Care/Follow-up recommendations:  Activity:  NO RESTRICTIONS Diet:  REGULAR Tests:  FOLLOW UP YOUR TSH WITH YOUR OUT PATIENT PROVIDER Other:  NONE  Eilish Mcdaniel, MD 02/25/2016, 11:15 AM

## 2016-02-24 NOTE — BHH Group Notes (Signed)
BHH LCSW Group Therapy  02/24/2016 6:12 PM  Type of Therapy:  Group Therapy  Participation Level:  Minimal  Participation Quality:  Minimal, mostly quiet but insightful when speaking  Affect:  Appropriate  Cognitive:  Appropriate  Insight:  Developing/Improving  Engagement in Therapy:  Developing/Improving  Modes of Intervention:  Discussion, Exploration, Socialization and Support  Summary of Progress/Problems:  Finding Balance in Life. Today's group focused on defining balance in one's own words, identifying things that can knock one off balance, and exploring healthy ways to maintain balance in life. Group members were asked to provide an example of a time when they felt off balance, describe how they handled that situation, and process healthier ways to regain balance in the future. Group members were asked to share the most important tool for maintaining balance that they learned while at [facility] and how they plan to apply this method after discharge.  Was able to challenge others in group in appropriate manner by pointing out importance of interpersonal boundaries and deciding whether to allow others to take up one's time and worry.  Group appreciated her comments and validated contribution.   Sallee Lange 02/24/2016, 6:12 PM

## 2016-02-24 NOTE — Progress Notes (Signed)
Patient ID: Meghan Hensley, female   DOB: [birth year], 45 y.o.   MRN: [id number] D: Client visible on the unit, seen in dayroom watching TV, reports anxiety "6" of 10, adjusting to medications. "I came in with heat stroke" "felt confused with environment so I wanted to come in" A: Writer provided emotional support, reviewed medications, administered as ordered. Staff will monitor q1min for safety. R: Client is safe on the unit, attended karaoke.

## 2016-02-24 NOTE — Progress Notes (Signed)
   D: When asked about her day, pt began to tell the writer about her episode of diarrhea from 2 days prior. Informed the writer that she may have hemorrhoids coming. Continues to be preoccupied with her bowels and after several minutes had to be redirected to discuss her thoughts and concerns. When asked about how she was feeling mentally, pt stated, "a little spacey, not my regular". Pt continues to be present in the milieu, but is still withdrawn and does not socialize with peers or staff.   A:  Support and encouragement was offered. 15 min checks continued for safety.  R: Pt remains safe.

## 2016-02-25 ENCOUNTER — Encounter (HOSPITAL_COMMUNITY): Payer: Self-pay

## 2016-02-25 ENCOUNTER — Emergency Department (HOSPITAL_COMMUNITY)
Admission: EM | Admit: 2016-02-25 | Discharge: 2016-02-25 | Disposition: A | Discharge status: Home / Self Care | Payer: Medicare Other | Attending: Emergency Medicine | Admitting: Emergency Medicine

## 2016-02-25 DIAGNOSIS — Z79899 Other long term (current) drug therapy: Secondary | ICD-10-CM | POA: Insufficient documentation

## 2016-02-25 DIAGNOSIS — F419 Anxiety disorder, unspecified: Secondary | ICD-10-CM | POA: Insufficient documentation

## 2016-02-25 DIAGNOSIS — E039 Hypothyroidism, unspecified: Secondary | ICD-10-CM | POA: Insufficient documentation

## 2016-02-25 DIAGNOSIS — F209 Schizophrenia, unspecified: Secondary | ICD-10-CM | POA: Insufficient documentation

## 2016-02-25 DIAGNOSIS — F319 Bipolar disorder, unspecified: Secondary | ICD-10-CM | POA: Insufficient documentation

## 2016-02-25 NOTE — ED Notes (Signed)
Per Theodoro Grist w/ TTS and Abigail PA, Pt will be discharge.  They will be contacting Social Work regarding homeless shelter placement and a taxi voucher.

## 2016-02-25 NOTE — Tx Team (Signed)
Interdisciplinary Treatment Plan Update (Adult)  Date:  02/25/2016   Time Reviewed:  9:36 AM   Progress in Treatment: Attending groups: Yes. Participating in groups:  Yes. Taking medication as prescribed:  Yes. Tolerating medication:  Yes. Family/Significant other contact made:  SPE completed with pt; pt declined to consent to family contact.  Patient understands diagnosis:  Yes  As evidenced by seeking help with "I need someone to help me with things like cooking." Discussing patient identified problems/goals with staff:  Yes, see initial care plan. Medical problems stabilized or resolved:  Yes. Denies suicidal/homicidal ideation: Yes. Issues/concerns per patient self-inventory:  No. Other:  Discharge Plan or Barriers: see below  Reason for Continuation of Hospitalization: none  Comments: Meghan Hensley is an 45 y.o. single female who [facility] to the [facility] voluntarily via EMS tonight with an altered mental status. Pt's unclear thinking, depression symptoms combined with her delusional explanations seem symptomatic of schizoaffective disorder which she has been previously diagnosed. Pt sts she has been "feeling down" ever since her father death last year for a terminal illness. Pt sts she is not taking care of herself very well and does not have the support she needs. 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  Prozac, Neurontin trial  Estimated length of stay:  [facility]ikley d/c tomorrow  New goal(s): to develop effective aftercare plan.   Review of initial/current patient goals per problem list:   Review of initial/current patient goals per problem list:  1. Goal(s): Patient will participate in aftercare plan   Met: Yes   Target date: 3-5 days post admission date   As evidenced by: Patient will participate within  aftercare plan AEB aftercare provider and housing plan at discharge being identified. 02/18/16:  "I need someone to help me every day.  I don't have anyone right now." 02/24/16:  States she will return home, follow up outpt   2. Goal (s): Patient will exhibit decreased depressive symptoms and suicidal ideations.   Met: Yes   Target date: 3-5 days post admission date   As evidenced by: Patient will utilize self rating of depression at 3 or below and demonstrate decreased signs of depression or be deemed stable for discharge by MD. 02/18/16:  Rates depression a 10 today; denies SI 02/23/16:  Rates depression a 2 today   Attendees: Patient:  02/25/2016 9:36 AM   Family:   02/25/2016 9:36 AM   Physician:  Saramma Eappen, MD 02/25/2016 9:36 AM   Nursing:   Elizabeth, Penny RN 02/25/2016 9:36 AM   CSW:     Smart, LCSW 02/25/2016 9:36 AM   Other:  02/25/2016 9:36 AM   Other:   02/25/2016 9:36 AM   Other:  May Augustin NP 02/25/2016 9:36 AM   Other:   02/25/2016 9:36 AM   Other:   02/25/2016 9:36 AM   Other:  02/25/2016 9:36 AM   Other:  02/25/2016 9:36 AM   Other:  02/25/2016 9:36 AM   Other:  02/25/2016 9:36 AM   Other:  02/25/2016 9:36 AM   Other:   02/25/2016 9:36 AM    Scribe for Treatment Team:    Smart, MSW, LCSW Clinical Social Worker 02/25/2016 9:42 AM     

## 2016-02-25 NOTE — Progress Notes (Signed)
  [facility] Adult Case Management Discharge Plan :  Will you be returning to the same living situation after discharge:  No. pt plans to d/c to shelter at d/c. She made arrangements.  At discharge, do you have transportation home?: Yes,  bus pass Do you have the ability to pay for your medications: Yes,  Medicare  Release of information consent forms completed and submitted to medical records by CSW. Patient to Follow up at: Follow-up Information    [facility] .   Specialty:  Behavioral Health Why:  [facility] to [facility] on M-F between 8 and 10AM for your hospital follow up appointment if needed.   Contact information: [address] [place] [address] [phone]        [facility] Follow up on 03/07/2016.   Why:  Initial appointment for primary care on August 8 at 11:15 AM. Please bring hospital discharge paperwork to this appointment.  Call to cancel/reschedule if needed.  Please follow up on your thyroid level.  Started Protonix 40 update MD if symptom relief Contact information: [address] [place]  [address] Phone:  [phone] Fax:  [phone] - primary care fax Fax:  [phone] - mental health fax           Next level of care provider has access to [facility] Link:no  Safety Planning and Suicide Prevention discussed: Yes,  SPE completed with pt; pt declined to consent to family contact. SPI pamphlet and Mobile crisis information provided to pt.   Have you used any form of tobacco in the last 30 days? (Cigarettes, Smokeless Tobacco, Cigars, and/or Pipes): No  Has patient been referred to the Quitline?: N/A patient is not a smoker  Patient has been referred for addiction treatment: Yes  Smart, Rainen Vanrossum LCSW 02/25/2016, 9:35 AM

## 2016-02-25 NOTE — Plan of Care (Signed)
Problem: Coping: Goal: Ability to identify and develop effective coping behavior will improve Outcome: Progressing Client is able to report decreased anxiety level "6" of 10 AEB reporting "doing well with anxiety and depression, I'm adjusting to the medication"

## 2016-02-25 NOTE — ED Provider Notes (Signed)
[facility]-EMERGENCY DEPT Provider Note   CSN: [id number] Arrival date & time: 02/25/16  1403  First Provider Contact:  None       History   Chief Complaint Chief Complaint  Patient presents with  . Anxiety    HPI Meghan Hensley is a 45 y.o. female who hx of bipolar and schizphrenia who was discharged from the [facility] today and had to be forcibly removed from the premesis by police .  She came back to the ed directly because she states" I am not stable yet. " Her logic is difficult to follow and she is a poor historian but she states: " I am a fragile person and I need some extra TLC. I could not dive or operate heavy machinery and you expect me to go out and get a job, but I have disability." She states that she does not actually drive a car. It is unclear whether she has an apartment because she seems to have some flight of ideas.   HPI     Past Medical History:  Diagnosis Date  . Bipolar 1 disorder (HCC)   . Schizophrenia (HCC)   . Thyroid disease     Patient Active Problem List   Diagnosis Date Noted  . Hypothyroidism 02/21/2016  . Bilateral edema of lower extremity 02/21/2016  . Schizoaffective disorder, bipolar type (HCC) 02/17/2016    History reviewed. No pertinent surgical history.  OB History    No data available       Home Medications    Prior to Admission medications   Medication Sig Start Date End Date Taking? Authorizing Provider  gabapentin (NEURONTIN) 100 MG capsule Take 2 capsules (200 mg total) by mouth 2 (two) times daily. 02/24/16   Adonis Brook, NP  hydrOXYzine (ATARAX/VISTARIL) 25 MG tablet Take 1 tablet (25 mg total) by mouth every 6 (six) hours as needed for anxiety. 02/24/16   Adonis Brook, NP  lamoTRIgine (LAMICTAL) 25 MG tablet Take 1 tablet (25 mg total) by mouth at bedtime. 02/24/16   Adonis Brook, NP  levothyroxine (SYNTHROID, LEVOTHROID) 125 MCG tablet Take 2 tablets (250 mcg total) by mouth daily before breakfast. 02/24/16   Adonis Brook, NP  pantoprazole (PROTONIX) 40 MG tablet Take 1 tablet (40 mg total) by mouth 2 (two) times daily before a meal. 02/24/16   Adonis Brook, NP  traZODone (DESYREL) 50 MG tablet Take 1 tablet (50 mg total) by mouth at bedtime as needed for sleep. 02/24/16   Adonis Brook, NP    Family History History reviewed. No pertinent family history.  Social History Social History  Substance Use Topics  . Smoking status: Never Smoker  . Smokeless tobacco: Never Used  . Alcohol use No     Allergies   Review of patient's allergies indicates no known allergies.   Review of Systems Review of Systems  Ten systems reviewed and are negative for acute change, except as noted in the HPI.   Physical Exam Updated Vital Signs BP 119/70 (BP Location: Left Arm)   Pulse 80   Temp 98.2 F (36.8 C) (Oral)   Resp 20   LMP 02/25/2016 (Exact Date)   SpO2 100%   Physical Exam  Constitutional: She is oriented to person, place, and time. She appears well-developed and well-nourished. No distress.  HENT:  Head: Normocephalic and atraumatic.  Eyes: Conjunctivae are normal. No scleral icterus.  Neck: Normal range of motion.  Cardiovascular: Normal rate, regular rhythm and normal heart sounds.  Exam reveals no  gallop and no friction rub.   No murmur heard. Pulmonary/Chest: Effort normal and breath sounds normal. No respiratory distress.  Abdominal: Soft. Bowel sounds are normal. She exhibits no distension and no mass. There is no tenderness. There is no guarding.  Neurological: She is alert and oriented to person, place, and time.  Skin: Skin is warm and dry. She is not diaphoretic.  Psychiatric: Her mood appears anxious. Her affect is labile. Her speech is rapid and/or pressured and tangential.     ED Treatments / Results  Labs (all labs ordered are listed, but only abnormal results are displayed) Labs Reviewed - No data to display  EKG  EKG Interpretation None       Radiology No  results found.  Procedures Procedures (including critical care time)  Medications Ordered in ED Medications - No data to display   Initial Impression / Assessment and Plan / ED Course  I have reviewed the triage vital signs and the nursing notes.  Pertinent labs & imaging results that were available during my care of the patient were reviewed by me and considered in my medical decision making (see chart for details).  Clinical Course  Comment By Time  There are no shelters available. Patient will be discharged anyway. Arthor Captain, PA-C 07/28 1840   She has been medically cleared with no new complaints and appears to    Final Clinical Impressions(s) / ED Diagnoses   Final diagnoses:  None    New Prescriptions New Prescriptions   No medications on file     Arthor Captain, PA-C 02/25/16 2146    Rolland Porter, MD 03/08/16 [phone]

## 2016-02-25 NOTE — ED Triage Notes (Addendum)
Pt by ambulance a week ago stating she was having heat stroke b/c she had been looking for gas for her car.  D/C from [facility] today.  Pt states she was kicked out over there and not treated.  Today, Pt states she is having nervous breakdown.  Very anxious and loud during assessment.  Pt states they did not give her resources.  Pt has to go to homeless shelter.  They didn't do therapy on her.  Pt delayed d/c at [facility] and admits they wanted her d/c a day after she arrived.  States she was given meds but having trouble keeping them down d/t nausea.  Pt states someone is playing joke on her with her d/c.

## 2016-02-25 NOTE — ED Notes (Signed)
Pt reported to [facility] she "wants to stay."  Sts "I was told that all I had to do was check in and you had to let me stay.  I'm homeless and need to be stabilized."

## 2016-02-25 NOTE — ED Notes (Addendum)
Per [facility], Pt has been cleared by psych and remains cleared.  Pt was given a bus pass to [facility] and escorted out of [facility] by [employer] earlier.  Sts "she is homeless and needs to go to [facility]."

## 2016-02-25 NOTE — ED Notes (Signed)
Pt ambulatory to restroom without assistance 

## 2016-02-25 NOTE — Progress Notes (Signed)
Patient denies SI/HI/AVH.  Patient does report that she, "Saw one of the patients and thought it was someone else". Patient became anxious when told that she was being discharged today. She locked her self in the bathroom, stating that she was, "nauseaus and need to go take a poop". She needed staff to ask her to come out multiple times. She then reported that,"You need to give me a ride. I don't know how to get to the [facility]". Patient refused to look through her bags from the locker. She stated, "I don't know if that is my bag. You could have switched it. Patient then stood at the nurses station and became augmentative about her bag, signing the paperwork and the nurse saying, OK". Patient had to be escorted out of that facility with security. Patient was walked to the street and showed the direction of the bus stop.  Belongings signed by patient and all belongings sent with her, including, discharge paperwork, her black backpack, sample medications for 7 days, scripts and a bus pass.  Pt. in no current distress and ambulatory.

## 2016-02-25 NOTE — Discharge Instructions (Signed)
Please follow up as directed by the behavioral health hospital.

## 2016-02-25 NOTE — Progress Notes (Signed)
CSW  Was notified by PA that pt is interested in shelter.  Per note, pt discharged form [facility] today and was suppose to go to a shelter.  However, the pt came to [facility].  Also, per note the pt was referred for addiction treatment and safety planning and suicide prevention was discussed.  CSW reached out to [facility] to inquire about bed. However, they are closed.  CSW reached out to [facility] and spoke with staff with attempts to get patient a bed. However, staff states that there are no beds available. CSW asked could pt sleep in their overflow/lobby. However, staff states pt cannot due to not having prior consent from their director.  CSW made PA aware.  Trish Mage [phone] ED CSW 02/25/2016 6:43 PM

## 2016-02-26 ENCOUNTER — Ambulatory Visit (HOSPITAL_COMMUNITY)
Admission: AD | Admit: 2016-02-26 | Discharge: 2016-02-26 | Disposition: A | Discharge status: Home / Self Care | Payer: Medicare Other | Attending: Psychiatry | Admitting: Psychiatry

## 2016-02-26 DIAGNOSIS — F319 Bipolar disorder, unspecified: Secondary | ICD-10-CM | POA: Insufficient documentation

## 2016-02-26 DIAGNOSIS — F259 Schizoaffective disorder, unspecified: Secondary | ICD-10-CM | POA: Insufficient documentation

## 2016-02-26 DIAGNOSIS — E079 Disorder of thyroid, unspecified: Secondary | ICD-10-CM | POA: Insufficient documentation

## 2016-02-26 DIAGNOSIS — F209 Schizophrenia, unspecified: Secondary | ICD-10-CM | POA: Insufficient documentation

## 2016-02-27 ENCOUNTER — Encounter (HOSPITAL_COMMUNITY): Payer: Self-pay

## 2016-02-27 ENCOUNTER — Emergency Department (HOSPITAL_COMMUNITY)
Admission: EM | Admit: 2016-02-27 | Discharge: 2016-02-27 | Disposition: A | Discharge status: Home / Self Care | Payer: Medicare Other | Attending: Emergency Medicine | Admitting: Emergency Medicine

## 2016-02-27 ENCOUNTER — Emergency Department (HOSPITAL_COMMUNITY)
Admission: EM | Admit: 2016-02-27 | Discharge: 2016-02-28 | Disposition: A | Discharge status: Home / Self Care | Payer: Medicare Other | Source: Home / Self Care | Attending: Emergency Medicine | Admitting: Emergency Medicine

## 2016-02-27 ENCOUNTER — Encounter (HOSPITAL_COMMUNITY): Payer: Self-pay | Admitting: Emergency Medicine

## 2016-02-27 DIAGNOSIS — Z01818 Encounter for other preprocedural examination: Secondary | ICD-10-CM | POA: Diagnosis present

## 2016-02-27 DIAGNOSIS — E039 Hypothyroidism, unspecified: Secondary | ICD-10-CM

## 2016-02-27 DIAGNOSIS — F319 Bipolar disorder, unspecified: Secondary | ICD-10-CM

## 2016-02-27 DIAGNOSIS — Z87891 Personal history of nicotine dependence: Secondary | ICD-10-CM | POA: Insufficient documentation

## 2016-02-27 DIAGNOSIS — F258 Other schizoaffective disorders: Secondary | ICD-10-CM

## 2016-02-27 DIAGNOSIS — Z79899 Other long term (current) drug therapy: Secondary | ICD-10-CM | POA: Insufficient documentation

## 2016-02-27 DIAGNOSIS — F259 Schizoaffective disorder, unspecified: Secondary | ICD-10-CM | POA: Insufficient documentation

## 2016-02-27 DIAGNOSIS — F600 Paranoid personality disorder: Secondary | ICD-10-CM | POA: Diagnosis not present

## 2016-02-27 DIAGNOSIS — F250 Schizoaffective disorder, bipolar type: Secondary | ICD-10-CM | POA: Diagnosis present

## 2016-02-27 DIAGNOSIS — F22 Delusional disorders: Secondary | ICD-10-CM | POA: Insufficient documentation

## 2016-02-27 LAB — ACETAMINOPHEN LEVEL: Acetaminophen (Tylenol), Serum: <10 ug/mL — ABNORMAL LOW (ref 10–30)

## 2016-02-27 LAB — COMPREHENSIVE METABOLIC PANEL
ALBUMIN: 3.9 g/dL (ref 3.5–5.0)
ALK PHOS: 83 U/L (ref 38–126)
ALT: 63 U/L — AB (ref 14–54)
AST: 41 U/L (ref 15–41)
Anion gap: 10 (ref 5–15)
BUN: 14 mg/dL (ref 6–20)
CALCIUM: 8.8 mg/dL — AB (ref 8.9–10.3)
CHLORIDE: 107 mmol/L (ref 101–111)
CO2: 21 mmol/L — AB (ref 22–32)
CREATININE: 1.16 mg/dL — AB (ref 0.44–1.00)
GFR calc Af Amer: >60 mL/min (ref >60)
GFR calc non Af Amer: 57 mL/min — ABNORMAL LOW (ref >60)
GLUCOSE: 104 mg/dL — AB (ref 65–99)
Potassium: 3.5 mmol/L (ref 3.5–5.1)
SODIUM: 138 mmol/L (ref 135–145)
Total Bilirubin: 0.5 mg/dL (ref 0.3–1.2)
Total Protein: 7.9 g/dL (ref 6.5–8.1)

## 2016-02-27 LAB — RAPID URINE DRUG SCREEN, HOSP PERFORMED
AMPHETAMINES: NOT DETECTED
Barbiturates: NOT DETECTED
Benzodiazepines: NOT DETECTED
Cocaine: NOT DETECTED
OPIATES: NOT DETECTED
Tetrahydrocannabinol: NOT DETECTED

## 2016-02-27 LAB — CBC
HCT: 34.7 % — ABNORMAL LOW (ref 36.0–46.0)
Hemoglobin: 11.4 g/dL — ABNORMAL LOW (ref 12.0–15.0)
MCH: 29.2 pg (ref 26.0–34.0)
MCHC: 32.9 g/dL (ref 30.0–36.0)
MCV: 88.7 fL (ref 78.0–100.0)
Platelets: 340 10*3/uL (ref 150–400)
RBC: 3.91 MIL/uL (ref 3.87–5.11)
RDW: 15.5 % (ref 11.5–15.5)
WBC: 10.9 10*3/uL — ABNORMAL HIGH (ref 4.0–10.5)

## 2016-02-27 LAB — ETHANOL: Alcohol, Ethyl (B): <5 mg/dL (ref <5)

## 2016-02-27 LAB — I-STAT BETA HCG BLOOD, ED (MC, WL, AP ONLY)

## 2016-02-27 LAB — SALICYLATE LEVEL

## 2016-02-27 MED ORDER — ZOLPIDEM TARTRATE 5 MG PO TABS: 5.0000 mg | ORAL_TABLET | Freq: Every evening | ORAL | Status: DC | PRN

## 2016-02-27 MED ORDER — PANTOPRAZOLE SODIUM 40 MG PO TBEC
40.0000 mg | DELAYED_RELEASE_TABLET | Freq: Two times a day (BID) | ORAL | Status: DC
  Administered 2016-02-27: 40 mg via ORAL
  Filled 2016-02-27: qty 1

## 2016-02-27 MED ORDER — ONDANSETRON HCL 4 MG PO TABS: 4.0000 mg | ORAL_TABLET | Freq: Three times a day (TID) | ORAL | Status: DC | PRN

## 2016-02-27 MED ORDER — ALUM & MAG HYDROXIDE-SIMETH 200-200-20 MG/5ML PO SUSP: 30.0000 mL | ORAL | Status: DC | PRN

## 2016-02-27 MED ORDER — HYDROXYZINE HCL 25 MG PO TABS: 25.0000 mg | ORAL_TABLET | Freq: Four times a day (QID) | ORAL | Status: DC | PRN

## 2016-02-27 MED ORDER — TRAZODONE HCL 50 MG PO TABS: 50.0000 mg | ORAL_TABLET | Freq: Every evening | ORAL | Status: DC | PRN

## 2016-02-27 MED ORDER — ACETAMINOPHEN 325 MG PO TABS: 650.0000 mg | ORAL_TABLET | ORAL | Status: DC | PRN

## 2016-02-27 MED ORDER — LORAZEPAM 1 MG PO TABS: 1.0000 mg | ORAL_TABLET | Freq: Three times a day (TID) | ORAL | Status: DC | PRN

## 2016-02-27 MED ORDER — LEVOTHYROXINE SODIUM 125 MCG PO TABS
250.0000 ug | ORAL_TABLET | Freq: Every day | ORAL | Status: DC
  Administered 2016-02-27: 250 ug via ORAL
  Filled 2016-02-27: qty 2

## 2016-02-27 MED ORDER — LAMOTRIGINE 25 MG PO TABS: 25.0000 mg | ORAL_TABLET | Freq: Every day | ORAL | Status: DC

## 2016-02-27 MED ORDER — IBUPROFEN 200 MG PO TABS
600.0000 mg | ORAL_TABLET | Freq: Three times a day (TID) | ORAL | Status: DC | PRN
  Administered 2016-02-27: 600 mg via ORAL
  Filled 2016-02-27: qty 3

## 2016-02-27 MED ORDER — GABAPENTIN 100 MG PO CAPS
200.0000 mg | ORAL_CAPSULE | Freq: Two times a day (BID) | ORAL | Status: DC
  Administered 2016-02-27: 200 mg via ORAL
  Filled 2016-02-27: qty 2

## 2016-02-27 NOTE — BHH Suicide Risk Assessment (Signed)
Suicide Risk Assessment  Discharge Assessment   [facility] Discharge Suicide Risk Assessment   Principal Problem: Schizoaffective disorder, bipolar type [facility]) Discharge Diagnoses:  Patient Active Problem List   Diagnosis Date Noted  . Schizoaffective disorder, bipolar type (HCC) [F25.0] 02/17/2016    Priority: High  . Hypothyroidism [E03.9] 02/21/2016  . Bilateral edema of lower extremity [R60.0] 02/21/2016    Total Time spent with patient: 45 minutes  Musculoskeletal: Strength & Muscle Tone: within normal limits Gait & Station: normal Patient leans: N/A  Psychiatric Specialty Exam: Physical Exam  Constitutional: She is oriented to person, place, and time. She appears well-developed and well-nourished.  HENT:  Head: Normocephalic.  Neck: Normal range of motion.  Respiratory: Effort normal.  Musculoskeletal: Normal range of motion.  Neurological: She is alert and oriented to person, place, and time.  Skin: Skin is warm and dry.  Psychiatric: Her speech is normal and behavior is normal. Judgment and thought content normal. Cognition and memory are normal. She exhibits a depressed mood.    Review of Systems  Constitutional: Negative.   HENT: Negative.   Eyes: Negative.   Respiratory: Negative.   Cardiovascular: Negative.   Gastrointestinal: Negative.   Genitourinary: Negative.   Musculoskeletal: Negative.   Skin: Negative.   Neurological: Negative.   Endo/Heme/Allergies: Negative.   Psychiatric/Behavioral: Positive for depression.    Blood pressure (!) 102/50, pulse 75, temperature 98.5 F (36.9 C), temperature source Oral, resp. rate 18, height 5' 10.5" (1.791 m), weight (!) 144.2 kg (318 lb), last menstrual period 02/25/2016, SpO2 95 %.Body mass index is 44.98 kg/m.  General Appearance: Casual  Eye Contact:  Good  Speech:  Normal Rate  Volume:  Normal  Mood:  Depressed, mild  Affect:  Congruent  Thought Process:  Coherent and Descriptions of Associations: Intact   Orientation:  Full (Time, Place, and Person)  Thought Content:  WDL  Suicidal Thoughts:  No  Homicidal Thoughts:  No  Memory:  Immediate;   Good Recent;   Good Remote;   Good  Judgement:  Fair  Insight:  Fair  Psychomotor Activity:  Normal  Concentration:  Concentration: Good and Attention Span: Good  Recall:  Good  Fund of Knowledge:  Good  Language:  Good  Akathisia:  No  Handed:  Right  AIMS (if indicated):     Assets:  Leisure Time Physical Health Resilience  ADL's:  Intact  Cognition:  WNL  Sleep:      Mental Status Per Nursing Assessment::   On Admission:   confusion about medications  Demographic Factors:  Caucasian  Loss Factors: NA  Historical Factors: NA  Risk Reduction Factors:   Sense of responsibility to family  Continued Clinical Symptoms:  Depression, mild  Cognitive Features That Contribute To Risk:  None    Suicide Risk:  Minimal: No identifiable suicidal ideation.  Patients presenting with no risk factors but with morbid ruminations; may be classified as minimal risk based on the severity of the depressive symptoms    Plan Of Care/Follow-up recommendations:  Activity:  as tolerated Diet:  heart healthy diet  Joe Tanney, NP 02/27/2016, 10:24 AM

## 2016-02-27 NOTE — ED Notes (Signed)
Up to the bathroom 

## 2016-02-27 NOTE — ED Triage Notes (Signed)
Pt states she is not feeling well  States she feels confused, has a poor appetite, and feels shaky in her head  Pt states she feels like she might hurt herself  Pt states she has been doing "extensive traveling" for the past month and while she was doing this she thinks it caused her to have a mild stroke and maybe even a miscarriage and a heat stroke  Pt states she is having a hard time expressing how she is feeling and "I know it all sounds very generic"  States she feels out of wack  Pt is c/o low back pain, knee pain, abd pain, and has been having chills  Pt states she has been bleeding from her lower extremities and has had water coming out of her skin  Pt states she has been having migraines  Pt also states she feels depressed and not a person of value and not cared for by others people and also thinks someone is trying to hurt her  Pt states something stung her in the bottom of her left foot several days ago and that has been giving her problems  Pt states she is in fear of hurting herself such as by falling or something

## 2016-02-27 NOTE — ED Notes (Signed)
Pt states that she questions the way she drives a car. Also states that she has a "vague feeling that people are trying to hurt her." Also wants "indirect support." States that she has been getting a lot of UV intake and might have had a mild stroke.

## 2016-02-27 NOTE — Consult Note (Signed)
[facility] Consult   Reason for Consult:  Disorganized per patient Referring Physician:  EDP Patient Identification: Meghan Hensley MRN:  [id number] Principal Diagnosis: Schizoaffective disorder, bipolar type [facility]) Diagnosis:   Patient Active Problem List   Diagnosis Date Noted  . Schizoaffective disorder, bipolar type ([place]) [F25.0] 02/17/2016    Priority: High  . Hypothyroidism [E03.9] 02/21/2016  . Bilateral edema of lower extremity [R60.0] 02/21/2016    Total Time spent with patient: 45 minutes  Subjective:   Meghan Hensley is a 45 y.o. female patient does not warrant admission.  HPI:  45 yo female who presented to the ED feeling confused at times about her medication protocol.  She claims she has not been eating but sitting at her bedside eating her breakfast without any issues noted.  Meghan Hensley reports she ran out of [place] money for this month and called shelters but "they didn't make room for me."  No suicidal/homicidal ideations, hallucinations, and alcohol/drug abuse.  Stable for discharge to shelters, has had inpatient recently along with several ED trips for homelessness.  Past Psychiatric History: schizoaffective disorder  Risk to Self: Is patient at risk for suicide?: Yes (states that over the last 24 hrs she has begun to feel suicidal ideation) Risk to Others:  none Prior Inpatient Therapy:  [facility] Prior Outpatient Therapy:  yes  Past Medical History:  Past Medical History:  Diagnosis Date  . Bipolar 1 disorder ([place])   . Schizophrenia ([place])   . Thyroid disease    History reviewed. No pertinent surgical history. Family History: History reviewed. No pertinent family history. Family Psychiatric  History: none Social History:  History  Alcohol Use No     History  Drug Use No    Social History   Social History  . Marital status: Divorced    Spouse name: N/A  . Number of children: N/A  . Years of education: N/A   Social History Main Topics  .  Smoking status: Former Research scientist (life sciences)  . Smokeless tobacco: Never Used  . Alcohol use No  . Drug use: No  . Sexual activity: Not Asked   Other Topics Concern  . None   Social History Narrative  . None   Additional Social History:    Allergies:  No Known Allergies  Labs:  Results for orders placed or performed during the hospital encounter of 02/27/16 (from the past 48 hour(s))  Rapid urine drug screen (hospital performed)     Status: None   Collection Time: 02/27/16 12:45 AM  Result Value Ref Range   Opiates NONE DETECTED NONE DETECTED   Cocaine NONE DETECTED NONE DETECTED   Benzodiazepines NONE DETECTED NONE DETECTED   Amphetamines NONE DETECTED NONE DETECTED   Tetrahydrocannabinol NONE DETECTED NONE DETECTED   Barbiturates NONE DETECTED NONE DETECTED    Comment:        DRUG SCREEN FOR MEDICAL PURPOSES ONLY.  IF CONFIRMATION IS NEEDED FOR ANY PURPOSE, NOTIFY LAB WITHIN 5 DAYS.        LOWEST DETECTABLE LIMITS FOR URINE DRUG SCREEN Drug Class       Cutoff (ng/mL) Amphetamine      1000 Barbiturate      200 Benzodiazepine   829 Tricyclics       937 Opiates          300 Cocaine          300 THC              50   Comprehensive metabolic panel  Status: Abnormal   Collection Time: 02/27/16 12:51 AM  Result Value Ref Range   Sodium 138 135 - 145 mmol/L   Potassium 3.5 3.5 - 5.1 mmol/L   Chloride 107 101 - 111 mmol/L   CO2 21 (L) 22 - 32 mmol/L   Glucose, Bld 104 (H) 65 - 99 mg/dL   BUN 14 6 - 20 mg/dL   Creatinine, Ser 1.16 (H) 0.44 - 1.00 mg/dL   Calcium 8.8 (L) 8.9 - 10.3 mg/dL   Total Protein 7.9 6.5 - 8.1 g/dL   Albumin 3.9 3.5 - 5.0 g/dL   AST 41 15 - 41 U/L   ALT 63 (H) 14 - 54 U/L   Alkaline Phosphatase 83 38 - 126 U/L   Total Bilirubin 0.5 0.3 - 1.2 mg/dL   GFR calc non Af Amer 57 (L) >60 mL/min   GFR calc Af Amer >60 >60 mL/min    Comment: (NOTE) The eGFR has been calculated using the CKD EPI equation. This calculation has not been validated in all  clinical situations. eGFR's persistently <60 mL/min signify possible Chronic Kidney Disease.    Anion gap 10 5 - 15  cbc     Status: Abnormal   Collection Time: 02/27/16 12:51 AM  Result Value Ref Range   WBC 10.9 (H) 4.0 - 10.5 K/uL   RBC 3.91 3.87 - 5.11 MIL/uL   Hemoglobin 11.4 (L) 12.0 - 15.0 g/dL   HCT 34.7 (L) 36.0 - 46.0 %   MCV 88.7 78.0 - 100.0 fL   MCH 29.2 26.0 - 34.0 pg   MCHC 32.9 30.0 - 36.0 g/dL   RDW 15.5 11.5 - 15.5 %   Platelets 340 150 - 400 K/uL  Ethanol     Status: None   Collection Time: 02/27/16 12:53 AM  Result Value Ref Range   Alcohol, Ethyl (B) <5 <5 mg/dL    Comment:        LOWEST DETECTABLE LIMIT FOR SERUM ALCOHOL IS 5 mg/dL FOR MEDICAL PURPOSES ONLY   Salicylate level     Status: None   Collection Time: 02/27/16 12:53 AM  Result Value Ref Range   Salicylate Lvl <1.6 2.8 - 30.0 mg/dL  Acetaminophen level     Status: Abnormal   Collection Time: 02/27/16 12:53 AM  Result Value Ref Range   Acetaminophen (Tylenol), Serum <10 (L) 10 - 30 ug/mL    Comment:        THERAPEUTIC CONCENTRATIONS VARY SIGNIFICANTLY. A RANGE OF 10-30 ug/mL MAY BE AN EFFECTIVE CONCENTRATION FOR MANY PATIENTS. HOWEVER, SOME ARE BEST TREATED AT CONCENTRATIONS OUTSIDE THIS RANGE. ACETAMINOPHEN CONCENTRATIONS >150 ug/mL AT 4 HOURS AFTER INGESTION AND >50 ug/mL AT 12 HOURS AFTER INGESTION ARE OFTEN ASSOCIATED WITH TOXIC REACTIONS.   I-Stat beta hCG blood, ED     Status: None   Collection Time: 02/27/16 12:58 AM  Result Value Ref Range   I-stat hCG, quantitative <5.0 <5 mIU/mL   Comment 3            Comment:   GEST. AGE      CONC.  (mIU/mL)   <=1 WEEK        5 - 50     2 WEEKS       50 - 500     3 WEEKS       100 - 10,000     4 WEEKS     1,000 - 30,000        FEMALE AND NON-PREGNANT FEMALE:  LESS THAN 5 mIU/mL     Current Facility-Administered Medications  Medication Dose Route Frequency Provider Last Rate Last Dose  . acetaminophen (TYLENOL) tablet 650 mg   650 mg Oral Q4H PRN Orpah Greek, MD      . alum & mag hydroxide-simeth (MAALOX/MYLANTA) 200-200-20 MG/5ML suspension 30 mL  30 mL Oral PRN Orpah Greek, MD      . gabapentin (NEURONTIN) capsule 200 mg  200 mg Oral BID Orpah Greek, MD   200 mg at 02/27/16 0943  . hydrOXYzine (ATARAX/VISTARIL) tablet 25 mg  25 mg Oral Q6H PRN Orpah Greek, MD      . ibuprofen (ADVIL,MOTRIN) tablet 600 mg  600 mg Oral Q8H PRN Orpah Greek, MD   600 mg at 02/27/16 0956  . lamoTRIgine (LAMICTAL) tablet 25 mg  25 mg Oral QHS Orpah Greek, MD      . levothyroxine (SYNTHROID, LEVOTHROID) tablet 250 mcg  250 mcg Oral QAC breakfast Orpah Greek, MD   250 mcg at 02/27/16 0743  . ondansetron (ZOFRAN) tablet 4 mg  4 mg Oral Q8H PRN Orpah Greek, MD      . pantoprazole (PROTONIX) EC tablet 40 mg  40 mg Oral BID AC Orpah Greek, MD   40 mg at 02/27/16 0742  . traZODone (DESYREL) tablet 50 mg  50 mg Oral QHS PRN Orpah Greek, MD       Current Outpatient Prescriptions  Medication Sig Dispense Refill  . gabapentin (NEURONTIN) 100 MG capsule Take 2 capsules (200 mg total) by mouth 2 (two) times daily. 120 capsule 0  . hydrOXYzine (ATARAX/VISTARIL) 25 MG tablet Take 1 tablet (25 mg total) by mouth every 6 (six) hours as needed for anxiety. 30 tablet 0  . lamoTRIgine (LAMICTAL) 25 MG tablet Take 1 tablet (25 mg total) by mouth at bedtime. 30 tablet 0  . levothyroxine (SYNTHROID, LEVOTHROID) 125 MCG tablet Take 2 tablets (250 mcg total) by mouth daily before breakfast. 60 tablet 0  . pantoprazole (PROTONIX) 40 MG tablet Take 1 tablet (40 mg total) by mouth 2 (two) times daily before a meal. 60 tablet 0  . traZODone (DESYREL) 50 MG tablet Take 1 tablet (50 mg total) by mouth at bedtime as needed for sleep. 30 tablet 0    Musculoskeletal: Strength & Muscle Tone: within normal limits Gait & Station: normal Patient leans: N/A  Psychiatric  Specialty Exam: Physical Exam  Constitutional: She is oriented to person, place, and time. She appears well-developed and well-nourished.  HENT:  Head: Normocephalic.  Neck: Normal range of motion.  Respiratory: Effort normal.  Musculoskeletal: Normal range of motion.  Neurological: She is alert and oriented to person, place, and time.  Skin: Skin is warm and dry.  Psychiatric: Her speech is normal and behavior is normal. Judgment and thought content normal. Cognition and memory are normal. She exhibits a depressed mood.    Review of Systems  Constitutional: Negative.   HENT: Negative.   Eyes: Negative.   Respiratory: Negative.   Cardiovascular: Negative.   Gastrointestinal: Negative.   Genitourinary: Negative.   Musculoskeletal: Negative.   Skin: Negative.   Neurological: Negative.   Endo/Heme/Allergies: Negative.   Psychiatric/Behavioral: Positive for depression.    Blood pressure (!) 102/50, pulse 75, temperature 98.5 F (36.9 C), temperature source Oral, resp. rate 18, height 5' 10.5" (1.791 m), weight (!) 144.2 kg (318 lb), last menstrual period 02/25/2016, SpO2 95 %.Body mass index is 44.98 kg/m.  General Appearance: Casual  Eye Contact:  Good  Speech:  Normal Rate  Volume:  Normal  Mood:  Depressed, mild  Affect:  Congruent  Thought Process:  Coherent and Descriptions of Associations: Intact  Orientation:  Full (Time, Place, and Person)  Thought Content:  WDL  Suicidal Thoughts:  No  Homicidal Thoughts:  No  Memory:  Immediate;   Good Recent;   Good Remote;   Good  Judgement:  Fair  Insight:  Fair  Psychomotor Activity:  Normal  Concentration:  Concentration: Good and Attention Span: Good  Recall:  Good  Fund of Knowledge:  Good  Language:  Good  Akathisia:  No  Handed:  Right  AIMS (if indicated):     Assets:  Leisure Time Physical Health Resilience  ADL's:  Intact  Cognition:  WNL  Sleep:        Treatment Plan Summary: Daily contact with patient  to assess and evaluate symptoms and progress in treatment, Medication management and Plan schizoaffective disorder, bipolar type:  -Crisis stabilization -Medication management:  Continue medical medications along with Gabapentin 200 mg BID for anxiety, Lamictal 25 mg at bedtime for mood, Trazodone 50 mg at bedtime for sleep, and Vistaril 25 mg every six hours PRN anxiety. -Individual counseling  Disposition: No evidence of imminent risk to self or others at present.    Waylan Boga, NP 02/27/2016 10:14 AM

## 2016-02-27 NOTE — ED Provider Notes (Signed)
[facility]-EMERGENCY DEPT Provider Note By signing my name below, I, Meghan Hensley, attest that this documentation has been prepared under the direction and in the presence of .  Electronically Signed: Arvilla Market, Medical Scribe. 02/27/16. 1:01 AM.  CSN: [id number] Arrival date & time: 02/27/16  0031  First Provider Contact:  None   History   Chief Complaint Chief Complaint  Patient presents with  . Medical Clearance    HPI Meghan Hensley is a 45 y.o. female.  The history is provided by the patient. No language interpreter was used.   HPI Comments: Meghan Hensley is a 45 y.o. female who presents to the Emergency Department for medical clearance. Pt reports irregular vaginal bleeding with odor onset tonight. Pt mentions she feels hopeless, and people want to hurt her "on a personal level". Pt reports audible and visual hallucinations.  Pt mentions she sees angles and doesn't know if that means she's going to die. Pt reports irregular and inconsistent appetite- some days are high appetite, and other days are low appetite. She states that she might have T2DM- pt has no PMHx of DM. Pt has had thyroid disease for 17 years now. Pt has been on disability for 4 years. Pt mentions alcohol was introduced to her as a kid since her parents are [place]- pt does little social drinking. Pt denies suicidal ideation. Pt denies EtOH abuse. Pt denies smoking.  Past Medical History:  Diagnosis Date  . Bipolar 1 disorder ([facility])   . Schizophrenia ([facility])   . Thyroid disease     Patient Active Problem List   Diagnosis Date Noted  . Hypothyroidism 02/21/2016  . Bilateral edema of lower extremity 02/21/2016  . Schizoaffective disorder, bipolar type ([facility]) 02/17/2016    History reviewed. No pertinent surgical history.  OB History    No data available       Home Medications    Prior to Admission medications   Medication Sig Start Date End Date Taking? Authorizing Provider  gabapentin  (NEURONTIN) 100 MG capsule Take 2 capsules (200 mg total) by mouth 2 (two) times daily. 02/24/16  Yes Adonis Brook, NP  hydrOXYzine (ATARAX/VISTARIL) 25 MG tablet Take 1 tablet (25 mg total) by mouth every 6 (six) hours as needed for anxiety. 02/24/16  Yes Adonis Brook, NP  lamoTRIgine (LAMICTAL) 25 MG tablet Take 1 tablet (25 mg total) by mouth at bedtime. 02/24/16  Yes Adonis Brook, NP  levothyroxine (SYNTHROID, LEVOTHROID) 125 MCG tablet Take 2 tablets (250 mcg total) by mouth daily before breakfast. 02/24/16  Yes Adonis Brook, NP  pantoprazole (PROTONIX) 40 MG tablet Take 1 tablet (40 mg total) by mouth 2 (two) times daily before a meal. 02/24/16  Yes Adonis Brook, NP  traZODone (DESYREL) 50 MG tablet Take 1 tablet (50 mg total) by mouth at bedtime as needed for sleep. 02/24/16  Yes Adonis Brook, NP    Family History History reviewed. No pertinent family history.  Social History Social History  Substance Use Topics  . Smoking status: Former Games developer  . Smokeless tobacco: Never Used  . Alcohol use No     Allergies   Review of patient's allergies indicates no known allergies.   Review of Systems Review of Systems  Genitourinary: Positive for vaginal bleeding.  Psychiatric/Behavioral: Positive for dysphoric mood and hallucinations. Negative for suicidal ideas.   Physical Exam Updated Vital Signs BP 105/67   Pulse 85   Temp 97.6 F (36.4 C) (Oral)   Resp 20   Ht 5' 10.5" (1.791  m)   Wt (!) 318 lb (144.2 kg)   LMP 02/25/2016 (Exact Date)   SpO2 92%   BMI 44.98 kg/m   Physical Exam  Constitutional: She is oriented to person, place, and time. She appears well-developed and well-nourished. No distress.  HENT:  Head: Normocephalic and atraumatic.  Right Ear: Hearing normal.  Left Ear: Hearing normal.  Nose: Nose normal.  Mouth/Throat: Oropharynx is clear and moist and mucous membranes are normal.  Eyes: Conjunctivae and EOM are normal. Pupils are equal, round, and  reactive to light.  Neck: Normal range of motion. Neck supple.  Cardiovascular: Regular rhythm, S1 normal and S2 normal.  Exam reveals no gallop and no friction rub.   No murmur heard. Pulmonary/Chest: Effort normal and breath sounds normal. No respiratory distress. She exhibits no tenderness.  Abdominal: Soft. Normal appearance and bowel sounds are normal. There is no hepatosplenomegaly. There is no tenderness. There is no rebound, no guarding, no tenderness at McBurney's point and negative Murphy's sign. No hernia.  Musculoskeletal: Normal range of motion.  Neurological: She is alert and oriented to person, place, and time. She has normal strength. No cranial nerve deficit or sensory deficit. Coordination normal. GCS eye subscore is 4. GCS verbal subscore is 5. GCS motor subscore is 6.  Skin: Skin is warm, dry and intact. No rash noted. No cyanosis.  Psychiatric: Her mood appears anxious. Her speech is tangential. She is slowed and withdrawn. Thought content is paranoid and delusional. She exhibits a depressed mood. She expresses no homicidal and no suicidal ideation.  Nursing note and vitals reviewed.  ED Treatments / Results  Labs (all labs ordered are listed, but only abnormal results are displayed) Labs Reviewed  COMPREHENSIVE METABOLIC PANEL  ETHANOL  SALICYLATE LEVEL  ACETAMINOPHEN LEVEL  CBC  URINE RAPID DRUG SCREEN, HOSP PERFORMED  I-STAT BETA HCG BLOOD, ED ([facility], [facility], [facility] ONLY)   DIAGNOSTIC STUDIES: Oxygen Saturation is 92% on RA, nl by my interpretation.    COORDINATION OF CARE: 1:38 AM Discussed treatment plan with pt at bedside and pt agreed to plan.   Radiology No results found.  Procedures Procedures (including critical care time)  Medications Ordered in ED Medications  zolpidem (AMBIEN) tablet 5 mg (not administered)  ibuprofen (ADVIL,MOTRIN) tablet 600 mg (not administered)  acetaminophen (TYLENOL) tablet 650 mg (not administered)  LORazepam (ATIVAN) tablet 1  mg (not administered)  ondansetron (ZOFRAN) tablet 4 mg (not administered)  alum & mag hydroxide-simeth (MAALOX/MYLANTA) 200-200-20 MG/5ML suspension 30 mL (not administered)  gabapentin (NEURONTIN) capsule 200 mg (not administered)  hydrOXYzine (ATARAX/VISTARIL) tablet 25 mg (not administered)  lamoTRIgine (LAMICTAL) tablet 25 mg (not administered)  levothyroxine (SYNTHROID, LEVOTHROID) tablet 250 mcg (not administered)  pantoprazole (PROTONIX) EC tablet 40 mg (not administered)  traZODone (DESYREL) tablet 50 mg (not administered)   Initial Impression / Assessment and Plan / ED Course  I have reviewed the triage vital signs and the nursing notes.  Pertinent labs & imaging results that were available during my care of the patient were reviewed by me and considered in my medical decision making (see chart for details).  Clinical Course   Patient referred to the emergency department from behavioral health. She has been seen multiple times with odd behavior. Patient is clearly delusional and paranoid, but not obviously a danger to herself. She is not homicidal or suicidal actively. Medical screening labs have been obtained, patient will be held until morning for psychiatric evaluation.  Final Clinical Impressions(s) / ED Diagnoses  Final diagnoses:  None  Paranoia Delusions  New Prescriptions New Prescriptions   No medications on file    I personally performed the services described in this documentation, which was scribed in my presence. The recorded information has been reviewed and is accurate.    Gilda Crease, MD 02/27/16 [id number]

## 2016-02-27 NOTE — ED Notes (Addendum)
Written dc instructions reviewed with patient.  Pt encouraged to take her medications as directed and follow up wit her OP provider, shelter information/homeless information given to pt by TTS, reviewed with pt.  Bus pass given.  Pt ambulatory to dc area with [facility] and [employer] officer, belongings returned after leaving the area.

## 2016-02-27 NOTE — ED Notes (Signed)
Patient noted sleeping in room. No complaints, stable, in no acute distress. Q15 minute rounds and monitoring via Security Cameras to continue.  

## 2016-02-27 NOTE — ED Notes (Signed)
CSW to talk with pt about housing/shelter options

## 2016-02-27 NOTE — ED Triage Notes (Signed)
Pt BIB tech from [facility]. Pt states that she doesn't have a way to describe what is going on. Feels like her anxiety is causing AH. Endorses difficulty remembering things. States she may be coming down with diabetes and night need to be insulin dependent. "I don't feel well, but I am hopeful." A&Ox4.

## 2016-02-27 NOTE — ED Notes (Signed)
Pt. Transferred to [facility] from ED to room 34. Report from [employer]. Pt. Oriented to unit including Q15 minute rounds as well as the security cameras for their protection. Patient is alert and oriented, warm and dry in no acute distress. Patient denies HI. Pt. States she has some SI without plan and hears voices without command as well as seeing "Angels". Pt. Encouraged to let me know if needs arise.

## 2016-02-27 NOTE — BH Assessment (Signed)
Tele Assessment Note   Meghan Hensley is an 45 y.o. single female who presents unaccompanied to [facility] reporting symptoms of anxiety, worthlessness, auditory hallucinations and disorganized thinking. Pt was discharged from [facility] 02/24/16 and then presented to [facility] the next day reporting similar symptoms. Pt states "I'm not as cognizant as I could be" and says "I don't think I am about to die but I think I'm close to passing. I don't want to be overly optimistic." Pt states she is unable to take her synthroid and psychiatric medications because "I can't understand how to take the medications." Pt feels there is something wrong with her and "I feel destabilized."Pt first denies she is experiencing auditory hallucinations then says she hears voices. She denies current suicidal ideation but says in the past she has "fantasies" of driving her car into a lake and drowning. Pt denies homicidal ideation or history of violence. Pt denies alcohol or substance use.   Pt sts she has been "feeling down" ever since her father death last year for a terminal illness. Pt sts she is not taking care of herself very well and does not have the support she needs. Pt repeatedly states she has been traveling too much for too long and that she is having continuing heat stroke. She reports trouble taking care of herself and handling her business affairs. 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 Pt sts she completed a BA degree. Pt sts she had one marriage which was annulled after a short period of time and has no children. Pt sts she eats a lot of "junk food" and no "real meals." Pt denied any hx of physical, verbal/emotional or sexual abuse. Pt denied any legal hx, past or present.    Pt is dressed unclean and torn clothing. She is alert, oriented x4 with normal speech and normal motor behavior. Eye contact is fair and Pt looks to the ceiling while talking. Pt's mood is sad and  anxious and affect is congruent with mood. Thought process is tangential and circumstantial. Pt was cooperative throughout assessment.   Diagnosis: Schizoaffective Disorder  Past Medical History:  Past Medical History:  Diagnosis Date  . Bipolar 1 disorder (HCC)   . Schizophrenia (HCC)   . Thyroid disease     No past surgical history on file.  Family History: No family history on file.  Social History:  reports that she has quit smoking. She has never used smokeless tobacco. She reports that she does not drink alcohol or use drugs.  Additional Social History:  Alcohol / Drug Use Pain Medications: See PTA Prescriptions: see MAR Over the Counter: See PTA History of alcohol / drug use?: Yes Longest period of sobriety (when/how long): unknown Substance #1 Name of Substance 1: Alcohol 1 - Age of First Use: as a child- Table wine at dinner with parents who are from [place] 1 - Amount (size/oz): "a glass or two" 1 - Frequency: 2-3 times per month 1 - Duration: ongoing 1 - Last Use / Amount: a few weeks ago  CIWA:   COWS:    PATIENT STRENGTHS: (choose at least two) Ability for insight Average or above average intelligence Capable of independent living Communication skills General fund of knowledge  Allergies: No Known Allergies  Home Medications:  (Not in a hospital admission)  OB/GYN Status:  Patient's last menstrual period was 02/25/2016 (exact date).  General Assessment Data Location of Assessment: [facility] Assessment Services TTS Assessment:  In system Is this a Tele or Face-to-Face Assessment?: Face-to-Face Is this an Initial Assessment or a Re-assessment for this encounter?: Initial Assessment Marital status: Single Maiden name: NA Is patient pregnant?: No Pregnancy Status: No Living Arrangements: Alone Can pt return to current living arrangement?: Yes Admission Status: Voluntary Is patient capable of signing voluntary admission?: Yes Referral Source:  Self/Family/Friend Insurance type: Medicare  Medical Screening Exam [facility] Walk-in ONLY) Medical Exam completed: No Reason for MSE not completed: Other: (Pt transferred to [facility] for medical clearance)  Crisis Care Plan Living Arrangements: Alone Legal Guardian: Other: (Self) Name of Psychiatrist: None Name of Therapist: None  Education Status Is patient currently in school?: No Current Grade: NA Highest grade of school patient has completed: BA degree Name of school: NA Contact person: NA  Risk to self with the past 6 months Suicidal Ideation: No Has patient been a risk to self within the past 6 months prior to admission? : No Suicidal Intent: No Has patient had any suicidal intent within the past 6 months prior to admission? : No Is patient at risk for suicide?: No Suicidal Plan?: No Has patient had any suicidal plan within the past 6 months prior to admission? : No Access to Means: No What has been your use of drugs/alcohol within the last 12 months?: Pt denies Previous Attempts/Gestures: Yes How many times?: 2 Other Self Harm Risks: None identified Triggers for Past Attempts: Unpredictable Intentional Self Injurious Behavior: None Family Suicide History: No Recent stressful life event(s): Loss (Comment), Other (Comment) (Poor support, father died one year ago) Persecutory voices/beliefs?: No Depression: Yes Depression Symptoms: Despondent, Tearfulness, Fatigue, Loss of interest in usual pleasures, Feeling worthless/self pity Substance abuse history and/or treatment for substance abuse?: No Suicide prevention information given to non-admitted patients: Not applicable  Risk to Others within the past 6 months Homicidal Ideation: No Does patient have any lifetime risk of violence toward others beyond the six months prior to admission? : No Thoughts of Harm to Others: No Current Homicidal Intent: No Current Homicidal Plan: No Access to Homicidal Means: No Identified  Victim: None History of harm to others?: No Assessment of Violence: None Noted Violent Behavior Description: Pt denies history of violence Does patient have access to weapons?: No Criminal Charges Pending?: No Does patient have a court date: No Is patient on probation?: No  Psychosis Hallucinations: Auditory (Pt reports hearing voices) Delusions: None noted  Mental Status Report Appearance/Hygiene: Disheveled, Poor hygiene Eye Contact: Fair Motor Activity: Unremarkable Speech: Tangential Level of Consciousness: Alert Mood: Anxious, Depressed Affect: Anxious Anxiety Level: Moderate Thought Processes: Circumstantial, Tangential Judgement: Partial Orientation: Person, Place, Time, Situation, Appropriate for developmental age Obsessive Compulsive Thoughts/Behaviors: None  Cognitive Functioning Concentration: Decreased Memory: Recent Intact, Remote Intact IQ: Average Insight: Poor Impulse Control: Fair Appetite: Fair Weight Loss: 0 Weight Gain: 0 Sleep: Decreased Total Hours of Sleep: 5 Vegetative Symptoms: Decreased grooming  ADLScreening [facility] Assessment Services) Patient's cognitive ability adequate to safely complete daily activities?: Yes Patient able to express need for assistance with ADLs?: Yes Independently performs ADLs?: Yes (appropriate for developmental age)  Prior Inpatient Therapy Prior Inpatient Therapy: Yes Prior Therapy Dates: 02/25/16 Prior Therapy Facilty/Provider(s): [facility] Reason for Treatment: Schizoaffective disorder  Prior Outpatient Therapy Prior Outpatient Therapy: Yes Prior Therapy Dates: unknown Prior Therapy Facilty/Provider(s): unknown Reason for Treatment: Schzioaffective disorder Does patient have an ACCT team?: No Does patient have Intensive In-House Services?  : No Does patient have [facility] services? : No Does patient have P4CC  services?: No  ADL Screening (condition at time of admission) Patient's cognitive ability adequate  to safely complete daily activities?: Yes Is the patient deaf or have difficulty hearing?: No Does the patient have difficulty seeing, even when wearing glasses/contacts?: No Does the patient have difficulty concentrating, remembering, or making decisions?: No Patient able to express need for assistance with ADLs?: Yes Does the patient have difficulty dressing or bathing?: No Independently performs ADLs?: Yes (appropriate for developmental age) Does the patient have difficulty walking or climbing stairs?: No Weakness of Legs: None Weakness of Arms/Hands: None       Abuse/Neglect Assessment (Assessment to be complete while patient is alone) Physical Abuse: Denies Verbal Abuse: Denies Sexual Abuse: Denies Exploitation of patient/patient's resources: Denies Self-Neglect: Denies     Merchant navy officer (For Healthcare) Does patient have an advance directive?: No Would patient like information on creating an advanced directive?: No - patient declined information    Additional Information 1:1 In Past 12 Months?: No CIRT Risk: No Elopement Risk: No Does patient have medical clearance?: No     Disposition: Gave clinical report to Maryjean Morn, PA who recommended Pt be transferred to ED for medical clearance and evaluation by psychiatrist in the morning. Pt agrees to transfer. Contacted Kerri, Consulting civil engineer at [facility], and gave report. Pt transported to [facility] via [employer] and [facility] staff.  Disposition Initial Assessment Completed for this Encounter: Yes Disposition of Patient: Other dispositions Other disposition(s): Other (Comment)   Pamalee Leyden, [facility], [facility], [facility] Triage Specialist [phone]   Patsy Baltimore, Harlin Rain 02/27/2016 12:43 AM

## 2016-02-27 NOTE — ED Notes (Signed)
tts into see 

## 2016-02-28 ENCOUNTER — Encounter (HOSPITAL_COMMUNITY): Payer: Self-pay | Admitting: Emergency Medicine

## 2016-02-28 ENCOUNTER — Encounter: Payer: Self-pay | Admitting: *Deleted

## 2016-02-28 ENCOUNTER — Inpatient Hospital Stay (HOSPITAL_COMMUNITY)
Admission: RE | Admit: 2016-02-28 | Discharge: 2016-03-06 | DRG: 885 | Disposition: A | Discharge status: Home / Self Care | Payer: Medicare Other | Attending: Psychiatry | Admitting: Psychiatry

## 2016-02-28 ENCOUNTER — Emergency Department (HOSPITAL_COMMUNITY): Payer: Medicare Other

## 2016-02-28 ENCOUNTER — Emergency Department (HOSPITAL_COMMUNITY)
Admission: EM | Admit: 2016-02-28 | Discharge: 2016-02-28 | Disposition: A | Discharge status: Home / Self Care | Payer: Medicare Other | Source: Home / Self Care | Attending: Emergency Medicine | Admitting: Emergency Medicine

## 2016-02-28 DIAGNOSIS — F419 Anxiety disorder, unspecified: Secondary | ICD-10-CM

## 2016-02-28 DIAGNOSIS — Z590 Homelessness: Secondary | ICD-10-CM

## 2016-02-28 DIAGNOSIS — Z79899 Other long term (current) drug therapy: Secondary | ICD-10-CM

## 2016-02-28 DIAGNOSIS — R45851 Suicidal ideations: Secondary | ICD-10-CM | POA: Diagnosis present

## 2016-02-28 DIAGNOSIS — R202 Paresthesia of skin: Secondary | ICD-10-CM | POA: Insufficient documentation

## 2016-02-28 DIAGNOSIS — F250 Schizoaffective disorder, bipolar type: Secondary | ICD-10-CM | POA: Diagnosis present

## 2016-02-28 DIAGNOSIS — E039 Hypothyroidism, unspecified: Secondary | ICD-10-CM | POA: Insufficient documentation

## 2016-02-28 DIAGNOSIS — F600 Paranoid personality disorder: Secondary | ICD-10-CM | POA: Diagnosis not present

## 2016-02-28 DIAGNOSIS — Z9114 Patient's other noncompliance with medication regimen: Secondary | ICD-10-CM | POA: Diagnosis not present

## 2016-02-28 DIAGNOSIS — R600 Localized edema: Secondary | ICD-10-CM | POA: Diagnosis present

## 2016-02-28 DIAGNOSIS — Z87891 Personal history of nicotine dependence: Secondary | ICD-10-CM | POA: Insufficient documentation

## 2016-02-28 DIAGNOSIS — F314 Bipolar disorder, current episode depressed, severe, without psychotic features: Secondary | ICD-10-CM | POA: Diagnosis present

## 2016-02-28 DIAGNOSIS — G2571 Drug induced akathisia: Secondary | ICD-10-CM | POA: Clinically undetermined

## 2016-02-28 LAB — BASIC METABOLIC PANEL
ANION GAP: 7 (ref 5–15)
BUN: 11 mg/dL (ref 6–20)
CALCIUM: 9 mg/dL (ref 8.9–10.3)
CO2: 27 mmol/L (ref 22–32)
CREATININE: 1.11 mg/dL — AB (ref 0.44–1.00)
Chloride: 106 mmol/L (ref 101–111)
GFR calc Af Amer: >60 mL/min (ref >60)
GFR, EST NON AFRICAN AMERICAN: 60 mL/min — AB (ref >60)
GLUCOSE: 95 mg/dL (ref 65–99)
Potassium: 3.8 mmol/L (ref 3.5–5.1)
Sodium: 140 mmol/L (ref 135–145)

## 2016-02-28 LAB — CBC WITH DIFFERENTIAL/PLATELET
BASOS ABS: 0 10*3/uL (ref 0.0–0.1)
Basophils Relative: 0 %
EOS PCT: 2 %
Eosinophils Absolute: 0.2 10*3/uL (ref 0.0–0.7)
HEMATOCRIT: 34.4 % — AB (ref 36.0–46.0)
Hemoglobin: 11.1 g/dL — ABNORMAL LOW (ref 12.0–15.0)
LYMPHS PCT: 19 %
Lymphs Abs: 1.8 10*3/uL (ref 0.7–4.0)
MCH: 28.8 pg (ref 26.0–34.0)
MCHC: 32.3 g/dL (ref 30.0–36.0)
MCV: 89.1 fL (ref 78.0–100.0)
MONO ABS: 0.3 10*3/uL (ref 0.1–1.0)
MONOS PCT: 4 %
Neutro Abs: 7.1 10*3/uL (ref 1.7–7.7)
Neutrophils Relative %: 75 %
PLATELETS: 280 10*3/uL (ref 150–400)
RBC: 3.86 MIL/uL — ABNORMAL LOW (ref 3.87–5.11)
RDW: 15.6 % — AB (ref 11.5–15.5)
WBC: 9.3 10*3/uL (ref 4.0–10.5)

## 2016-02-28 NOTE — ED Notes (Signed)
Bed: [facility] Expected date:  Expected time:  Means of arrival:  Comments: 

## 2016-02-28 NOTE — Discharge Instructions (Signed)
Please follow up with your psychiatrist/counselor, [facility] health, [place]

## 2016-02-28 NOTE — BH Assessment (Signed)
Tele Assessment Note   Meghan Hensley is an 45 y.o. female.  -Patient is a walk-in at [facility] and is unaccompanied.  Patient said that she had come to [facility] earlier in the day but was sent to [facility] when she started complaining of tingling in her hands and fingers.  She was medically cleared there.  Patient came back to [facility] to have behavioral health assessment.  Patient is tangential in her thinking.  She is largely unfocused and has to be redirected in conversation.  She does say that she has thoughts of killing herself.  When asked how she would do it she says "I could drown myself."  Patient denies any HI.  She says she does hear voices of angels.  Has been seeing "flashes of light" that could be angels.    Patient denies any SA issues.  She talks a lot about her father having died a year ago and how that is affecting her.  Patient used the term "estranged" to describe relationship with mother.    When asked about recent hospitalizations patient says nothing about having been discharged from [facility] a couple of days ago.  She mentions hospitalizations in [facility] in [place] years ago.  -Clinician discussed patient care with Donell Sievert, PA who accepted patient back to [facility].  Tori, [facility] said patient can go to [facility] 505-1, to Dr. Elna Breslow.  Patient being accepted to [facility] as a direct admit.    Diagnosis: Schizophrenia, Bipolar 1 d/o  Past Medical History:  Past Medical History:  Diagnosis Date  . Bipolar 1 disorder (HCC)   . Schizophrenia (HCC)   . Thyroid disease     No past surgical history on file.  Family History: No family history on file.  Social History:  reports that she has quit smoking. She has never used smokeless tobacco. She reports that she does not drink alcohol or use drugs.  Additional Social History:  Alcohol / Drug Use Pain Medications: See PTA medication list Prescriptions: See PTA medication list.  Pt says she takes her Synthroid pretty regularly.  Could not  name her psychiatric meds. Over the Counter: See PTA medication list History of alcohol / drug use?: No history of alcohol / drug abuse  CIWA:   COWS:    PATIENT STRENGTHS: (choose at least two) Average or above average intelligence Capable of independent living  Allergies: No Known Allergies  Home Medications:  Medications Prior to Admission  Medication Sig Dispense Refill  . gabapentin (NEURONTIN) 100 MG capsule Take 2 capsules (200 mg total) by mouth 2 (two) times daily. 120 capsule 0  . hydrOXYzine (ATARAX/VISTARIL) 25 MG tablet Take 1 tablet (25 mg total) by mouth every 6 (six) hours as needed for anxiety. 30 tablet 0  . lamoTRIgine (LAMICTAL) 25 MG tablet Take 1 tablet (25 mg total) by mouth at bedtime. 30 tablet 0  . levothyroxine (SYNTHROID, LEVOTHROID) 125 MCG tablet Take 2 tablets (250 mcg total) by mouth daily before breakfast. 60 tablet 0  . pantoprazole (PROTONIX) 40 MG tablet Take 1 tablet (40 mg total) by mouth 2 (two) times daily before a meal. 60 tablet 0  . traZODone (DESYREL) 50 MG tablet Take 1 tablet (50 mg total) by mouth at bedtime as needed for sleep. 30 tablet 0    OB/GYN Status:  Patient's last menstrual period was 02/25/2016 (exact date).  General Assessment Data Location of Assessment: [facility] Assessment Services TTS Assessment: In system Is this a Tele or Face-to-Face Assessment?: Face-to-Face Is  this an Initial Assessment or a Re-assessment for this encounter?: Initial Assessment Marital status: Single Is patient pregnant?: No Pregnancy Status: No Living Arrangements: Alone (Pt says she is in process of "moving out"  Probably homeless) Can pt return to current living arrangement?: Yes Admission Status: Voluntary Is patient capable of signing voluntary admission?: Yes Referral Source: Self/Family/Friend Insurance type: MCR  Medical Screening Exam [facility] Walk-in ONLY) Medical Exam completed: No Reason for MSE not completed: Other: (Pt accepted as  direct admit.  Was at [facility] earlier today.)  Crisis Care Plan Living Arrangements: Alone (Pt says she is in process of "moving out"  Probably homeless) Name of Psychiatrist: None Name of Therapist: None  Education Status Is patient currently in school?: No Highest grade of school patient has completed: BA degree Name of school: NA Contact person: NA  Risk to self with the past 6 months Suicidal Ideation: Yes-Currently Present Has patient been a risk to self within the past 6 months prior to admission? : No Suicidal Intent: Yes-Currently Present Has patient had any suicidal intent within the past 6 months prior to admission? : Yes Is patient at risk for suicide?: Yes Suicidal Plan?: Yes-Currently Present Has patient had any suicidal plan within the past 6 months prior to admission? : No Specify Current Suicidal Plan: Drowning herself Access to Means: Yes Specify Access to Suicidal Means[place]. What has been your use of drugs/alcohol within the last 12 months?: Pt denies Previous Attempts/Gestures: Yes How many times?: 2 Other Self Harm Risks: None Triggers for Past Attempts: Unpredictable Intentional Self Injurious Behavior: None Family Suicide History: No Recent stressful life event(s): Loss (Comment), Financial Problems (Father died a year ago; suspected homelessness) Persecutory voices/beliefs?: Yes Depression: Yes Depression Symptoms: Despondent, Insomnia, Isolating, Guilt, Loss of interest in usual pleasures, Feeling worthless/self pity Substance abuse history and/or treatment for substance abuse?: No Suicide prevention information given to non-admitted patients: Not applicable  Risk to Others within the past 6 months Homicidal Ideation: No Does patient have any lifetime risk of violence toward others beyond the six months prior to admission? : No Thoughts of Harm to Others: No Current Homicidal Intent: No Current Homicidal Plan: No Access to Homicidal  Means: No Identified Victim: No one History of harm to others?: No Assessment of Violence: None Noted Violent Behavior Description: Pt denies Does patient have access to weapons?: No Criminal Charges Pending?: No Does patient have a court date: No Is patient on probation?: No  Psychosis Hallucinations: Auditory, Visual (Hears angels; sees flashes of light that are angels) Delusions: None noted  Mental Status Report Appearance/Hygiene: Disheveled, Poor hygiene Eye Contact: Fair Motor Activity: Unremarkable, Freedom of movement Speech: Tangential Level of Consciousness: Alert Mood: Depressed, Anxious, Despair, Helpless, Sad Affect: Anxious, Sad, Depressed Anxiety Level: Moderate Thought Processes: Irrelevant, Circumstantial Judgement: Unimpaired Orientation: Person, Place, Time, Situation, Appropriate for developmental age Obsessive Compulsive Thoughts/Behaviors: None  Cognitive Functioning Concentration: Decreased Memory: Remote Intact, Recent Impaired IQ: Average Insight: Poor Impulse Control: Fair Appetite: Poor Weight Loss:  (Could not quantify) Weight Gain: 0 Sleep: Decreased Total Hours of Sleep: 5 Vegetative Symptoms: Decreased grooming  ADLScreening [facility] Assessment Services) Patient's cognitive ability adequate to safely complete daily activities?: Yes Patient able to express need for assistance with ADLs?: Yes Independently performs ADLs?: Yes (appropriate for developmental age)  Prior Inpatient Therapy Prior Inpatient Therapy: Yes Prior Therapy Dates: 02/25/16 Prior Therapy Facilty/Provider(s): [facility] Reason for Treatment: Schizoaffective disorder  Prior Outpatient Therapy Prior Outpatient Therapy: Yes  Prior Therapy Dates: unknown Prior Therapy Facilty/Provider(s): unknown Reason for Treatment: Schzioaffective disorder Does patient have an ACCT team?: No Does patient have Intensive In-House Services?  : No Does patient have [facility] services? :  No Does patient have P4CC services?: No  ADL Screening (condition at time of admission) Patient's cognitive ability adequate to safely complete daily activities?: Yes Is the patient deaf or have difficulty hearing?: No Does the patient have difficulty seeing, even when wearing glasses/contacts?: No Does the patient have difficulty concentrating, remembering, or making decisions?: Yes Patient able to express need for assistance with ADLs?: Yes Does the patient have difficulty dressing or bathing?: No Independently performs ADLs?: Yes (appropriate for developmental age) Does the patient have difficulty walking or climbing stairs?: No Weakness of Legs: Both (Knee pain that comes and goes.) Weakness of Arms/Hands: None  Home Assistive Devices/Equipment Home Assistive Devices/Equipment: None    Abuse/Neglect Assessment (Assessment to be complete while patient is alone) Physical Abuse: Denies Verbal Abuse: Yes, past (Comment) (Bullied in grade school.) Sexual Abuse: Denies Exploitation of patient/patient's resources: Denies Self-Neglect: Denies     Merchant navy officer (For Healthcare) Does patient have an advance directive?: No Would patient like information on creating an advanced directive?: No - patient declined information    Additional Information 1:1 In Past 12 Months?: No CIRT Risk: No Elopement Risk: No Does patient have medical clearance?: No     Disposition:  Disposition Initial Assessment Completed for this Encounter: Yes Disposition of Patient: Inpatient treatment program, Referred to Type of inpatient treatment program: Adult Other disposition(s): Other (Comment) (Pt accepted to [facility] 505-1)  Beatriz Stallion Ray 02/28/2016 11:45 PM

## 2016-02-28 NOTE — ED Provider Notes (Signed)
[facility]-EM[facility]Y DEPT Provider Note   CSN: [id number] Arrival date & time: 02/28/16  1405  First Provider Contact:  First MD Initiated Contact with Patient 02/28/16 1450        History   Chief Complaint Chief Complaint  Patient presents with  . Anxiety    HPI Meghan Hensley is a 45 y.o. female.  HPI Meghan Hensley is a 45 y.o. female with PMH significant for Bipolar disorder, schizophrenia, and thyroid disease who has been seen by [facility] multiple times with the most recent being yesterday where she was discharged.  She presents today stating she thinks she had a stroke because she has flown in an airplane in the last month.  She states she has a hard time gathering her thoughts and will have intermittent numbness/tingling to bilateral upper extremities.  Denies head injury/trauma.  No unilateral leg swelling, SOB, or CP.  She is not SI, HI, but she does state that she sees angels.    Past Medical History:  Diagnosis Date  . Bipolar 1 disorder (HCC)   . Schizophrenia (HCC)   . Thyroid disease     Patient Active Problem List   Diagnosis Date Noted  . Hypothyroidism 02/21/2016  . Bilateral edema of lower extremity 02/21/2016  . Schizoaffective disorder, bipolar type (HCC) 02/17/2016    History reviewed. No pertinent surgical history.  OB History    No data available       Home Medications    Prior to Admission medications   Medication Sig Start Date End Date Taking? Authorizing Provider  gabapentin (NEURONTIN) 100 MG capsule Take 2 capsules (200 mg total) by mouth 2 (two) times daily. 02/24/16   Adonis Brook, NP  hydrOXYzine (ATARAX/VISTARIL) 25 MG tablet Take 1 tablet (25 mg total) by mouth every 6 (six) hours as needed for anxiety. 02/24/16   Adonis Brook, NP  lamoTRIgine (LAMICTAL) 25 MG tablet Take 1 tablet (25 mg total) by mouth at bedtime. 02/24/16   Adonis Brook, NP  levothyroxine (SYNTHROID, LEVOTHROID) 125 MCG tablet Take 2 tablets (250 mcg total) by  mouth daily before breakfast. 02/24/16   Adonis Brook, NP  pantoprazole (PROTONIX) 40 MG tablet Take 1 tablet (40 mg total) by mouth 2 (two) times daily before a meal. 02/24/16   Adonis Brook, NP  traZODone (DESYREL) 50 MG tablet Take 1 tablet (50 mg total) by mouth at bedtime as needed for sleep. 02/24/16   Adonis Brook, NP    Family History No family history on file.  Social History Social History  Substance Use Topics  . Smoking status: Former Games developer  . Smokeless tobacco: Never Used  . Alcohol use No     Allergies   Review of patient's allergies indicates no known allergies.   Review of Systems Review of Systems All other systems negative unless otherwise stated in HPI   Physical Exam Updated Vital Signs BP 131/87   Pulse 78   Temp 97.9 F (36.6 C)   Resp 18   LMP 02/25/2016 (Exact Date)   SpO2 95% Comment: Simultaneous filing. User may not have seen previous data.  Physical Exam  Constitutional: She is oriented to person, place, and time. She appears well-developed and well-nourished.  Non-toxic appearance. She does not have a sickly appearance. She does not appear ill.  HENT:  Head: Normocephalic and atraumatic.  Mouth/Throat: Oropharynx is clear and moist.  Eyes: Conjunctivae are normal. Pupils are equal, round, and reactive to light.  Neck: Normal range of motion. Neck supple.  Cardiovascular: Normal rate and regular rhythm.   Pulmonary/Chest: Effort normal and breath sounds normal. No accessory muscle usage or stridor. No respiratory distress. She has no wheezes. She has no rhonchi. She has no rales.  Abdominal: Soft. Bowel sounds are normal. She exhibits no distension. There is no tenderness.  Musculoskeletal: Normal range of motion.  Lymphadenopathy:    She has no cervical adenopathy.  Neurological: She is alert and oriented to person, place, and time.  Speech clear without dysarthria. Cranial nerves grossly intact.  No facial droop.  No pronator drift.   Normal finger to nose bilaterally.  5/5 strength in all extremities.  Normal sensation.   Skin: Skin is warm and dry.  Psychiatric: She is slowed. She exhibits a depressed mood. She expresses no homicidal and no suicidal ideation. She expresses no suicidal plans and no homicidal plans.     ED Treatments / Results  Labs (all labs ordered are listed, but only abnormal results are displayed) Labs Reviewed  CBC WITH DIFFERENTIAL/PLATELET - Abnormal; Notable for the following:       Result Value   RBC 3.86 (*)    Hemoglobin 11.1 (*)    HCT 34.4 (*)    RDW 15.6 (*)    All other components within normal limits  BASIC METABOLIC PANEL - Abnormal; Notable for the following:    Creatinine, Ser 1.11 (*)    GFR calc non Af Amer 60 (*)    All other components within normal limits    EKG  EKG Interpretation None       Radiology Ct Head Wo Contrast  Result Date: 02/28/2016 CLINICAL DATA:  Numbness in extremities. History of schizophrenia and bipolar. EXAM: CT HEAD WITHOUT CONTRAST TECHNIQUE: Contiguous axial images were obtained from the base of the skull through the vertex without intravenous contrast. COMPARISON:  None. FINDINGS: Brain: Ventricles are normal in size and configuration. All areas of the brain demonstrate normal gray-white matter attenuation. There is no mass, hemorrhage, edema or other evidence of acute parenchymal abnormality. No extra-axial hemorrhage. Vascular: No hyperdense vessel or unexpected calcification. Skull: Negative for fracture or focal lesion. Sinuses/Orbits: No acute findings. Other: None. IMPRESSION: Negative head CT.  No intracranial mass, hemorrhage or edema. Electronically Signed   By: Bary Richard M.D.   On: 02/28/2016 17:29    Procedures Procedures (including critical care time)  Medications Ordered in ED Medications - No data to display   Initial Impression / Assessment and Plan / ED Course  I have reviewed the triage vital signs and the nursing  notes.  Pertinent labs & imaging results that were available during my care of the patient were reviewed by me and considered in my medical decision making (see chart for details).  Clinical Course   Patient presents stating she thinks she has had a stroke from riding in an airplane because of stroke like symptoms over the last couple of days (numbness/tingling in BUE, memory problems, and slurred speech).  Of note, she has been seen here 4 times since 02/16/16 for psych complaints.  She states she has been seeing angels as well (this is unchanged, and has already been evaluated by [facility]).  She is not SI or HI.  She has a normal neurological exam.  Lab work yesterday was unremarkable.  Low suspicion for acute intracranial process.  Suspect related to psychiatric issues. Discussed low supicion for acute intracranial process given normal neurological exam with the patient; however, she states "i'm on antidepressants, and that affects  my mind, so I think that we should do that because we need documentation".  Discussed case with Dr. Estell Harpin.  Will obtain CBC, BMP, and head CT for further evaluation.  Lab work and head CT normal.  Patient has psych follow up with [facility].  She is not SI or HI.  No need for TTS consult at this time.  Follow up PCP and [facility].  Discussed these findings and plan with the patient.  She agrees and acknowledges the above plan for discharge.   Final Clinical Impressions(s) / ED Diagnoses   Final diagnoses:  Paresthesias  Anxiety    New Prescriptions New Prescriptions   No medications on file     Gwinda Maine 02/28/16 1836    Bethann Berkshire, MD 02/28/16 2304

## 2016-02-28 NOTE — ED Triage Notes (Signed)
Per EMS pt request evaluation for numbness in extremities related to anxiety. Pt denies SI/HI but reports VH of angels. Discharged this morning and escorted off premises.

## 2016-02-28 NOTE — Progress Notes (Unsigned)
Pt seen in [facility] ED lobby (Cm walked in area, noted [employer] speaking with pt attempting to redirect her to community services)  Cm assessed pt needs and provided a list of medicare providers near [place] (pt's states preference) to f/u with Pt recently d/c per TTS staff and pt not wanting to be d/c but was medically and [facility] cleared for d/c with request to f/u with pcp Pt stating she is not wanting to return to old pcp so resources provided

## 2016-02-28 NOTE — ED Provider Notes (Signed)
[facility] DEPT Provider Note   CSN: [id number] Arrival date & time: 02/27/16  2029  First Provider Contact:  None       History   Chief Complaint Chief Complaint  Patient presents with  . Suicidal    HPI Meghan Hensley is a 45 y.o. female.  This a 45 year old female who presents to the emergency department after being [facility] from [facility] with a history of schizoaffective disorder and bipolar stating that none of her concerns were addressed.  She was at the hospital.  He is concerned include calluses on her feet.  The fact that she thinks she may have had a stroke since she flew in an airplane within the last month.  The fact that she doesn't think she could take a time has because of the stroke.  She states that she has a higher than average because she has a Therapist, art, and she knows her body and her mind. She also states that she's had heat stroke and she knows that she had a miscarriage although she denies being pregnant.  She also states that she's been on disability for the past 4 years due to her thyroid disease. She states she is not suicidal, homicidal.  She just wants the above-mentioned issues addressed.      Past Medical History:  Diagnosis Date  . Bipolar 1 disorder (HCC)   . Schizophrenia (HCC)   . Thyroid disease     Patient Active Problem List   Diagnosis Date Noted  . Hypothyroidism 02/21/2016  . Bilateral edema of lower extremity 02/21/2016  . Schizoaffective disorder, bipolar type (HCC) 02/17/2016    History reviewed. No pertinent surgical history.  OB History    No data available       Home Medications    Prior to Admission medications   Medication Sig Start Date End Date Taking? Authorizing Provider  gabapentin (NEURONTIN) 100 MG capsule Take 2 capsules (200 mg total) by mouth 2 (two) times daily. 02/24/16  Yes Adonis Brook, NP  hydrOXYzine (ATARAX/VISTARIL) 25 MG tablet Take 1 tablet (25 mg total) by mouth  every 6 (six) hours as needed for anxiety. 02/24/16  Yes Adonis Brook, NP  lamoTRIgine (LAMICTAL) 25 MG tablet Take 1 tablet (25 mg total) by mouth at bedtime. 02/24/16  Yes Adonis Brook, NP  levothyroxine (SYNTHROID, LEVOTHROID) 125 MCG tablet Take 2 tablets (250 mcg total) by mouth daily before breakfast. 02/24/16  Yes Adonis Brook, NP  pantoprazole (PROTONIX) 40 MG tablet Take 1 tablet (40 mg total) by mouth 2 (two) times daily before a meal. 02/24/16  Yes Adonis Brook, NP  traZODone (DESYREL) 50 MG tablet Take 1 tablet (50 mg total) by mouth at bedtime as needed for sleep. 02/24/16  Yes Adonis Brook, NP    Family History History reviewed. No pertinent family history.  Social History Social History  Substance Use Topics  . Smoking status: Former Games developer  . Smokeless tobacco: Never Used  . Alcohol use No     Allergies   Review of patient's allergies indicates no known allergies.   Review of Systems Review of Systems  Skin: Negative for wound.       Calluses on her feet  Neurological: Negative for speech difficulty, weakness and headaches.  Psychiatric/Behavioral: Negative for suicidal ideas.  All other systems reviewed and are negative.    Physical Exam Updated Vital Signs LMP 02/25/2016 (Exact Date)   Physical Exam  Constitutional: She appears well-developed and well-nourished. No distress.  Patient is disheveled in appearance, with dirty hair.  She is wearing a skirt with a large hole over the abdomen.  Flip-flops that are ill fitting.  Dirty fingernails  Eyes: Pupils are equal, round, and reactive to light.  Neck: Normal range of motion.  Cardiovascular: Normal rate.   Pulmonary/Chest: Effort normal and breath sounds normal.  Abdominal: Bowel sounds are normal.  Musculoskeletal: Normal range of motion.  Skin: Skin is warm and dry. She is not diaphoretic.  She does have thick calluses on the medial aspect of bilateral heels without any surrounding erythema or  appearance of infection  Psychiatric: Her behavior is normal. Her affect is inappropriate. Her speech is rapid and/or pressured. Thought content is delusional. Cognition and memory are normal. She expresses inappropriate judgment. She expresses no homicidal and no suicidal ideation. She expresses no suicidal plans and no homicidal plans.  Nursing note and vitals reviewed.    ED Treatments / Results  Labs (all labs ordered are listed, but only abnormal results are displayed) Labs Reviewed - No data to display  EKG  EKG Interpretation None       Radiology No results found.  Procedures Procedures (including critical care time)  Medications Ordered in ED Medications - No data to display   Initial Impression / Assessment and Plan / ED Course  I have reviewed the triage vital signs and the nursing notes.  Pertinent labs & imaging results that were available during my care of the patient were reviewed by me and considered in my medical decision making (see chart for details).  Clinical Course   again, denies suicidality or homicidality    Final Clinical Impressions(s) / ED Diagnoses   Final diagnoses:  Bipolar 1 disorder (HCC)  Schizo-affective schizophrenia, chronic condition [facility])    New Prescriptions New Prescriptions   No medications on file     Earley Favor, NP 02/28/16 0446    Earley Favor, NP 02/28/16 0454    Earley Favor, NP 02/28/16 1610    Paula Libra, MD 02/28/16 [phone]

## 2016-02-29 ENCOUNTER — Encounter (HOSPITAL_COMMUNITY): Payer: Self-pay | Admitting: Nurse Practitioner

## 2016-02-29 DIAGNOSIS — F314 Bipolar disorder, current episode depressed, severe, without psychotic features: Secondary | ICD-10-CM | POA: Diagnosis present

## 2016-02-29 DIAGNOSIS — F250 Schizoaffective disorder, bipolar type: Principal | ICD-10-CM

## 2016-02-29 DIAGNOSIS — R45851 Suicidal ideations: Secondary | ICD-10-CM

## 2016-02-29 MED ORDER — LORAZEPAM 1 MG PO TABS
1.0000 mg | ORAL_TABLET | ORAL | Status: DC | PRN
  Administered 2016-02-29 – 2016-03-02 (×3): 1 mg via ORAL
  Filled 2016-02-29 (×3): qty 1

## 2016-02-29 MED ORDER — LORAZEPAM 2 MG/ML IJ SOLN: 1.0000 mg | INTRAMUSCULAR | Status: DC | PRN

## 2016-02-29 MED ORDER — ARIPIPRAZOLE 5 MG PO TABS
5.0000 mg | ORAL_TABLET | ORAL | Status: DC
  Administered 2016-02-29 – 2016-03-01 (×2): 5 mg via ORAL
  Filled 2016-02-29 (×6): qty 1

## 2016-02-29 MED ORDER — PANTOPRAZOLE SODIUM 40 MG PO TBEC
40.0000 mg | DELAYED_RELEASE_TABLET | Freq: Two times a day (BID) | ORAL | Status: DC
  Administered 2016-02-29 – 2016-03-06 (×13): 40 mg via ORAL
  Filled 2016-02-29 (×20): qty 1

## 2016-02-29 MED ORDER — ACETAMINOPHEN 325 MG PO TABS
650.0000 mg | ORAL_TABLET | Freq: Four times a day (QID) | ORAL | Status: DC | PRN
Start: 2016-02-29 — End: 2016-03-06
  Administered 2016-03-01 – 2016-03-02 (×3): 650 mg via ORAL
  Filled 2016-02-29 (×3): qty 2

## 2016-02-29 MED ORDER — TRAZODONE HCL 50 MG PO TABS
50.0000 mg | ORAL_TABLET | Freq: Every evening | ORAL | Status: DC | PRN
  Administered 2016-02-29 – 2016-03-05 (×8): 50 mg via ORAL
  Filled 2016-02-29 (×16): qty 1

## 2016-02-29 MED ORDER — LAMOTRIGINE 25 MG PO TABS
25.0000 mg | ORAL_TABLET | Freq: Every day | ORAL | Status: DC
  Administered 2016-02-29: 25 mg via ORAL
  Filled 2016-02-29 (×3): qty 1

## 2016-02-29 MED ORDER — ALUM & MAG HYDROXIDE-SIMETH 200-200-20 MG/5ML PO SUSP: 30.0000 mL | ORAL | Status: DC | PRN

## 2016-02-29 MED ORDER — ARIPIPRAZOLE 5 MG PO TABS
5.0000 mg | ORAL_TABLET | Freq: Every day | ORAL | Status: DC
  Administered 2016-02-29: 5 mg via ORAL
  Filled 2016-02-29 (×2): qty 1

## 2016-02-29 MED ORDER — LEVOTHYROXINE SODIUM 125 MCG PO TABS
250.0000 ug | ORAL_TABLET | Freq: Every day | ORAL | Status: DC
  Administered 2016-02-29 – 2016-03-06 (×7): 250 ug via ORAL
  Filled 2016-02-29 (×10): qty 2

## 2016-02-29 MED ORDER — MAGNESIUM HYDROXIDE 400 MG/5ML PO SUSP
30.0000 mL | Freq: Every day | ORAL | Status: DC | PRN
  Administered 2016-03-01: 30 mL via ORAL
  Filled 2016-02-29: qty 30

## 2016-02-29 MED ORDER — GABAPENTIN 100 MG PO CAPS
200.0000 mg | ORAL_CAPSULE | Freq: Two times a day (BID) | ORAL | Status: DC
Start: 2016-02-29 — End: 2016-03-06
  Administered 2016-03-01 – 2016-03-06 (×11): 200 mg via ORAL
  Filled 2016-02-29 (×18): qty 2

## 2016-02-29 MED ORDER — HYDROXYZINE HCL 25 MG PO TABS: 25.0000 mg | ORAL_TABLET | Freq: Three times a day (TID) | ORAL | Status: DC | PRN

## 2016-02-29 NOTE — Progress Notes (Signed)
Did not attend group 

## 2016-02-29 NOTE — [facility] Suicide Risk Assessment (Signed)
[facility] Admission Suicide Risk Assessment   Nursing information obtained from:  Patient Demographic factors:  Divorced or widowed, Caucasian, Low socioeconomic status, Living alone, Unemployed Current Mental Status:  Self-harm thoughts Loss Factors:  Loss of significant relationship Historical Factors:  Prior suicide attempts Risk Reduction Factors:  NA  Total Time spent with patient: 30 minutes Principal Problem: Schizoaffective disorder, bipolar type (HCC) Diagnosis:   Patient Active Problem List   Diagnosis Date Noted  . Hypothyroidism [E03.9] 02/21/2016  . Bilateral edema of lower extremity [R60.0] 02/21/2016  . Schizoaffective disorder, bipolar type (HCC) [F25.0] 02/17/2016   Subjective Data: Please see H&P.   Continued Clinical Symptoms:  Alcohol Use Disorder Identification Test Final Score (AUDIT): 1 The "Alcohol Use Disorders Identification Test", Guidelines for Use in Primary Care, Second Edition.  World Science writer [facility]). Score between 0-7:  no or low risk or alcohol related problems. Score between 8-15:  moderate risk of alcohol related problems. Score between 16-19:  high risk of alcohol related problems. Score 20 or above:  warrants further diagnostic evaluation for alcohol dependence and treatment.   CLINICAL FACTORS:   Unstable or Poor Therapeutic Relationship Previous Psychiatric Diagnoses and Treatments Medical Diagnoses and Treatments/Surgeries   Psychiatric Specialty Exam: Physical Exam  Nursing note and vitals reviewed. Constitutional:  I concur with PE done in ED.    Review of Systems  Psychiatric/Behavioral: Positive for hallucinations and suicidal ideas. The patient is nervous/anxious and has insomnia.   All other systems reviewed and are negative.   Blood pressure 117/88, pulse 91, temperature 97.9 F (36.6 C), temperature source Oral, resp. rate 18, height 5' 9.5" (1.765 m), weight (!) 141.5 kg (312 lb), last menstrual period  02/25/2016.Body mass index is 45.41 kg/m.   Please see H&P.   COGNITIVE FEATURES THAT CONTRIBUTE TO RISK:  Closed-mindedness, Polarized thinking and Thought constriction (tunnel vision)    SUICIDE RISK:   Moderate:  Frequent suicidal ideation with limited intensity, and duration, some specificity in terms of plans, no associated intent, good self-control, limited dysphoria/symptomatology, some risk factors present, and identifiable protective factors, including available and accessible social support.   PLAN OF CARE: Please see H&P.   I certify that inpatient services furnished can reasonably be expected to improve the patient's condition.  Briceson Broadwater, MD 02/29/2016, 12:45 PM

## 2016-02-29 NOTE — Progress Notes (Signed)
Patient ID: Meghan Hensley, female   DOB: 21-Feb-1971, 45 y.o.   MRN: [id number] Per State regulations 482.30 this chart was reviewed for medical necessity with respect to the patient's admission/duration of stay.    Next review date: 03/03/16  Thurman Coyer, BSN, RN-BC  Case Manager

## 2016-02-29 NOTE — Tx Team (Signed)
Interdisciplinary Treatment Plan Update (Adult)  Date:  02/29/2016   Time Reviewed:  3:15 PM   Progress in Treatment: Attending groups: Yes. Participating in groups:  Yes. Taking medication as prescribed:  Yes. Tolerating medication:  Yes. Family/Significant other contact made:  No Patient understands diagnosis:  Yes  As evidenced by seeking help with "I need someone to help me with things like cooking." Discussing patient identified problems/goals with staff:  Yes, see initial care plan. Medical problems stabilized or resolved:  Yes. Denies suicidal/homicidal ideation: Yes. Issues/concerns per patient self-inventory:  No. Other:  New problem(s) identified:  Discharge Plan or Barriers: see below  Reason for Continuation of Hospitalization: Depression Medication stabilization  Comments:  Meghan Hensley is an 45 y.o. single female who presents unaccompanied to [place] reporting symptoms of anxiety, worthlessness, auditory hallucinations and disorganized thinking. Pt was discharged from [facility] 02/24/16 and then presented to [facility] the next day reporting similar symptoms. Pt states she is unable to take her synthroid and psychiatric medications because "I can't understand how to take the medications." Pt feels there is something wrong with her and "I feel destabilized."Pt first denies she is experiencing auditory hallucinations then says she hears voices. Will restart Lamictal 25 mg po daily for mood sx. Will start Abilify 5 mg po qhs for psychosis/mood sx. Will add Neurontin 200 mg po bid for mood sx, anxiety sx. Will continue Trazodone 50 mg po qhs for sleep. Estimated length of stay:  4-5 days  New goal(s):  Review of initial/current patient goals per problem list:   Review of initial/current patient goals per problem list:  1. Goal(s): Patient will participate in aftercare plan   Met: No   Target date: 3-5 days post admission date   As evidenced by: Patient will  participate within aftercare plan AEB aftercare provider and housing plan at discharge being identified. 03/01/16:  "I need someone to help me every day.  I don't have anyone right now."   2. Goal (s): Patient will exhibit decreased depressive symptoms and suicidal ideations.   Met: No   Target date: 3-5 days post admission date   As evidenced by: Patient will utilize self rating of depression at 3 or below and demonstrate decreased signs of depression or be deemed stable for discharge by MD. 03/01/16:  Rates depression a 5 today   5. Goal(s): Patient will demonstrate decreased signs of psychosis  * Met: No  * Target date: 3-5 days post admission date  * As evidenced by: Patient will demonstrate decreased frequency of AVH or return to baseline function 03/01/16:  Pt presents as vague, guarded, suspicious      Attendees: Patient:  02/29/2016 3:15 PM   Family:   02/29/2016 3:15 PM   Physician:  Ursula Alert, MD 02/29/2016 3:15 PM   Nursing:   Jeanie Cooks, RN 02/29/2016 3:15 PM   CSW:    Roque Lias, LCSW   02/29/2016 3:15 PM   Other:  02/29/2016 3:15 PM   Other:   02/29/2016 3:15 PM   Other:  Lars Pinks, Nurse CM 02/29/2016 3:15 PM   Other:   02/29/2016 3:15 PM   Other:  Norberto Sorenson, [place]  02/29/2016 3:15 PM   Other:  02/29/2016 3:15 PM   Other:  02/29/2016 3:15 PM   Other:  02/29/2016 3:15 PM   Other:  02/29/2016 3:15 PM   Other:  02/29/2016 3:15 PM   Other:   02/29/2016 3:15 PM    Scribe for Treatment Team:  Trish Mage, 02/29/2016 3:15 PM

## 2016-02-29 NOTE — BHH Group Notes (Signed)
[facility] Group Notes:  (Nursing/MHT/Case Management/Adjunct)  Date:  02/29/2016  Time:  4:35 PM  Type of Therapy:  Nurse Education  Participation Level:  Active  Participation Quality:  Appropriate and Attentive  Affect:  Appropriate  Cognitive:  Alert and Appropriate  Insight:  Appropriate and Good  Engagement in Group:  Engaged  Modes of Intervention:  Discussion and Education  Summary of Progress/Problems: Topic was on Recovery.  Discussed what recovery means to the patient.  Emphasize recovery as a process and to celebrate any milestone or accomplishment during the process.  Patient was attentive and receptive. [name] 02/29/2016, 4:34 PM  [name] 02/29/2016, 4:35 PM

## 2016-02-29 NOTE — Progress Notes (Signed)
Meghan Hensley has been admitted and oriented to the unit. She is currently in her room taking care of her hygiene needs. She denies any pain at this time. She rates Anxiety and Depression both 4-10. Endorsing chronic low back pain 4/10. She is noted to have signification B/L lower extremity edema (+4)  for which she states she has not been treated for. She endorses passive SI stating "it comes and goes" however she contracts for safety. She is not able to identify and supportive friends for family members. Her father whom she was very close to died last 02-05-2015. She is not close to her mother and does not consider her a support system. She endorses AVH in which she sees and hears "a good angel that comes to comfort me when I am alone. I don't see or hear my angel when I am around others. Only when I am by myself". Meghan Hensley lives alone and states upon discharge her plan is to return to her home alone. During our conversation Meghan Hensley is observed to be tangential and very disorganized. She is very pleasant but requires frequent redirection during the assessment.  A: Encouragement and support given. Q15 minute room checks for patient safety.  R: Continue to monitor for patient safety and medication effectiveness.

## 2016-02-29 NOTE — H&P (Signed)
Psychiatric Admission Assessment Adult  Patient Identification: Meghan Hensley MRN:  [id number] Date of Evaluation:  02/29/2016 Chief Complaint: Patient states " I am partially suicidal.'  Principal Diagnosis: Schizoaffective disorder, bipolar type ([place]) Diagnosis:   Patient Active Problem List   Diagnosis Date Noted  . Hypothyroidism [E03.9] 02/21/2016  . Bilateral edema of lower extremity [R60.0] 02/21/2016  . Schizoaffective disorder, bipolar type ([place]) [F25.0] 02/17/2016   History of Present Illness:Meghan Hensley is an 45 y.o. female caucasian female , single , is homeless , has a hx of schizoaffective disorder as well as hypothyroidism , was recently discharged from [facility] on 02/25/16. Pt per EHR notes - presented to [facility] as well as [facility]  Several times after that with somatic complaints. Pt had CT scan done in ED which was negative. Pt was medically cleared by EDP. However , pt reported suicidal thoughts with plan to drown self and hence was admitted to [facility].    Patient seen and chart reviewed today .Discussed patient with treatment team. Patient today seen with pressured speech , tangential thought process and appears delusional. Pt reports she is paranoid about every body including Probation officer and believes that we are all out to get her. Pt reports she felt she was having a stroke and hence was medically evaluated at the ED and had CT scan of her brain done. Pt reports she is 'partially suicidal" and had reported plan to drown self. Pt reports she is a very religious person and was seeing hearing angels . Pt with loose thought process and flight of ideas and appears to have somatic delusions. Pt reports she were noncompliant on her medications after discharge. Pt denies abusing any substances.  - Associated Signs/Symptoms: Depression Symptoms:  depressed mood, difficulty concentrating, suicidal thoughts with specific plan, anxiety, (Hypo) Manic Symptoms:   Delusions, Distractibility, Hallucinations, Impulsivity, Labiality of Mood, Anxiety Symptoms:  Excessive Worry, Psychotic Symptoms:  Delusions, Hallucinations: Auditory Visual Paranoia, PTSD Symptoms: NA Total Time spent with patient: 45 minutes  Past Psychiatric History: Patient with hx of schizoaffective disorder, hypothyroidism , noncompliance with medications - has had several IP admissions in the past - most recently was discharged on 02/25/16 from [facility].  Is the patient at risk to self? Yes.    Has the patient been a risk to self in the past 6 months? Yes.    Has the patient been a risk to self within the distant past? Yes.    Is the patient a risk to others? Yes.    Has the patient been a risk to others in the past 6 months? No.  Has the patient been a risk to others within the distant past? No.   Prior Inpatient Therapy: Prior Inpatient Therapy: Yes Prior Therapy Dates: 02/25/16 Prior Therapy Facilty/Provider(s): [facility] Reason for Treatment: Schizoaffective disorder Prior Outpatient Therapy: Prior Outpatient Therapy: Yes Prior Therapy Dates: unknown Prior Therapy Facilty/Provider(s): unknown Reason for Treatment: Schzioaffective disorder Does patient have an ACCT team?: No Does patient have Intensive In-House Services?  : No Does patient have [facility] services? : No Does patient have P4CC services?: No  Alcohol Screening: 1. How often do you have a drink containing alcohol?: Monthly or less 2. How many drinks containing alcohol do you have on a typical day when you are drinking?: 1 or 2 3. How often do you have six or more drinks on one occasion?: Never Preliminary Score: 0 4. How often during the last year have you found that you were not able to stop  drinking once you had started?: Never 5. How often during the last year have you failed to do what was normally expected from you becasue of drinking?: Never 6. How often during the last year have you needed a first  drink in the morning to get yourself going after a heavy drinking session?: Never 7. How often during the last year have you had a feeling of guilt of remorse after drinking?: Never 8. How often during the last year have you been unable to remember what happened the night before because you had been drinking?: Never 9. Have you or someone else been injured as a result of your drinking?: No 10. Has a relative or friend or a doctor or another health worker been concerned about your drinking or suggested you cut down?: No Alcohol Use Disorder Identification Test Final Score (AUDIT): 1 Brief Intervention: AUDIT score less than 7 or less-screening does not suggest unhealthy drinking-brief intervention not indicated Substance Abuse History in the last 12 months:  No. Consequences of Substance Abuse: NA Previous Psychotropic Medications: Yes - she is unable to remember names.Was tried on prozac during her last admissions. Psychological Evaluations: No  Past Medical History:  Past Medical History:  Diagnosis Date  . Bipolar 1 disorder ([place])   . Schizophrenia ([place])   . Thyroid disease    History reviewed. No pertinent surgical history. Family History:  Family History  Problem Relation Age of Onset  . Cancer Father   . Mental illness Neg Hx    Family Psychiatric  History: denies  Tobacco Screening:denies Social History: Patient is single , currently homeless after her father passed away.denies having any children. History  Alcohol Use No     History  Drug Use No    Additional Social History:  Allergies:  No Known Allergies Lab Results:  Results for orders placed or performed during the hospital encounter of 02/28/16 (from the past 48 hour(s))  CBC with Differential     Status: Abnormal   Collection Time: 02/28/16  4:28 PM  Result Value Ref Range   WBC 9.3 4.0 - 10.5 K/uL   RBC 3.86 (L) 3.87 - 5.11 MIL/uL   Hemoglobin 11.1 (L) 12.0 - 15.0 g/dL   HCT 34.4 (L) 36.0 - 46.0 %   MCV  89.1 78.0 - 100.0 fL   MCH 28.8 26.0 - 34.0 pg   MCHC 32.3 30.0 - 36.0 g/dL   RDW 15.6 (H) 11.5 - 15.5 %   Platelets 280 150 - 400 K/uL   Neutrophils Relative % 75 %   Neutro Abs 7.1 1.7 - 7.7 K/uL   Lymphocytes Relative 19 %   Lymphs Abs 1.8 0.7 - 4.0 K/uL   Monocytes Relative 4 %   Monocytes Absolute 0.3 0.1 - 1.0 K/uL   Eosinophils Relative 2 %   Eosinophils Absolute 0.2 0.0 - 0.7 K/uL   Basophils Relative 0 %   Basophils Absolute 0.0 0.0 - 0.1 K/uL  Basic metabolic panel     Status: Abnormal   Collection Time: 02/28/16  4:28 PM  Result Value Ref Range   Sodium 140 135 - 145 mmol/L   Potassium 3.8 3.5 - 5.1 mmol/L   Chloride 106 101 - 111 mmol/L   CO2 27 22 - 32 mmol/L   Glucose, Bld 95 65 - 99 mg/dL   BUN 11 6 - 20 mg/dL   Creatinine, Ser 1.11 (H) 0.44 - 1.00 mg/dL   Calcium 9.0 8.9 - 10.3 mg/dL   GFR calc  non Af Amer 60 (L) >60 mL/min   GFR calc Af Amer >60 >60 mL/min    Comment: (NOTE) The eGFR has been calculated using the CKD EPI equation. This calculation has not been validated in all clinical situations. eGFR's persistently <60 mL/min signify possible Chronic Kidney Disease.    Anion gap 7 5 - 15    Blood Alcohol level:  Lab Results  Component Value Date   ETH <5 02/27/2016   ETH <5 03/13/4817    Metabolic Disorder Labs:  No results found for: HGBA1C, MPG No results found for: PROLACTIN No results found for: CHOL, TRIG, HDL, CHOLHDL, VLDL, LDLCALC  Current Medications: Current Facility-Administered Medications  Medication Dose Route Frequency Provider Last Rate Last Dose  . acetaminophen (TYLENOL) tablet 650 mg  650 mg Oral Q6H PRN Laverle Hobby, PA-C      . alum & mag hydroxide-simeth (MAALOX/MYLANTA) 200-200-20 MG/5ML suspension 30 mL  30 mL Oral Q4H PRN Laverle Hobby, PA-C      . ARIPiprazole (ABILIFY) tablet 5 mg  5 mg Oral Daily Laverle Hobby, PA-C   5 mg at 02/29/16 0827  . gabapentin (NEURONTIN) capsule 200 mg  200 mg Oral BID Ursula Alert, MD      . hydrOXYzine (ATARAX/VISTARIL) tablet 25 mg  25 mg Oral TID PRN Ursula Alert, MD      . lamoTRIgine (LAMICTAL) tablet 25 mg  25 mg Oral QHS Spencer E Simon, PA-C      . levothyroxine (SYNTHROID, LEVOTHROID) tablet 250 mcg  250 mcg Oral QAC breakfast Laverle Hobby, PA-C   250 mcg at 02/29/16 0631  . LORazepam (ATIVAN) tablet 1 mg  1 mg Oral Q4H PRN Ursula Alert, MD       Or  . LORazepam (ATIVAN) injection 1 mg  1 mg Intramuscular Q4H PRN Ursula Alert, MD      . magnesium hydroxide (MILK OF MAGNESIA) suspension 30 mL  30 mL Oral Daily PRN Laverle Hobby, PA-C      . pantoprazole (PROTONIX) EC tablet 40 mg  40 mg Oral BID AC Laverle Hobby, PA-C   40 mg at 02/29/16 0631  . traZODone (DESYREL) tablet 50 mg  50 mg Oral QHS,MR X 1 Laverle Hobby, PA-C       PTA Medications: Prescriptions Prior to Admission  Medication Sig Dispense Refill Last Dose  . gabapentin (NEURONTIN) 100 MG capsule Take 2 capsules (200 mg total) by mouth 2 (two) times daily. 120 capsule 0 Past Week at Unknown time  . hydrOXYzine (ATARAX/VISTARIL) 25 MG tablet Take 1 tablet (25 mg total) by mouth every 6 (six) hours as needed for anxiety. 30 tablet 0 Past Week at Unknown time  . lamoTRIgine (LAMICTAL) 25 MG tablet Take 1 tablet (25 mg total) by mouth at bedtime. 30 tablet 0 Past Week at Unknown time  . levothyroxine (SYNTHROID, LEVOTHROID) 125 MCG tablet Take 2 tablets (250 mcg total) by mouth daily before breakfast. 60 tablet 0 02/27/2016 at Unknown time  . pantoprazole (PROTONIX) 40 MG tablet Take 1 tablet (40 mg total) by mouth 2 (two) times daily before a meal. 60 tablet 0 02/27/2016 at unsure if she had 2nd dose  . traZODone (DESYREL) 50 MG tablet Take 1 tablet (50 mg total) by mouth at bedtime as needed for sleep. 30 tablet 0 Past Week at Unknown time    Musculoskeletal: Strength & Muscle Tone: within normal limits Gait & Station: normal Patient leans: N/A  Psychiatric Specialty  Exam: Physical Exam  Nursing note and vitals reviewed. Constitutional:  I concur with PE done in ED  Musculoskeletal: She exhibits edema (BL LE).  Psychiatric: Her mood appears anxious. Her affect is not inappropriate. Her speech is not slurred. She is not agitated, not aggressive, not hyperactive and not combative. Thought content is not paranoid. Cognition and memory are not impaired. She exhibits a depressed mood. She expresses no homicidal and no suicidal ideation. She is communicative. She exhibits normal recent memory and normal remote memory.    Review of Systems  Constitutional: Negative.   HENT: Negative.   Eyes: Negative.   Respiratory: Negative.   Gastrointestinal: Negative.   Genitourinary: Negative.   Musculoskeletal: Negative.   Skin: Negative.   Neurological: Negative.   Endo/Heme/Allergies: Negative.   Psychiatric/Behavioral: Positive for depression. Negative for hallucinations and suicidal ideas. The patient is nervous/anxious.   All other systems reviewed and are negative.   Blood pressure 117/88, pulse 91, temperature 97.9 F (36.6 C), temperature source Oral, resp. rate 18, height 5' 9.5" (1.765 m), weight (!) 141.5 kg (312 lb), last menstrual period 02/25/2016.Body mass index is 45.41 kg/m.  General Appearance: Disheveled  Eye Contact:  Fair  Speech:  Pressured  Volume:  Increased  Mood:  Anxious and Dysphoric  Affect:  Labile  Thought Process:  Disorganized, Irrelevant and Descriptions of Associations: Tangential  Orientation:  Full (Time, Place, and Person)  Thought Content:  Delusions, Hallucinations: Auditory Visual, Paranoid Ideation and Rumination was seeing angels and hearing them - denies today - but is paranoid and delusional  Suicidal Thoughts:  Yes.  with intent/plandrown self  Homicidal Thoughts:  No  Memory:  Immediate;   Poor Recent;   Poor Remote;   Poor  Judgement:  Fair  Insight:  Lacking  Psychomotor Activity:  Normal  Concentration:   Concentration: Fair and Attention Span: Fair  Recall:  [employer] of Knowledge:  Fair  Language:  Good  Akathisia:  Negative  Handed:  Right  AIMS (if indicated):     Assets:  Communication Skills  ADL's:  Intact  Cognition:  WNL  Sleep:      Treatment Plan Summary:Meghan Hensley is an 45 y.o. female caucasian female , single , is homeless , has a hx of schizoaffective disorder as well as hypothyroidism , was recently discharged from [facility] on 02/25/16 presented again with psychosis and SI with plan.  Patient will benefit from inpatient treatment and stabilization.  Estimated length of stay is 5-7 days.  Reviewed past medical records,treatment plan.  Will restart Lamictal 25 mg po daily for mood sx. Will start Abilify 5 mg po qhs for psychosis/mood sx. Will add Neurontin 200 mg po bid for mood sx, anxiety sx. Will continue Trazodone 50 mg po qhs for sleep. Will make available PRN medications as per agitation protocol. Will continue to monitor vitals ,medication compliance and treatment side effects while patient is here.  Will monitor for medical issues as well as call consult as needed. Will continue to replace Levothyroxine 250 mcg for hypothyroidism. Per Hospitalist consult done last week - pt to continue this dose and her pedal edema 2/2 to her thyroid condition - symptomatic treatment . Reviewed labs - pt was medically cleared from ED- TSH was elevated on 02/17/16 >90,000 - will order lipid panel, hba1c if not already dose. CSW will start working on disposition.  Patient to participate in therapeutic milieu .       Observation Level/Precautions:  15 minute checks    Psychotherapy: Individual and group therapy     Consultations:  As needed  Discharge Concerns:  Safety and stability       I certify that inpatient services furnished can reasonably be expected to improve the patient's condition.    Meghan Shackett, MD [facility] 8/1/20171:09 PM

## 2016-02-29 NOTE — [facility] Group Notes (Signed)
[facility] LCSW Group Therapy  02/29/2016 , 3:13 PM   Type of Therapy:  Group Therapy  Participation Level:  Active  Participation Quality:  Attentive  Affect:  Appropriate  Cognitive:  Alert  Insight:  Improving  Engagement in Therapy:  Engaged  Modes of Intervention:  Discussion, Exploration and Socialization  Summary of Progress/Problems: Today's group focused on the term Diagnosis.  Participants were asked to define the term, and then pronounce whether it is a negative, positive or neutral term.  Stayed the entire time, engaged throughout.  Contributions were subject appropriate; but tangential and difficult to follow.    [name] 02/29/2016 , 3:13 PM

## 2016-02-29 NOTE — Tx Team (Signed)
Initial Interdisciplinary Treatment Plan   PATIENT STRESSORS: Loss of father June 2016 Marital or family conflict   PATIENT STRENGTHS: Average or above average intelligence Motivation for treatment/growth   PROBLEM LIST: Problem List/Patient Goals Date to be addressed Date deferred Reason deferred Estimated date of resolution  "Help me with my daily routine and basic needs" 02-29-2016     "Need to be able to take care of my depression" 02-29-2016     Depression 02-29-2016     Suicidal ideation 02-29-2016                                    DISCHARGE CRITERIA:  Ability to meet basic life and health needs Improved stabilization in mood, thinking, and/or behavior Safe-care adequate arrangements made Verbal commitment to aftercare and medication compliance  PRELIMINARY DISCHARGE PLAN: Outpatient therapy Return to previous living arrangement  PATIENT/FAMIILY INVOLVEMENT: This treatment plan has been presented to and reviewed with the patient, Meghan Hensley.  The patient and family have been given the opportunity to ask questions and make suggestions.  Claiborne Rigg 02/29/2016, 12:43 AM

## 2016-02-29 NOTE — Progress Notes (Signed)
DAR NOTE: Patient presents with flat affect and depressed mood.  Denies pain, auditory and visual hallucinations.    Maintained on routine safety checks.  Patient refused all prescribed medication after several attempts and education.  Patient states she needs more time to review all her medications.  MD made aware of medication noncompliance.    Support and encouragement offered as needed.  Attended group and participated.

## 2016-03-01 LAB — LIPID PANEL
CHOL/HDL RATIO: 5.9 ratio
CHOLESTEROL: 160 mg/dL (ref 0–200)
HDL: 27 mg/dL — AB (ref >40)
LDL Cholesterol: 86 mg/dL (ref 0–99)
Triglycerides: 233 mg/dL — ABNORMAL HIGH (ref <150)
VLDL: 47 mg/dL — ABNORMAL HIGH (ref 0–40)

## 2016-03-01 MED ORDER — ARIPIPRAZOLE 10 MG PO TABS
10.0000 mg | ORAL_TABLET | ORAL | Status: DC
  Administered 2016-03-01 – 2016-03-03 (×4): 10 mg via ORAL
  Filled 2016-03-01 (×6): qty 1

## 2016-03-01 MED ORDER — DIVALPROEX SODIUM ER 500 MG PO TB24
750.0000 mg | ORAL_TABLET | Freq: Every day | ORAL | Status: DC
  Administered 2016-03-01 – 2016-03-05 (×5): 750 mg via ORAL
  Filled 2016-03-01 (×7): qty 1

## 2016-03-01 NOTE — [facility] Group Notes (Signed)
[facility] [employer] Group Therapy  03/01/2016 , 1:02 PM    Type of Therapy:  [employer] Presentation  Participation Level:  Active  Participation Quality:  Attentive  Affect:  Blunted  Cognitive:  Oriented  Insight:  Limited  Engagement in Therapy:  Engaged  Modes of Intervention:  Discussion, Education and Socialization  Summary of Progress/Problems:  Onalee Hua from [employer] came to present his recovery story and play the guitar.  Invited.  Chose to not attend.  [name] 03/01/2016 , 1:02 PM

## 2016-03-01 NOTE — Progress Notes (Addendum)
[facility] MD Progress Note  03/01/2016 3:23 PM Meghan Hensley  MRN:  [id number]   Subjective:  "I feel less anxious ."       Objective:  Patient seen and chart reviewed.Discussed patient with treatment team.  Pt today seen in bed , initially this AM pt did not cooperate with evaluation, excused herself to use the "bathroom"  , pt was later seen again this afternoon. Pt reports she is less anxious and her thoughts are getting more together. Pt continues to have loose thought process , appears to be less paranoid than on admission. Pt reports lamictal last night made her feel ill and wants it changed . Discussed depakote - she agrees with plan.        Principal Problem: Schizoaffective disorder, bipolar type (HCC) Diagnosis:   Patient Active Problem List   Diagnosis Date Noted  . Hypothyroidism [E03.9] 02/21/2016  . Bilateral edema of lower extremity [R60.0] 02/21/2016  . Schizoaffective disorder, bipolar type (HCC) [F25.0] 02/17/2016   Total Time spent with patient: 30 minutes  Past Psychiatric History: see HPI   Past Medical History:  Past Medical History:  Diagnosis Date  . Bipolar 1 disorder (HCC)   . Schizophrenia (HCC)   . Thyroid disease    Family History: Please see H&P.  Family Psychiatric  History: see HPI Social History: Patient is single , her mother lives in [place]. Pt is on SSD. History  Alcohol Use No     History  Drug Use No    Social History   Social History  . Marital status: Divorced    Spouse name: N/A  . Number of children: N/A  . Years of education: N/A   Social History Main Topics  . Smoking status: Former Games developer  . Smokeless tobacco: Never Used  . Alcohol use No  . Drug use: No  . Sexual activity: Not Currently   Other Topics Concern  . None   Social History Narrative  . None   Additional Social History:    Pain Medications: See PTA medication list Prescriptions: See PTA medication list.  Pt says she takes her Synthroid  pretty regularly.  Could not name her psychiatric meds. Over the Counter: See PTA medication list History of alcohol / drug use?: No history of alcohol / drug abuse Longest period of sobriety (when/how long): unknown                    Sleep: Fair  Appetite:  Fair improving  Current Medications: Current Facility-Administered Medications  Medication Dose Route Frequency Provider Last Rate Last Dose  . acetaminophen (TYLENOL) tablet 650 mg  650 mg Oral Q6H PRN Kerry Hough, PA-C   650 mg at 03/01/16 0953  . alum & mag hydroxide-simeth (MAALOX/MYLANTA) 200-200-20 MG/5ML suspension 30 mL  30 mL Oral Q4H PRN Kerry Hough, PA-C      . ARIPiprazole (ABILIFY) tablet 5 mg  5 mg Oral BH-qamhs Yana Schorr, MD   5 mg at 03/01/16 0951  . divalproex (DEPAKOTE ER) 24 hr tablet 750 mg  750 mg Oral QHS Coree Riester, MD      . gabapentin (NEURONTIN) capsule 200 mg  200 mg Oral BID Jomarie Longs, MD   200 mg at 03/01/16 0950  . hydrOXYzine (ATARAX/VISTARIL) tablet 25 mg  25 mg Oral TID PRN Jomarie Longs, MD      . levothyroxine (SYNTHROID, LEVOTHROID) tablet 250 mcg  250 mcg Oral QAC breakfast Kerry Hough, PA-C  250 mcg at 03/01/16 0651  . LORazepam (ATIVAN) tablet 1 mg  1 mg Oral Q4H PRN Jomarie Longs, MD   1 mg at 02/29/16 2314   Or  . LORazepam (ATIVAN) injection 1 mg  1 mg Intramuscular Q4H PRN Carmela Piechowski, MD      . magnesium hydroxide (MILK OF MAGNESIA) suspension 30 mL  30 mL Oral Daily PRN Kerry Hough, PA-C   30 mL at 03/01/16 0952  . pantoprazole (PROTONIX) EC tablet 40 mg  40 mg Oral BID AC Kerry Hough, PA-C   40 mg at 03/01/16 0651  . traZODone (DESYREL) tablet 50 mg  50 mg Oral QHS,MR X 1 Kerry Hough, PA-C   50 mg at 02/29/16 2114    Lab Results:  Results for orders placed or performed during the hospital encounter of 02/28/16 (from the past 48 hour(s))  Lipid panel     Status: Abnormal   Collection Time: 03/01/16  6:39 AM  Result Value Ref Range    Cholesterol 160 0 - 200 mg/dL   Triglycerides 409 (H) <150 mg/dL   HDL 27 (L) >81 mg/dL   Total CHOL/HDL Ratio 5.9 RATIO   VLDL 47 (H) 0 - 40 mg/dL   LDL Cholesterol 86 0 - 99 mg/dL    Comment:        Total Cholesterol/HDL:CHD Risk Coronary Heart Disease Risk Table                     Men   Women  1/2 Average Risk   3.4   3.3  Average Risk       5.0   4.4  2 X Average Risk   9.6   7.1  3 X Average Risk  23.4   11.0        Use the calculated Patient Ratio above and the CHD Risk Table to determine the patient's CHD Risk.        ATP III CLASSIFICATION (LDL):  <100     mg/dL   Optimal  191-478  mg/dL   Near or Above                    Optimal  130-159  mg/dL   Borderline  295-621  mg/dL   High  >308     mg/dL   Very High Performed at [facility]     Blood Alcohol level:  Lab Results  Component Value Date   [facility] <5 02/27/2016   ETH <5 02/17/2016    Metabolic Disorder Labs: No results found for: HGBA1C, MPG No results found for: PROLACTIN Lab Results  Component Value Date   CHOL 160 03/01/2016   TRIG 233 (H) 03/01/2016   HDL 27 (L) 03/01/2016   CHOLHDL 5.9 03/01/2016   VLDL 47 (H) 03/01/2016   LDLCALC 86 03/01/2016    Physical Findings: AIMS: Facial and Oral Movements Muscles of Facial Expression: None, normal Lips and Perioral Area: None, normal Jaw: None, normal Tongue: None, normal,Extremity Movements Upper (arms, wrists, hands, fingers): None, normal Lower (legs, knees, ankles, toes): None, normal, Trunk Movements Neck, shoulders, hips: None, normal, Overall Severity Severity of abnormal movements (highest score from questions above): None, normal Incapacitation due to abnormal movements: None, normal Patient's awareness of abnormal movements (rate only patient's report): No Awareness, Dental Status Current problems with teeth and/or dentures?: No Does patient usually wear dentures?: No  CIWA:    COWS:     Musculoskeletal: Strength &  Muscle  Tone: within normal limits Gait & Station: normal Patient leans: N/A  Psychiatric Specialty Exam: Physical Exam  Nursing note and vitals reviewed. Cardiovascular: Normal rate.   Psychiatric: Judgment and thought content normal. Her mood appears anxious. She is slowed. Cognition and memory are normal. She exhibits a depressed mood.    Review of Systems  Constitutional: Positive for malaise/fatigue.  HENT: Negative.   Eyes: Negative.   Respiratory: Negative.   Cardiovascular: Negative.   Gastrointestinal: Negative.   Genitourinary: Negative.   Musculoskeletal: Negative.   Skin: Negative.   Neurological: Negative.   Endo/Heme/Allergies: Negative.   Psychiatric/Behavioral: Positive for depression. The patient is nervous/anxious and has insomnia.   All other systems reviewed and are negative.   Blood pressure 101/74, pulse 99, temperature 98.7 F (37.1 C), temperature source Oral, resp. rate 16, height 5' 9.5" (1.765 m), weight (!) 141.5 kg (312 lb), last menstrual period 02/25/2016.Body mass index is 45.41 kg/m.  General Appearance: Fairly Groomed  Eye Contact:  Fair  Speech:  Pressured  Volume:  Normal  Mood:  Anxious  Affect:  Congruent  Thought Process:  Coherent  Orientation:  Full (Time, Place, and Person)  Thought Content:  Rumination improving  Suicidal Thoughts:  was suicidal with plan on admission- contracts for safety on the unit  Homicidal Thoughts:  No  Memory:  Immediate;   Fair Recent;   Fair Remote;   Fair  Judgement:  Fair  Insight:  Fair  Psychomotor Activity:  Normal  Concentration:  Concentration: Good and Attention Span: Good  Recall:  [employer] of Knowledge:  Fair  Language:  Fair  Akathisia:  Negative  Handed:  Right  AIMS (if indicated):     Assets:  Desire for Improvement Resilience  ADL's:  Intact  Cognition:  WNL  Sleep:        Treatment Plan Summary::Meghan Biebersteinis an 45 y.o.female caucasian female , single , is homeless , has  a hx of schizoaffective disorder as well as hypothyroidism , was recently discharged from [facility] on 02/25/16 presented again with psychosis and SI with plan.Pt today not tolerating lamictal - needs medication readjustment . Will continue treatment.  Will discontinue Lamictal for ADRs. Will start a trial of Depakote ER 750 mg po qhs for mood sx. Depakote level in 5 days. Will increase Abilify to 10 mg po BID for psychosis/mood sx. Will continue Neurontin 200 mg po bid for mood sx, anxiety sx. Will continue Trazodone 50 mg po qhs for sleep. Will make available PRN medications as per agitation protocol. Will continue to monitor vitals ,medication compliance and treatment side effects while patient is here.  Will monitor for medical issues as well as call consult as needed. Will continue to replace Levothyroxine 250 mcg for hypothyroidism. Per Hospitalist consult done last week - pt to continue this dose and her pedal edema 2/2 to her thyroid condition - symptomatic treatment . Reviewed labs - pt was medically cleared from ED- TSH was elevated on 02/17/16 >90,000 -  lipid panel- elevated  hba1c - pending. CSW will continue working on disposition.  Patient to participate in therapeutic milieu .  Masoud Nyce 03/01/2016 3:23 PM

## 2016-03-01 NOTE — Progress Notes (Signed)
Data. Patient denies SI/HI/AVH. Patient isolating in her room most of shift. Patient is very guarded in her interactions and her affect is blunted.Her goal today HY:IFOYDXAJOIN discharge. Action. Emotional support and encouragement offered. Education provided on medication, indications and side effect. Q 15 minute checks done for safety. Response. Safety on the unit maintained through 15 minute checks.  Medications taken as prescribed. Attended groups. Remained calm and appropriate through out shift.

## 2016-03-01 NOTE — Progress Notes (Signed)
D: Pt was in the dayroom upon initial approach.  Pt presents with anxious affect and mood.  She reports her day has been "fine" and "it's better than some of the other nights."  Reports her goal was to introduce herself to peers.  Pt reports she has been able to do this.  Her speech is tangential and she reports feelings of paranoia, stating "sometimes I get a feeling people want to hurt me."  Pt reports she has "been able to benefit from the group session the most here."  Pt denies SI/HI, denies hallucinations, reports lower back pain of 7/10.  Pt has interacted appropriately with peers and staff.  She attended evening group.    A: Introduced self to pt.  Actively listened to pt and offered support and encouragement.  Medication administered per order.  PRN medication administered for pain and anxiety.    R: Pt is compliant with medications.  She verbally contracts for safety.  Pt reports she will inform staff of needs and concerns.  Will continue to monitor and assess.

## 2016-03-01 NOTE — BHH Counselor (Signed)
Adult Comprehensive Assessment  Patient ID: Meghan Hensley, female   DOB: 11/29/1970, 45 y.o.   MRN: [id number]  Information Source: Information source: Patient  Current Stressors:  Employment / Job issues: unemployed, on disability Family Relationships: strained relationship with mother Surveyor, quantity / Lack of resources (include bankruptcy): fixed income Physical health (include injuries & life threatening diseases): reports multiple medical issues Social relationships: poor support system Bereavement / Loss: father passed away last year  Living/Environment/Situation:  Living Arrangements: Alone Living conditions (as described by patient or guardian): Pt lives in [place] alone.  Pt reports this is a good environment but lacks support. How long has patient lived in current situation?: 3 years What is atmosphere in current home: Comfortable  Family History:  Marital status: Single Does patient have children?: No  Childhood History:  By whom was/is the patient raised?: Both parents Additional childhood history information: Pt reports being raised by both parents.  Pt states her childhood was pretty good but parents were [place] and she had to translate and grow up fast to help them.   Description of patient's relationship with caregiver when they were a child: Pt reports getting along well with parents growing up.   Patient's description of current relationship with people who raised him/her: Father passed away last year, strained relationship with mother now How were you disciplined when you got in trouble as a child/adolescent?: scoulded and talked to, things taken away Does patient have siblings?: No Did patient suffer any verbal/emotional/physical/sexual abuse as a child?: No Did patient suffer from severe childhood neglect?: No Has patient ever been sexually abused/assaulted/raped as an adolescent or adult?: No Was the patient ever a victim of a crime or a disaster?:  No Witnessed domestic violence?: No Has patient been effected by domestic violence as an adult?: No  Education:  Highest grade of school patient has completed: BA degree Currently a student?: No Learning disability?: No  Employment/Work Situation:   Employment situation: On disability Why is patient on disability: mental health How long has patient been on disability: 4 years Patient's job has been impacted by current illness: No What is the longest time patient has a held a job?: 5 years Where was the patient employed at that time?: [employer] Has patient ever been in the [employer]?: No Has patient ever served in combat?: No Did You Receive Any Psychiatric Treatment/Services While in Equities trader?: No Are There Guns or Other Weapons in Your Home?: No  Financial Resources:   Surveyor, quantity resources: Insurance claims handler, Medicare Does patient have a Lawyer or guardian?: No  Alcohol/Substance Abuse:   What has been your use of drugs/alcohol within the last 12 months?: "not that I'm aware of"  If attempted suicide, did drugs/alcohol play a role in this?: No Alcohol/Substance Abuse Treatment Hx: Denies past history Has alcohol/substance abuse ever caused legal problems?: No  Social Support System:   Forensic psychologist System: None Describe Community Support System: pt denies having a support system Type of faith/religion: Catholic How does patient's faith help to cope with current illness?: prayer  Leisure/Recreation:   Leisure and Hobbies: Theatre stage manager, Retail buyer, Financial risk analyst, fashion, music, people  Strengths/Needs:   What things does the patient do well?: "a lot of different things", art, music, people In what areas does patient struggle / problems for patient: reports medical issues caused change in mental state  Discharge Plan:   Does patient have access to transportation?: Yes Will patient be returning to same living situation after discharge?:  Yes Currently  receiving community mental health services: No If no, would patient like referral for services when discharged?: Yes (What county?) [facility]) Does patient have financial barriers related to discharge medications?: No  Summary/Recommendations:   Summary and Recommendations (to be completed by the evaluator): Meghan Hensley is a 45 year old Caucasian female with a diagnosis of schizoaffective. Meghan Hensley was d/ced from our unit last Friday, and returned to the ED fairly immediately, and daily, until she successfully got herself readmitted on Monday. She was c/o confused thinking at the time.  Since then, it has become clear that she is homeless, though she describes herself "in transition between [place]."  When pressed about dispositional plan at d/c, Meghan Hensley becomes guarded, suspicious and vague. Meghan Hensley will benefit from crisis stabilization, medication evaluation, group therapy and psycho education in addition to case management for discharge planning.   Daryel Gerald 03/01/2016

## 2016-03-01 NOTE — Progress Notes (Signed)
   D: Pt discussed side effects of meds. Was focused on "sleepiness and flying high type of feeling".  Informed the writer that she doesn't like one of the meds because it makes her sleepy. However, writer reminded the pt that she was taking it a night at bedtime. Pt has no questions or concerns.    A:  Support and encouragement was offered. 15 min checks continued for safety.  R: Pt remains safe.

## 2016-03-02 LAB — HEMOGLOBIN A1C
Hgb A1c MFr Bld: 5.1 % (ref 4.8–5.6)
Mean Plasma Glucose: 100 mg/dL

## 2016-03-02 MED ORDER — ARIPIPRAZOLE ER 400 MG IM SUSR
400.0000 mg | INTRAMUSCULAR | Status: DC
  Filled 2016-03-02: qty 400

## 2016-03-02 NOTE — Progress Notes (Signed)
  [facility] Adult Case Management Discharge Plan : Late Entry for CSW Rod Washington  Will you be returning to the same living situation after discharge:  No.Pt plans to go to homeless shelter At discharge, do you have transportation home?: Yes,  states she has car Do you have the ability to pay for your medications: Yes,  has income and Medicare  Release of information consent forms completed and in the chart;  Patient's signature needed at discharge.  Patient to Follow up at: Follow-up Information    [facility] .   Specialty:  Behavioral Health Why:  [facility] on M-F between 8 and 10AM for your hospital follow up appointment if needed.   Contact information: [address] [place] [address] [phone]        [facility] Follow up on 03/07/2016.   Why:  Initial appointment for primary care on August 8 at 11:15 AM. Please bring hospital discharge paperwork to this appointment.  Call to cancel/reschedule if needed.  Please follow up on your thyroid level.  Started Protonix 40 update MD if symptom relief Contact information: [address] [place]  [address] Phone:  [phone] Fax:  [phone] - primary care fax Fax:  [phone] - mental health fax           Next level of care provider has access to [facility] Link:no  Has patient been referred to the Quitline?: N/A patient is not a smoker  Patient has been referred for addiction treatment: Yes  Sallee Lange 03/02/2016, 4:17 PM

## 2016-03-02 NOTE — Progress Notes (Signed)
[facility] MD Progress Note  03/02/2016 3:24 PM Meghan Hensley  MRN:  [id number]   Subjective:  "I feel better on this combination.'        Objective:  Patient seen and chart reviewed.Discussed patient with treatment team.  Pt today seen as having pressured speech , however is more redirectable than on admission. Pt continues to be guarded , delusional and paranoid - more so when talking about her personal information like where she lives and so on. Pt has been tolerating her medications well, denies ADRs. Per staff - pt is more visible in milieu - continues need encouragement and support.        Principal Problem: Schizoaffective disorder, bipolar type (HCC) Diagnosis:   Patient Active Problem List   Diagnosis Date Noted  . Hypothyroidism [E03.9] 02/21/2016  . Bilateral edema of lower extremity [R60.0] 02/21/2016  . Schizoaffective disorder, bipolar type (HCC) [F25.0] 02/17/2016   Total Time spent with patient: 25 minutes  Past Psychiatric History: see HPI   Past Medical History:  Past Medical History:  Diagnosis Date  . Bipolar 1 disorder (HCC)   . Schizophrenia (HCC)   . Thyroid disease    Family History: Please see H&P.  Family Psychiatric  History: see HPI Social History: Patient is single , her mother lives in [place]. Pt is on SSD. History  Alcohol Use No     History  Drug Use No    Social History   Social History  . Marital status: Divorced    Spouse name: N/A  . Number of children: N/A  . Years of education: N/A   Social History Main Topics  . Smoking status: Former Games developer  . Smokeless tobacco: Never Used  . Alcohol use No  . Drug use: No  . Sexual activity: Not Currently   Other Topics Concern  . None   Social History Narrative  . None   Additional Social History:    Pain Medications: See PTA medication list Prescriptions: See PTA medication list.  Pt says she takes her Synthroid pretty regularly.  Could not name her psychiatric  meds. Over the Counter: See PTA medication list History of alcohol / drug use?: No history of alcohol / drug abuse Longest period of sobriety (when/how long): unknown                    Sleep: Fair  Appetite:  Fair improving  Current Medications: Current Facility-Administered Medications  Medication Dose Route Frequency Provider Last Rate Last Dose  . acetaminophen (TYLENOL) tablet 650 mg  650 mg Oral Q6H PRN Kerry Hough, PA-C   650 mg at 03/02/16 7829  . alum & mag hydroxide-simeth (MAALOX/MYLANTA) 200-200-20 MG/5ML suspension 30 mL  30 mL Oral Q4H PRN Kerry Hough, PA-C      . ARIPiprazole (ABILIFY) tablet 10 mg  10 mg Oral BH-qamhs Jomarie Longs, MD   10 mg at 03/02/16 0823  . [START ON 03/03/2016] ARIPiprazole ER SUSR 400 mg  400 mg Intramuscular Q28 days Jomarie Longs, MD      . divalproex (DEPAKOTE ER) 24 hr tablet 750 mg  750 mg Oral QHS Jomarie Longs, MD   750 mg at 03/01/16 2109  . gabapentin (NEURONTIN) capsule 200 mg  200 mg Oral BID Jomarie Longs, MD   200 mg at 03/02/16 5621  . hydrOXYzine (ATARAX/VISTARIL) tablet 25 mg  25 mg Oral TID PRN Jomarie Longs, MD      . levothyroxine (SYNTHROID, LEVOTHROID) tablet 250 mcg  250 mcg Oral QAC breakfast Kerry Hough, PA-C   250 mcg at 03/02/16 1610  . LORazepam (ATIVAN) tablet 1 mg  1 mg Oral Q4H PRN Jomarie Longs, MD   1 mg at 03/01/16 2109   Or  . LORazepam (ATIVAN) injection 1 mg  1 mg Intramuscular Q4H PRN Langley Ingalls, MD      . magnesium hydroxide (MILK OF MAGNESIA) suspension 30 mL  30 mL Oral Daily PRN Kerry Hough, PA-C   30 mL at 03/01/16 0952  . pantoprazole (PROTONIX) EC tablet 40 mg  40 mg Oral BID AC Kerry Hough, PA-C   40 mg at 03/02/16 9604  . traZODone (DESYREL) tablet 50 mg  50 mg Oral QHS,MR X 1 Kerry Hough, PA-C   50 mg at 03/01/16 2108    Lab Results:  Results for orders placed or performed during the hospital encounter of 02/28/16 (from the past 48 hour(s))  Lipid panel      Status: Abnormal   Collection Time: 03/01/16  6:39 AM  Result Value Ref Range   Cholesterol 160 0 - 200 mg/dL   Triglycerides 540 (H) <150 mg/dL   HDL 27 (L) >98 mg/dL   Total CHOL/HDL Ratio 5.9 RATIO   VLDL 47 (H) 0 - 40 mg/dL   LDL Cholesterol 86 0 - 99 mg/dL    Comment:        Total Cholesterol/HDL:CHD Risk Coronary Heart Disease Risk Table                     Men   Women  1/2 Average Risk   3.4   3.3  Average Risk       5.0   4.4  2 X Average Risk   9.6   7.1  3 X Average Risk  23.4   11.0        Use the calculated Patient Ratio above and the CHD Risk Table to determine the patient's CHD Risk.        ATP III CLASSIFICATION (LDL):  <100     mg/dL   Optimal  119-147  mg/dL   Near or Above                    Optimal  130-159  mg/dL   Borderline  829-562  mg/dL   High  >130     mg/dL   Very High Performed at [facility]   Hemoglobin A1c     Status: None   Collection Time: 03/01/16  6:39 AM  Result Value Ref Range   Hgb A1c MFr Bld 5.1 4.8 - 5.6 %    Comment: (NOTE)         Pre-diabetes: 5.7 - 6.4         Diabetes: >6.4         Glycemic control for adults with diabetes: <7.0    Mean Plasma Glucose 100 mg/dL    Comment: (NOTE) Performed At: [facility] [address] [place], [place] [id number] Mila Homer MD EX:[id number] Performed at [facility]     Blood Alcohol level:  Lab Results  Component Value Date   [facility] <5 02/27/2016   ETH <5 02/17/2016    Metabolic Disorder Labs: Lab Results  Component Value Date   HGBA1C 5.1 03/01/2016   MPG 100 03/01/2016   No results found for: PROLACTIN Lab Results  Component Value Date   CHOL 160 03/01/2016  TRIG 233 (H) 03/01/2016   HDL 27 (L) 03/01/2016   CHOLHDL 5.9 03/01/2016   VLDL 47 (H) 03/01/2016   LDLCALC 86 03/01/2016    Physical Findings: AIMS: Facial and Oral Movements Muscles of Facial Expression: None, normal Lips and Perioral Area: None, normal Jaw: None,  normal Tongue: None, normal,Extremity Movements Upper (arms, wrists, hands, fingers): None, normal Lower (legs, knees, ankles, toes): None, normal, Trunk Movements Neck, shoulders, hips: None, normal, Overall Severity Severity of abnormal movements (highest score from questions above): None, normal Incapacitation due to abnormal movements: None, normal Patient's awareness of abnormal movements (rate only patient's report): No Awareness, Dental Status Current problems with teeth and/or dentures?: No Does patient usually wear dentures?: No  CIWA:    COWS:     Musculoskeletal: Strength & Muscle Tone: within normal limits Gait & Station: normal Patient leans: N/A  Psychiatric Specialty Exam: Physical Exam  Nursing note and vitals reviewed. Cardiovascular: Normal rate.   Psychiatric: Judgment and thought content normal. Her mood appears anxious. She is slowed. Cognition and memory are normal. She exhibits a depressed mood.    Review of Systems  Constitutional: Positive for malaise/fatigue.  HENT: Negative.   Eyes: Negative.   Respiratory: Negative.   Cardiovascular: Negative.   Gastrointestinal: Negative.   Genitourinary: Negative.   Musculoskeletal: Negative.   Skin: Negative.   Neurological: Negative.   Endo/Heme/Allergies: Negative.   Psychiatric/Behavioral: Positive for depression. The patient is nervous/anxious and has insomnia.   All other systems reviewed and are negative.   Blood pressure (!) 116/101, pulse 98, temperature 97.7 F (36.5 C), temperature source Oral, resp. rate 20, height 5' 9.5" (1.765 m), weight (!) 141.5 kg (312 lb), last menstrual period 02/25/2016.Body mass index is 45.41 kg/m.  General Appearance: Fairly Groomed  Eye Contact:  Fair  Speech:  Pressured  Volume:  Normal  Mood:  Anxious  Affect:  Congruent  Thought Process:  Coherent  Orientation:  Full (Time, Place, and Person)  Thought Content:  Rumination improving  Suicidal Thoughts:  was  suicidal with plan on admission- contracts for safety on the unit  Homicidal Thoughts:  No  Memory:  Immediate;   Fair Recent;   Fair Remote;   Fair  Judgement:  Fair  Insight:  Fair  Psychomotor Activity:  Normal  Concentration:  Concentration: Good and Attention Span: Good  Recall:  [employer] of Knowledge:  Fair  Language:  Fair  Akathisia:  Negative  Handed:  Right  AIMS (if indicated):     Assets:  Desire for Improvement Resilience  ADL's:  Intact  Cognition:  WNL  Sleep:  Number of Hours: 5.5     Treatment Plan Summary::Kenly Biebersteinis an 45 y.o.female caucasian female , single , is homeless , has a hx of schizoaffective disorder as well as hypothyroidism , was recently discharged from [facility] on 02/25/16 presented again with psychosis and SI with plan. Will continue treatment.  Will continue Depakote ER 750 mg po qhs for mood sx. Depakote level on 03/05/16. Will increase Abilify to 10 mg po BID for psychosis/mood sx. Will continue Neurontin 200 mg po bid for mood sx, anxiety sx. Will continue Trazodone 50 mg po qhs for sleep. Will make available PRN medications as per agitation protocol. Will continue to monitor vitals ,medication compliance and treatment side effects while patient is here.  Will monitor for medical issues as well as call consult as needed. Will continue to replace Levothyroxine 250 mcg for hypothyroidism. Per Hospitalist consult done  last week - pt to continue this dose and her pedal edema 2/2 to her thyroid condition - symptomatic treatment . Reviewed labs - pt was medically cleared from ED- TSH was elevated on 02/17/16 >90,000 -  lipid panel- elevated  hba1c -5.1 CSW will continue working on disposition.  Patient to participate in therapeutic milieu .  Aswad Wandrey 03/02/2016 3:24 PM

## 2016-03-02 NOTE — Plan of Care (Signed)
Problem: Medication: Goal: Compliance with prescribed medication regimen will improve Outcome: Progressing Pt has been compliant with medications tonight.    

## 2016-03-02 NOTE — Progress Notes (Signed)
Patient attended karaoke group tonight.  

## 2016-03-02 NOTE — [facility] Group Notes (Signed)
[facility] Group Notes:  (Counselor/Nursing/MHT/Case Management/Adjunct)  03/02/2016 1:15PM  Type of Therapy:  Group Therapy  Participation Level:  Active  Participation Quality:  Appropriate  Affect:  Flat  Cognitive:  Oriented  Insight:  Improving  Engagement in Group:  Limited  Engagement in Therapy:  Limited  Modes of Intervention:  Discussion, Exploration and Socialization  Summary of Progress/Problems: The topic for group was balance in life.  Pt participated in the discussion about when their life was in balance and out of balance and how this feels.  Pt discussed ways to get back in balance and short term goals they can work on to get where they want to be. Invited.  Chose to not attend.   Ida Rogue 03/02/2016 4:47 PM

## 2016-03-02 NOTE — Progress Notes (Signed)
Data. Patient denies SI/HI/AVH.   Continues to be somatic, having multiple C/O , "Issues in my body". Patient isolating in room most of shift, but was calm and appropriate. On her self inventory she reports, 3/10 for depression and hopelessness and 2/10 for anxiety. Her goal today is: "Work on Medical sales representative. Emotional support and encouragement offered. Education provided on medication, indications and side effect. Q 15 minute checks done for safety. Response. Safety on the unit maintained through 15 minute checks.  Medications taken as prescribed. Attended groups. Remained calm throughout shift.

## 2016-03-02 NOTE — Progress Notes (Signed)
Recreation Therapy Notes  03/02/16 1456:  LRT went to meet with pt but pt was asleep.   Caroll Rancher, LRT/CTRS     Caroll Rancher A 03/02/2016 3:40 PM

## 2016-03-03 DIAGNOSIS — G2571 Drug induced akathisia: Secondary | ICD-10-CM | POA: Clinically undetermined

## 2016-03-03 MED ORDER — MENTHOL 3 MG MT LOZG: 1.0000 | LOZENGE | OROMUCOSAL | Status: DC | PRN

## 2016-03-03 MED ORDER — BIOTENE DRY MOUTH MT LIQD
15.0000 mL | OROMUCOSAL | Status: DC | PRN
  Administered 2016-03-03 – 2016-03-04 (×2): 15 mL via OROMUCOSAL
  Filled 2016-03-03: qty 237

## 2016-03-03 MED ORDER — ARIPIPRAZOLE 5 MG PO TABS
5.0000 mg | ORAL_TABLET | ORAL | Status: DC
  Administered 2016-03-03 – 2016-03-06 (×6): 5 mg via ORAL
  Filled 2016-03-03 (×8): qty 1

## 2016-03-03 MED ORDER — BENZTROPINE MESYLATE 0.5 MG PO TABS
0.5000 mg | ORAL_TABLET | Freq: Two times a day (BID) | ORAL | Status: DC
  Administered 2016-03-03 (×2): 0.5 mg via ORAL
  Filled 2016-03-03 (×9): qty 1

## 2016-03-03 MED ORDER — BIOTENE DRY MOUTH MT LIQD
15.0000 mL | OROMUCOSAL | Status: DC | PRN
  Filled 2016-03-03: qty 15

## 2016-03-03 NOTE — Progress Notes (Signed)
D: Patient denies SI/HI and A/V hallucinations; patient was reporting dry mouth; rates depression 2/10; rates anxiety 2/10; and rates hopelessness 1/10  A: Monitored q 15 minutes; patient encouraged to attend groups; patient educated about medications; patient given medications per physician orders; patient encouraged to express feelings and/or concerns  R: Patient is cooperative; patient isolates to her room but will attend groups;patient affect is  patient was able to set goal to talk with staff 1:1 when having feelings of SI; patient is taking medications as prescribed and tolerating medications; patient is attending all groups

## 2016-03-03 NOTE — [facility] Group Notes (Signed)
[facility] LCSW Group Therapy  03/03/2016  1:05 PM  Type of Therapy:  Group therapy  Participation Level:  Active  Participation Quality:  Attentive  Affect:  Flat  Cognitive:  Oriented  Insight:  Limited  Engagement in Therapy:  Limited  Modes of Intervention:  Discussion, Socialization  Summary of Progress/Problems:  Chaplain was here to lead a group on themes of hope and courage. "The word 'appropriate' is what we need to include if we are trying to define a concept..  It needs to be what is helpful for me, not what someone else thinks I need.  Not a nuisance or put me in danger." Vague, rambling, evasive as usual.. [name] 03/03/2016 1:28 PM

## 2016-03-03 NOTE — Progress Notes (Signed)
[facility] MD Progress Note  03/03/2016 12:08 PM Mikalyn Hermida  MRN:  [id number]   Subjective:  "I feel restless , I do not think I can sit still long , I also have dry mouth.'         Objective:  Patient seen and chart reviewed.Discussed patient with treatment team.  Pt today seen as anxious , mostly about being restless and feeling urge to move around often. Pt reports that this has been going on all day yesterday.  Pt otherwise continues to be guarded , paranoid about sharing her personal information with staff. Pt reports sleep as fair, denies other concerns.       Principal Problem: Schizoaffective disorder, bipolar type (HCC) Diagnosis:   Patient Active Problem List   Diagnosis Date Noted  . Akathisia [R45.0] 03/03/2016  . Hypothyroidism [E03.9] 02/21/2016  . Bilateral edema of lower extremity [R60.0] 02/21/2016  . Schizoaffective disorder, bipolar type (HCC) [F25.0] 02/17/2016   Total Time spent with patient: 25 minutes  Past Psychiatric History: see HPI   Past Medical History:  Past Medical History:  Diagnosis Date  . Bipolar 1 disorder (HCC)   . Schizophrenia (HCC)   . Thyroid disease    Family History: Please see H&P.  Family Psychiatric  History: see HPI Social History: Patient is single , her mother lives in [place]. Pt is on SSD. History  Alcohol Use No     History  Drug Use No    Social History   Social History  . Marital status: Divorced    Spouse name: N/A  . Number of children: N/A  . Years of education: N/A   Social History Main Topics  . Smoking status: Former Games developer  . Smokeless tobacco: Never Used  . Alcohol use No  . Drug use: No  . Sexual activity: Not Currently   Other Topics Concern  . None   Social History Narrative  . None   Additional Social History:    Pain Medications: See PTA medication list Prescriptions: See PTA medication list.  Pt says she takes her Synthroid pretty regularly.  Could not name her psychiatric  meds. Over the Counter: See PTA medication list History of alcohol / drug use?: No history of alcohol / drug abuse Longest period of sobriety (when/how long): unknown                    Sleep: Fair  Appetite:  Fair improving  Current Medications: Current Facility-Administered Medications  Medication Dose Route Frequency Provider Last Rate Last Dose  . acetaminophen (TYLENOL) tablet 650 mg  650 mg Oral Q6H PRN Kerry Hough, PA-C   650 mg at 03/02/16 8295  . alum & mag hydroxide-simeth (MAALOX/MYLANTA) 200-200-20 MG/5ML suspension 30 mL  30 mL Oral Q4H PRN Kerry Hough, PA-C      . antiseptic oral rinse (BIOTENE) solution 15 mL  15 mL Mouth Rinse PRN Jomarie Longs, MD   15 mL at 03/03/16 1054  . ARIPiprazole (ABILIFY) tablet 5 mg  5 mg Oral BH-qamhs Ashrita Chrismer, MD      . benztropine (COGENTIN) tablet 0.5 mg  0.5 mg Oral BID Jomarie Longs, MD   0.5 mg at 03/03/16 1052  . divalproex (DEPAKOTE ER) 24 hr tablet 750 mg  750 mg Oral QHS Jomarie Longs, MD   750 mg at 03/02/16 2125  . gabapentin (NEURONTIN) capsule 200 mg  200 mg Oral BID Jomarie Longs, MD   200 mg at 03/03/16 0846  .  hydrOXYzine (ATARAX/VISTARIL) tablet 25 mg  25 mg Oral TID PRN Jomarie Longs, MD      . levothyroxine (SYNTHROID, LEVOTHROID) tablet 250 mcg  250 mcg Oral QAC breakfast Kerry Hough, PA-C   250 mcg at 03/03/16 1610  . LORazepam (ATIVAN) tablet 1 mg  1 mg Oral Q4H PRN Jomarie Longs, MD   1 mg at 03/02/16 2125   Or  . LORazepam (ATIVAN) injection 1 mg  1 mg Intramuscular Q4H PRN Elli Groesbeck, MD      . magnesium hydroxide (MILK OF MAGNESIA) suspension 30 mL  30 mL Oral Daily PRN Kerry Hough, PA-C   30 mL at 03/01/16 0952  . menthol-cetylpyridinium (CEPACOL) lozenge 3 mg  1 lozenge Oral PRN Jomarie Longs, MD      . pantoprazole (PROTONIX) EC tablet 40 mg  40 mg Oral BID AC Kerry Hough, PA-C   40 mg at 03/03/16 0603  . traZODone (DESYREL) tablet 50 mg  50 mg Oral QHS,MR X 1 Kerry Hough, PA-C   50 mg at 03/02/16 2208    Lab Results:  No results found for this or any previous visit (from the past 48 hour(s)).  Blood Alcohol level:  Lab Results  Component Value Date   ETH <5 02/27/2016   ETH <5 02/17/2016    Metabolic Disorder Labs: Lab Results  Component Value Date   HGBA1C 5.1 03/01/2016   MPG 100 03/01/2016   No results found for: PROLACTIN Lab Results  Component Value Date   CHOL 160 03/01/2016   TRIG 233 (H) 03/01/2016   HDL 27 (L) 03/01/2016   CHOLHDL 5.9 03/01/2016   VLDL 47 (H) 03/01/2016   LDLCALC 86 03/01/2016    Physical Findings: AIMS: Facial and Oral Movements Muscles of Facial Expression: None, normal Lips and Perioral Area: None, normal Jaw: None, normal Tongue: None, normal,Extremity Movements Upper (arms, wrists, hands, fingers): None, normal Lower (legs, knees, ankles, toes): None, normal, Trunk Movements Neck, shoulders, hips: None, normal, Overall Severity Severity of abnormal movements (highest score from questions above): None, normal Incapacitation due to abnormal movements: None, normal Patient's awareness of abnormal movements (rate only patient's report): No Awareness, Dental Status Current problems with teeth and/or dentures?: No Does patient usually wear dentures?: No  CIWA:    COWS:     Musculoskeletal: Strength & Muscle Tone: within normal limits Gait & Station: normal Patient leans: N/A  Psychiatric Specialty Exam: Physical Exam  Nursing note and vitals reviewed. Cardiovascular: Normal rate.   Psychiatric: Judgment and thought content normal. Her mood appears anxious. She is slowed. Cognition and memory are normal. She exhibits a depressed mood.    Review of Systems  Constitutional: Positive for malaise/fatigue.  HENT: Negative.   Eyes: Negative.   Respiratory: Negative.   Cardiovascular: Negative.   Gastrointestinal: Negative.   Genitourinary: Negative.   Musculoskeletal: Negative.   Skin:  Negative.   Neurological: Negative.   Endo/Heme/Allergies: Negative.   Psychiatric/Behavioral: Positive for depression. The patient is nervous/anxious and has insomnia.   All other systems reviewed and are negative.   Blood pressure 100/68, pulse (!) 101, temperature 98.4 F (36.9 C), temperature source Oral, resp. rate 16, height 5' 9.5" (1.765 m), weight (!) 141.5 kg (312 lb), last menstrual period 02/25/2016.Body mass index is 45.41 kg/m.  General Appearance: Fairly Groomed  Eye Contact:  Fair  Speech:  Pressured  Volume:  Normal  Mood:  Anxious  Affect:  Congruent  Thought Process:  Coherent  Orientation:  Full (Time, Place, and Person)  Thought Content:  Rumination improving  Suicidal Thoughts:  was suicidal with plan on admission- contracts for safety on the unit  Homicidal Thoughts:  No  Memory:  Immediate;   Fair Recent;   Fair Remote;   Fair  Judgement:  Fair  Insight:  Fair  Psychomotor Activity:  Normal  Concentration:  Concentration: Good and Attention Span: Good  Recall:  [employer] of Knowledge:  Fair  Language:  Fair  Akathisia:  Yes  Handed:  Right  AIMS (if indicated):     Assets:  Desire for Improvement Resilience  ADL's:  Intact  Cognition:  WNL  Sleep:  Number of Hours: 6.5     Treatment Plan Summary::Zayla Biebersteinis an 45 y.o.female caucasian female , single , is homeless , has a hx of schizoaffective disorder as well as hypothyroidism , was recently discharged from [facility] on 02/25/16 presented again with psychosis and SI with plan. Patient today presents with Akathisia likely due to increased dose of Abilify, discussed reducing the dose and adding cogentin. Will continue treatment.  Will continue Depakote ER 750 mg po qhs for mood sx. Depakote level on 03/05/16. Will reduce Abilify to 5 mg po BID for psychosis/mood sx.Dose reduced due to akathisia. Will add Cogentin 0.5 mg po bid for ADRs of abilify. Will continue Neurontin 200 mg po bid for mood  sx, anxiety sx. Will continue Trazodone 50 mg po qhs for sleep. Will make available PRN medications as per agitation protocol. Will continue to monitor vitals ,medication compliance and treatment side effects while patient is here.  Will monitor for medical issues as well as call consult as needed. Will continue to replace Levothyroxine 250 mcg for hypothyroidism. Per Hospitalist consult done last week - pt to continue this dose and her pedal edema 2/2 to her thyroid condition - symptomatic treatment . Reviewed labs - pt was medically cleared from ED- TSH was elevated on 02/17/16 >90,000 -  lipid panel- elevated  hba1c -5.1 CSW will continue working on disposition.  Patient to participate in therapeutic milieu .  Marthe Dant 03/03/2016 12:08 PM

## 2016-03-03 NOTE — Progress Notes (Signed)
Patient ID: Meghan Hensley, female   DOB: 07/05/1971, 45 y.o.   MRN: [id number] D: Patient observed watching TV and interacting well with peers. Pt reports relieve from biotin. Pt reports arm tingling from  Cogentin and refuses to take it again.   Denies  SI/HI/AVH and pain.No behavioral issues noted.  A: Support and encouragement offered as needed. Medications administered as prescribed.  R: Patient safe on unit. Pt attended evening wrap up group. Will continue to monitor patient for safety and stability.

## 2016-03-03 NOTE — Plan of Care (Signed)
Problem: Activity: Goal: Sleeping patterns will improve Outcome: Progressing Pt slept 6.5 hours last night according to flowsheet.    

## 2016-03-03 NOTE — Progress Notes (Signed)
Patient ID: Meghan Hensley, female   DOB: November 27, 1970, 45 y.o.   MRN: [id number] PER STATE REGULATIONS 482.30  THIS CHART WAS REVIEWED FOR MEDICAL NECESSITY WITH RESPECT TO THE PATIENT'S ADMISSION/ DURATION OF STAY.  NEXT REVIEW DATE: 03/07/2016  Willa Rough, RN, BSN CASE MANAGER

## 2016-03-03 NOTE — Progress Notes (Signed)
Recreation Therapy Notes  03/03/16 1155:  LRT spoke with pt about coping skills.  Pt stated she uses food as a Associate Professor.  LRT encouraged pt to think of things she likes to do that can be used as a better coping skill than food.  Pt expressed that she gets joy out of "watching others engage in sports or dance".  Pt did express that sometimes she will participate in sports.  Pt expressed an interest in word searches and crossword puzzles.  LRT told pt that she would print her out some crossword puzzles and word searches but also give her a worksheet to fill out to help her identify positive coping skills.  LRT will follow up with pt.  Caroll Rancher, LRT/CTRS    Caroll Rancher A 03/03/2016 12:34 PM

## 2016-03-03 NOTE — Progress Notes (Signed)
D: Pt was in the day room upon initial approach.  Pt presents with anxious affect and mood.  She reports she had a "pretty good day."  Pt complains of "restlessness" and "dry mouth."  Pt reports her goal is "working on trying to get my discharge."  Pt reports "I want to make sure I have the community resources lined up."  Denies SI/HI, denies hallucinations, denies pain.  Pt has been visible in milieu interacting appropriately with peers and staff.  She attended evening group.    A: Actively listened to pt and offered support and encouragement.  Medication administered per order.  PRN medication administered for anxiety.    R: Pt is compliant with medications.  She verbally contracts for safety and reports she will inform staff of needs and concerns.  Will continue to monitor and assess.

## 2016-03-03 NOTE — Plan of Care (Signed)
Problem: Medication: Goal: Compliance with prescribed medication regimen will improve Outcome: Progressing Pt compliant with medication regime   

## 2016-03-03 NOTE — Progress Notes (Signed)
[facility] Group Notes:  (Nursing/MHT/Case Management/Adjunct)  Date:  03/03/2016  Time:  9:29 PM  Type of Therapy:  Psychoeducational Skills  Participation Level:  Active  Participation Quality:  Appropriate  Affect:  Anxious  Cognitive:  Appropriate  Insight:  Appropriate  Engagement in Group:  Off Topic  Modes of Intervention:  Education  Summary of Progress/Problems: The patient stated in group that she enjoyed the groups today, in particular, the group that discussed caring for one another. She states that she is feeling better and that she has had a number of physical issues but she feels that they have been resolved.   Meghan Hensley S 03/03/2016, 9:29 PM

## 2016-03-04 NOTE — Progress Notes (Signed)
[facility] Group Notes:  (Nursing/MHT/Case Management/Adjunct)  Date:  03/04/2016  Time:  9:50 PM  Type of Therapy:  Psychoeducational Skills  Participation Level:  Active  Participation Quality:  Monopolizing  Affect:  Appropriate  Cognitive:  Appropriate  Insight:  Good  Engagement in Group:  Developing/Improving  Modes of Intervention:  Education  Summary of Progress/Problems: The patient shared in group that she had a good day because she was able to go to the gym to play ball. She states that she accomplished her goal which was to play with other and not be as afraid. She admits to having difficulty interacting with men but states that she overcame that today. As a theme for the day, her coping skill will be to watch more television.   Jahlon Baines S 03/04/2016, 9:50 PM

## 2016-03-04 NOTE — [facility] Group Notes (Signed)
[facility] Group Notes:  (Clinical Social Work)  03/04/2016  11:15-12:00PM  Summary of Progress/Problems:   Today's process group involved patients discussing their feelings related to being hospitalized, as well as how they can use their present feelings to create a goal for the day. The patient expressed her primary feeling about being hospitalized is "I'm usually hopeful, I usually know being in the hospital is short-term.  I am having as close as possible to a good time.  I want positive reinforcement from my environment."  She was tangential and long-winded but not monopolizing like in previous times in group.  Type of Therapy:  Group Therapy - Process  Participation Level:  Active  Participation Quality:  Attentive and Sharing  Affect:  Excited  Cognitive:  Appropriate  Insight:  Developing/Improving  Engagement in Therapy:  Developing/Improving  Modes of Intervention:  Exploration, Discussion  Ambrose Mantle, LCSW 03/04/2016, 4:51 PM

## 2016-03-04 NOTE — Progress Notes (Addendum)
Patient has been up and active on the unit, attended group this evening and has voiced no complaints. She made reference to her cogentin being stopped and feels good about taking her hs medications. The cogentin was not discontinued but she refused it today.She reports that once discharged she will be moving and she has an appointment with a resource center by the name of [facility]. Patient currently denies having pain, -si/hi/a/v hall. Support and encouragement offered, safety maintained on unit, will continue to monitor.

## 2016-03-04 NOTE — Progress Notes (Signed)
DAR Note: Meghan Hensley has been up and visible on the unit.  She has attended group and interacted well with staff and peers.  She stated that she is feeling pretty good today but is worried that her Prozac is causing her to feel like she is "flying high."  She is worried about taking the cogentin because she has been having some tingling in her arms and refused to take this mornings dose.  She denies SI/HI or A/V hallucinations.  She completed her self inventory and reports that her depression is 2/10 and her hopelessness and anxiety are 1/10.  She states that her goal for today is to "work on my discharge" and she will accomplish this goal by "meeting with nurse/doctor."  Encouraged continued participation in group and unit activities.  Q 15 minute checks maintained for safety.  We will continue to monitor the progress towards her goals.

## 2016-03-04 NOTE — [facility] Group Notes (Signed)
[facility] Group Notes:  (Nursing/MHT/Case Management/Adjunct)  Date:  03/04/2016  Time:  1030am  Type of Therapy:  Nurse Education  Participation Level:  Active  Participation Quality:  Appropriate and Sharing  Affect:  Blunted  Cognitive:  Alert and Appropriate  Insight:  Improving  Engagement in Group:  Engaged and Off Topic  Modes of Intervention:  Discussion and Education  Summary of Progress/Problems:  Today's topic was Healthy Coping skills.  Discussed goal setting and sleep hygiene.  Janae was able to share that her goal for today was "to adjust to a medication change and hope to leave soon."  She didn't talk about her coping skills but did share a story from her childhood where she learned that she was going to have to cope with things as they come up and "expect the unexpected."    Levin Bacon 03/04/2016, 11:40 AM

## 2016-03-04 NOTE — Progress Notes (Signed)
[facility] MD Progress Note  03/04/2016 4:38 PM Meghan Hensley  MRN:  [id number] Subjective:  Patient reports " I am refusing cogentin and Prozac because I have a flying high feeling."  Objective:  Meghan Hensley is awake, alert and oriented X4. Seen resting in the dayroom interacting with staff and others.  Denies suicidal or homicidal ideation. Denies auditory or visual hallucination and does not appear to be responding to internal stimuli. Patient reports she is medication compliant without mediation side effects. Patient is ruminative with medication and medical history.  States her depression 2/10. Patient states "I am not feeling myself today, after taken cogentin."  Reports good appetite other wise and resting well. Support, encouragement and reassurance was provided.    Principal Problem: Schizoaffective disorder, bipolar type (HCC) Diagnosis:   Patient Active Problem List   Diagnosis Date Noted  . Akathisia [R45.0] 03/03/2016  . Hypothyroidism [E03.9] 02/21/2016  . Bilateral edema of lower extremity [R60.0] 02/21/2016  . Schizoaffective disorder, bipolar type (HCC) [F25.0] 02/17/2016   Total Time spent with patient: 30 minutes  Past Psychiatric History: See above  Past Medical History:  Past Medical History:  Diagnosis Date  . Bipolar 1 disorder (HCC)   . Schizophrenia (HCC)   . Thyroid disease    History reviewed. No pertinent surgical history. Family History:  Family History  Problem Relation Age of Onset  . Cancer Father   . Mental illness Neg Hx    Family Psychiatric  History: See Above Social History:  History  Alcohol Use No     History  Drug Use No    Social History   Social History  . Marital status: Divorced    Spouse name: N/A  . Number of children: N/A  . Years of education: N/A   Social History Main Topics  . Smoking status: Former Games developer  . Smokeless tobacco: Never Used  . Alcohol use No  . Drug use: No  . Sexual activity: Not Currently    Other Topics Concern  . None   Social History Narrative  . None   Additional Social History:    Pain Medications: See PTA medication list Prescriptions: See PTA medication list.  Pt says she takes her Synthroid pretty regularly.  Could not name her psychiatric meds. Over the Counter: See PTA medication list History of alcohol / drug use?: No history of alcohol / drug abuse Longest period of sobriety (when/how long): unknown                    Sleep: Good  Appetite:  Fair  Current Medications: Current Facility-Administered Medications  Medication Dose Route Frequency Provider Last Rate Last Dose  . acetaminophen (TYLENOL) tablet 650 mg  650 mg Oral Q6H PRN Kerry Hough, PA-C   650 mg at 03/02/16 2841  . alum & mag hydroxide-simeth (MAALOX/MYLANTA) 200-200-20 MG/5ML suspension 30 mL  30 mL Oral Q4H PRN Kerry Hough, PA-C      . antiseptic oral rinse (BIOTENE) solution 15 mL  15 mL Mouth Rinse PRN Jomarie Longs, MD   15 mL at 03/04/16 3244  . ARIPiprazole (ABILIFY) tablet 5 mg  5 mg Oral BH-qamhs Jomarie Longs, MD   5 mg at 03/04/16 0816  . benztropine (COGENTIN) tablet 0.5 mg  0.5 mg Oral BID Jomarie Longs, MD   0.5 mg at 03/03/16 1715  . divalproex (DEPAKOTE ER) 24 hr tablet 750 mg  750 mg Oral QHS Saramma Eappen, MD   750 mg at  03/03/16 2108  . gabapentin (NEURONTIN) capsule 200 mg  200 mg Oral BID Jomarie Longs, MD   200 mg at 03/04/16 0816  . hydrOXYzine (ATARAX/VISTARIL) tablet 25 mg  25 mg Oral TID PRN Jomarie Longs, MD      . levothyroxine (SYNTHROID, LEVOTHROID) tablet 250 mcg  250 mcg Oral QAC breakfast Kerry Hough, PA-C   250 mcg at 03/04/16 8381  . LORazepam (ATIVAN) tablet 1 mg  1 mg Oral Q4H PRN Jomarie Longs, MD   1 mg at 03/02/16 2125   Or  . LORazepam (ATIVAN) injection 1 mg  1 mg Intramuscular Q4H PRN Saramma Eappen, MD      . magnesium hydroxide (MILK OF MAGNESIA) suspension 30 mL  30 mL Oral Daily PRN Kerry Hough, PA-C   30 mL at  03/01/16 0952  . menthol-cetylpyridinium (CEPACOL) lozenge 3 mg  1 lozenge Oral PRN Jomarie Longs, MD      . pantoprazole (PROTONIX) EC tablet 40 mg  40 mg Oral BID AC Kerry Hough, PA-C   40 mg at 03/04/16 8403  . traZODone (DESYREL) tablet 50 mg  50 mg Oral QHS,MR X 1 Kerry Hough, PA-C   50 mg at 03/03/16 2108    Lab Results: No results found for this or any previous visit (from the past 48 hour(s)).  Blood Alcohol level:  Lab Results  Component Value Date   ETH <5 02/27/2016   ETH <5 02/17/2016    Metabolic Disorder Labs: Lab Results  Component Value Date   HGBA1C 5.1 03/01/2016   MPG 100 03/01/2016   No results found for: PROLACTIN Lab Results  Component Value Date   CHOL 160 03/01/2016   TRIG 233 (H) 03/01/2016   HDL 27 (L) 03/01/2016   CHOLHDL 5.9 03/01/2016   VLDL 47 (H) 03/01/2016   LDLCALC 86 03/01/2016    Physical Findings: AIMS: Facial and Oral Movements Muscles of Facial Expression: None, normal Lips and Perioral Area: None, normal Jaw: None, normal Tongue: None, normal,Extremity Movements Upper (arms, wrists, hands, fingers): None, normal Lower (legs, knees, ankles, toes): None, normal, Trunk Movements Neck, shoulders, hips: None, normal, Overall Severity Severity of abnormal movements (highest score from questions above): None, normal Incapacitation due to abnormal movements: None, normal Patient's awareness of abnormal movements (rate only patient's report): No Awareness, Dental Status Current problems with teeth and/or dentures?: No Does patient usually wear dentures?: No  CIWA:    COWS:     Musculoskeletal: Strength & Muscle Tone: within normal limits Gait & Station: normal Patient leans: N/A  Psychiatric Specialty Exam: Physical Exam  Review of Systems  Cardiovascular: Positive for leg swelling.  Psychiatric/Behavioral: Positive for depression. The patient is nervous/anxious.     Blood pressure 108/73, pulse 88, temperature 98.8 F  (37.1 C), temperature source Oral, resp. rate 18, height 5' 9.5" (1.765 m), weight (!) 141.5 kg (312 lb), last menstrual period 02/25/2016.Body mass index is 45.41 kg/m.  General Appearance: Bizarre and Casual  Eye Contact:  Fair  Speech:  Clear and Coherent and Pressured  Volume:  Normal  Mood:  Anxious  Affect:  Congruent  Thought Process:  Linear  Orientation:  Full (Time, Place, and Person)  Thought Content:  Hallucinations: None  Suicidal Thoughts:  No  Homicidal Thoughts:  No  Memory:  Immediate;   Fair Recent;   Fair Remote;   Fair  Judgement:  Fair  Insight:  Present  Psychomotor Activity:  Restlessness  Concentration:  Concentration: Fair  Recall:  Jennelle Human of Knowledge:  Fair  Language:  Fair  Akathisia:  No  Handed:  Right  AIMS (if indicated):     Assets:  Desire for Improvement Housing Intimacy Resilience Social Support  ADL's:  Intact  Cognition:  WNL  Sleep:  Number of Hours: 4     I agree with current treatment plan on 03/04/2016, Patient seen face-to-face for psychiatric evaluation follow-up, chart reviewed. Reviewed the information documented and agree with the treatment plan.  Treatment Plan Summary: Daily contact with patient to assess and evaluate symptoms and progress in treatment and Medication management   Will continue Depakote ER 750 mg po qhs for mood sx. Depakote level on 03/05/16. Will reduce Abilify to 5 mg po BID for psychosis/mood sx.Dose reduced due to akathisia. Will add Cogentin 0.5 mg po bid for ADRs of abilify. Will continue Neurontin 200 mg po bid for mood sx, anxiety sx. Will continue Trazodone 50 mg po qhs for sleep. Will make available PRN medications as per agitation protocol. Will continue to monitor vitals ,medication compliance and treatment side effects while patient is here.  Will monitor for medical issues as well as call consult as needed. Will continue to replace Levothyroxine 250 mcg for hypothyroidism. Per  Hospitalist consult done last week - pt to continue this dose and her pedal edema 2/2 to her thyroid condition - symptomatic treatment . Reviewed labs - pt was medically cleared from ED- TSH was elevated on 02/17/16 >90,000 -  lipid panel- elevated  hba1c -5.1 CSW will continue working on disposition. Patient to participate in therapeutic milieu .  Oneta Rack, NP 03/04/2016, 4:38 PM  I agree with findings and treatment plan of this patient

## 2016-03-05 LAB — VALPROIC ACID LEVEL: VALPROIC ACID LVL: 36 ug/mL — AB (ref 50.0–100.0)

## 2016-03-05 MED ORDER — TRIHEXYPHENIDYL HCL 2 MG PO TABS
2.0000 mg | ORAL_TABLET | Freq: Every day | ORAL | Status: DC
  Administered 2016-03-05: 2 mg via ORAL
  Filled 2016-03-05 (×2): qty 1

## 2016-03-05 MED ORDER — TRIHEXYPHENIDYL HCL 2 MG PO TABS: 2.0000 mg | ORAL_TABLET | Freq: Two times a day (BID) | ORAL | Status: DC

## 2016-03-05 NOTE — [facility] Group Notes (Addendum)
[facility] Group Notes:  (Clinical Social Work)  03/05/2016  11:00AM-12:00PM  Summary of Progress/Problems:  The main focus of today's process group was to listen to a variety of genres of music and to identify that different types of music provoke different responses.  The patient then was able to identify personally what was soothing for them, as well as energizing, as well as how patient can personally use this knowledge in sleep habits, with depression, and with other symptoms.  The patient expressed at the beginning of group the overall feeling of good, as the first part of an answer which rambled and could not be followed.  She listened to all of the music smiling and reacting positively, did not often speak up when asked how the music made patients feel, but obviously enjoyed most of it.  Type of Therapy:  Music Therapy   Participation Level:  Active  Participation Quality:  Attentive   Affect:  Blunted  Cognitive:  Disorganized  Insight:  Improving  Engagement in Therapy:  Engaged  Modes of Intervention:   Activity, Exploration  Ambrose MantleMareida Grossman-Orr, LCSW 03/05/2016

## 2016-03-05 NOTE — Progress Notes (Signed)
AM      [] Hide copied text [] Hover for attribution information D: Patient's self inventory sheet: patient has good sleep, recieved sleep medication.good  Appetite, normal energy level, poor concentration. Rated depression 3/10, hopeless 2/10, anxiety 1/10. SI/HI/AVH: denies. Physical complaints are denied. Goal is "concentration/side effects of medicines are poor and would like to address this as much as possible, also, discharge". Plans to work on "meet with counselors, etc".   A: Medications administered, assessed medication knowledge and education given on medication regimen.  Emotional support and encouragement given patient. R: denies SI and HI , contracts for safety. Safety maintained with 15 minute checks.

## 2016-03-05 NOTE — Progress Notes (Signed)
[facility] Group Notes:  (Nursing/MHT/Case Management/Adjunct)  Date:  03/05/2016  Time:  9:27 PM  Type of Therapy:  Psychoeducational Skills  Participation Level:  Active  Participation Quality:  Appropriate  Affect:  Appropriate  Cognitive:  Appropriate  Insight:  Good  Engagement in Group:  Engaged  Modes of Intervention:  Education  Summary of Progress/Problems: The patient shared in group this evening that she enjoyed the morning music group. She explained that the group helped to her to feel better since the music helped her to focus on herself more. The patient also mentioned that she enjoyed going to the gym for recreation. As for the theme of the day, her support system will consist of herself.  Hazle CocaGOODMAN, Depaul Arizpe S 03/05/2016, 9:27 PM

## 2016-03-05 NOTE — Progress Notes (Signed)
[facility] MD Progress Note  03/05/2016 1:39 PM Meghan Hensley  MRN:  [id number]   Subjective:  Patient reports continued to be ruminative regarding medications. states the cogentin and Prozac gives her "flying high feeling."  Objective:  Meghan Hensley is awake, alert and oriented X4. Seen resting in the dayroom interacting with staff and others. Patient has a blunted affect. Patient reports she is having a good day. Denies suicidal or homicidal ideation. Denies auditory or visual hallucination and does not appear to be responding to internal stimuli. Patient reports she is medication compliant without mediation side effects. Patient is ruminative with medication and medical history. States her depression 2/10. Reports good appetite other wise and resting well. Support, encouragement and reassurance was provided.    Principal Problem: Schizoaffective disorder, bipolar type (HCC) Diagnosis:   Patient Active Problem List   Diagnosis Date Noted  . Akathisia [R45.0] 03/03/2016  . Hypothyroidism [E03.9] 02/21/2016  . Bilateral edema of lower extremity [R60.0] 02/21/2016  . Schizoaffective disorder, bipolar type (HCC) [F25.0] 02/17/2016   Total Time spent with patient: 30 minutes  Past Psychiatric History: See above  Past Medical History:  Past Medical History:  Diagnosis Date  . Bipolar 1 disorder (HCC)   . Schizophrenia (HCC)   . Thyroid disease    History reviewed. No pertinent surgical history. Family History:  Family History  Problem Relation Age of Onset  . Cancer Father   . Mental illness Neg Hx    Family Psychiatric  History: See Above Social History:  History  Alcohol Use No     History  Drug Use No    Social History   Social History  . Marital status: Divorced    Spouse name: N/A  . Number of children: N/A  . Years of education: N/A   Social History Main Topics  . Smoking status: Former Games developer  . Smokeless tobacco: Never Used  . Alcohol use No  . Drug use: No   . Sexual activity: Not Currently   Other Topics Concern  . None   Social History Narrative  . None   Additional Social History:    Pain Medications: See PTA medication list Prescriptions: See PTA medication list.  Pt says she takes her Synthroid pretty regularly.  Could not name her psychiatric meds. Over the Counter: See PTA medication list History of alcohol / drug use?: No history of alcohol / drug abuse Longest period of sobriety (when/how long): unknown                    Sleep: Good  Appetite:  Fair  Current Medications: Current Facility-Administered Medications  Medication Dose Route Frequency Provider Last Rate Last Dose  . acetaminophen (TYLENOL) tablet 650 mg  650 mg Oral Q6H PRN Kerry Hough, PA-C   650 mg at 03/02/16 8295  . alum & mag hydroxide-simeth (MAALOX/MYLANTA) 200-200-20 MG/5ML suspension 30 mL  30 mL Oral Q4H PRN Kerry Hough, PA-C      . antiseptic oral rinse (BIOTENE) solution 15 mL  15 mL Mouth Rinse PRN Jomarie Longs, MD   15 mL at 03/04/16 6213  . ARIPiprazole (ABILIFY) tablet 5 mg  5 mg Oral BH-qamhs Saramma Eappen, MD   5 mg at 03/05/16 0800  . benztropine (COGENTIN) tablet 0.5 mg  0.5 mg Oral BID Jomarie Longs, MD   0.5 mg at 03/03/16 1715  . divalproex (DEPAKOTE ER) 24 hr tablet 750 mg  750 mg Oral QHS Jomarie Longs, MD  750 mg at 03/04/16 2140  . gabapentin (NEURONTIN) capsule 200 mg  200 mg Oral BID Jomarie Longs, MD   200 mg at 03/05/16 0759  . hydrOXYzine (ATARAX/VISTARIL) tablet 25 mg  25 mg Oral TID PRN Jomarie Longs, MD      . levothyroxine (SYNTHROID, LEVOTHROID) tablet 250 mcg  250 mcg Oral QAC breakfast Kerry Hough, PA-C   250 mcg at 03/05/16 0600  . LORazepam (ATIVAN) tablet 1 mg  1 mg Oral Q4H PRN Jomarie Longs, MD   1 mg at 03/02/16 2125   Or  . LORazepam (ATIVAN) injection 1 mg  1 mg Intramuscular Q4H PRN Saramma Eappen, MD      . magnesium hydroxide (MILK OF MAGNESIA) suspension 30 mL  30 mL Oral Daily PRN  Kerry Hough, PA-C   30 mL at 03/01/16 0952  . menthol-cetylpyridinium (CEPACOL) lozenge 3 mg  1 lozenge Oral PRN Jomarie Longs, MD      . pantoprazole (PROTONIX) EC tablet 40 mg  40 mg Oral BID AC Spencer E Simon, PA-C   40 mg at 03/05/16 0600  . traZODone (DESYREL) tablet 50 mg  50 mg Oral QHS,MR X 1 Kerry Hough, PA-C   50 mg at 03/04/16 2252    Lab Results:  Results for orders placed or performed during the hospital encounter of 02/28/16 (from the past 48 hour(s))  Valproic acid level     Status: Abnormal   Collection Time: 03/05/16  6:10 AM  Result Value Ref Range   Valproic Acid Lvl 36 (L) 50.0 - 100.0 ug/mL    Comment: Performed at [facility]    Blood Alcohol level:  Lab Results  Component Value Date   [facility] <5 02/27/2016   ETH <5 02/17/2016    Metabolic Disorder Labs: Lab Results  Component Value Date   HGBA1C 5.1 03/01/2016   MPG 100 03/01/2016   No results found for: PROLACTIN Lab Results  Component Value Date   CHOL 160 03/01/2016   TRIG 233 (H) 03/01/2016   HDL 27 (L) 03/01/2016   CHOLHDL 5.9 03/01/2016   VLDL 47 (H) 03/01/2016   LDLCALC 86 03/01/2016    Physical Findings: AIMS: Facial and Oral Movements Muscles of Facial Expression: None, normal Lips and Perioral Area: None, normal Jaw: None, normal Tongue: None, normal,Extremity Movements Upper (arms, wrists, hands, fingers): None, normal Lower (legs, knees, ankles, toes): None, normal, Trunk Movements Neck, shoulders, hips: None, normal, Overall Severity Severity of abnormal movements (highest score from questions above): None, normal Incapacitation due to abnormal movements: None, normal Patient's awareness of abnormal movements (rate only patient's report): No Awareness, Dental Status Current problems with teeth and/or dentures?: No Does patient usually wear dentures?: No  CIWA:    COWS:     Musculoskeletal: Strength & Muscle Tone: within normal limits Gait & Station:  normal Patient leans: N/A  Psychiatric Specialty Exam: Physical Exam  Review of Systems  Cardiovascular: Positive for leg swelling.  Psychiatric/Behavioral: Positive for depression. The patient is nervous/anxious.     Blood pressure 97/82, pulse 90, temperature 97.6 F (36.4 C), temperature source Oral, resp. rate 18, height 5' 9.5" (1.765 m), weight (!) 141.5 kg (312 lb), last menstrual period 02/25/2016.Body mass index is 45.41 kg/m.  General Appearance: Bizarre and Casual  Eye Contact:  Fair  Speech:  Clear and Coherent and Pressured  Volume:  Normal  Mood:  Anxious  Affect:  Congruent  Thought Process:  Linear  Orientation:  Full (  Time, Place, and Person)  Thought Content:  Hallucinations: None  Suicidal Thoughts:  No  Homicidal Thoughts:  No  Memory:  Immediate;   Fair Recent;   Fair Remote;   Fair  Judgement:  Fair  Insight:  Present  Psychomotor Activity:  Restlessness  Concentration:  Concentration: Fair  Recall:  FiservFair  Fund of Knowledge:  Fair  Language:  Fair  Akathisia:  No  Handed:  Right  AIMS (if indicated):     Assets:  Desire for Improvement Housing Intimacy Resilience Social Support  ADL's:  Intact  Cognition:  WNL  Sleep:  Number of Hours: 4.25     I agree with current treatment plan on 03/05/2016, Patient seen face-to-face for psychiatric evaluation follow-up, chart reviewed. Reviewed the information documented and agree with the treatment plan.  Treatment Plan Summary: Daily contact with patient to assess and evaluate symptoms and progress in treatment and Medication management   Will continue Depakote ER 750 mg po qhs for mood sx. Depakote level on 03/05/16. Will continue Abilify to 5 mg po BID for psychosis/mood sx.Dose reduced due to akathisia. Will discontinue  Cogentin 0.5 mg po bid for ADRs of abilify.- refusing  Start Artane 2 mg PO QHS for ADRs/ EPS. Will continue Neurontin 200 mg po bid for mood sx, anxiety sx. Will continue Trazodone  50 mg po qhs for sleep. Will make available PRN medications as per agitation protocol. Will continue to monitor vitals ,medication compliance and treatment side effects while patient is here.  Will monitor for medical issues as well as call consult as needed. Will continue to replace Levothyroxine 250 mcg for hypothyroidism. Per Hospitalist consult done last week - pt to continue this dose and her pedal edema 2/2 to her thyroid condition - symptomatic treatment . Reviewed labs - pt was medically cleared from ED- TSH was elevated on 02/17/16 >90,000 -  lipid panel- elevated  hba1c -5.1 CSW will continue working on disposition. Patient to participate in therapeutic milieu .  Oneta Rackanika N Lewis, NP 03/05/2016, 1:39 PM  I agree with findings and treatment plan of this patient

## 2016-03-05 NOTE — Progress Notes (Signed)
Writer spoke with patient 1:1 and she reports having had a good day and feels less depressed. She reports that the medications seem to keep her feeling even and not up and down like she normally feels. She has been up ion the dayroom watching tv and plans on attending group this evening. She is pleasant and calm. Support given and safety maintained on unit with 15 min checks.

## 2016-03-06 MED ORDER — DIVALPROEX SODIUM ER 500 MG PO TB24
1000.0000 mg | ORAL_TABLET | Freq: Every day | ORAL | Status: DC
  Filled 2016-03-06: qty 2

## 2016-03-06 MED ORDER — GABAPENTIN 100 MG PO CAPS: 200.0000 mg | ORAL_CAPSULE | Freq: Two times a day (BID) | ORAL | 0 refills | Status: DC

## 2016-03-06 MED ORDER — TRIHEXYPHENIDYL HCL 2 MG PO TABS: 2.0000 mg | ORAL_TABLET | Freq: Every day | ORAL | 0 refills | Status: DC

## 2016-03-06 MED ORDER — LEVOTHYROXINE SODIUM 125 MCG PO TABS: 250.0000 ug | ORAL_TABLET | Freq: Every day | ORAL | 0 refills | Status: DC

## 2016-03-06 MED ORDER — ARIPIPRAZOLE 5 MG PO TABS: 5.0000 mg | ORAL_TABLET | ORAL | 0 refills | Status: DC

## 2016-03-06 MED ORDER — BIOTENE DRY MOUTH MT LIQD: 15.0000 mL | OROMUCOSAL | Status: DC | PRN

## 2016-03-06 MED ORDER — DIVALPROEX SODIUM ER 500 MG PO TB24: 1000.0000 mg | ORAL_TABLET | Freq: Every day | ORAL | 0 refills | Status: DC

## 2016-03-06 MED ORDER — TRAZODONE HCL 50 MG PO TABS: 50.0000 mg | ORAL_TABLET | Freq: Every evening | ORAL | 0 refills | Status: DC | PRN

## 2016-03-06 NOTE — [facility] Suicide Risk Assessment (Addendum)
[facility]   Principal Problem: Schizoaffective disorder, bipolar type [facility](HCC) Discharge Diagnoses:  Patient Active Problem List   Diagnosis Date Noted  . Akathisia [R45.0] 03/03/2016  . Hypothyroidism [E03.9] 02/21/2016  . Bilateral edema of lower extremity [R60.0] 02/21/2016  . Schizoaffective disorder, bipolar type (HCC) [F25.0] 02/17/2016    Total Time spent with patient: 30 minutes  Musculoskeletal: Strength & Muscle Tone: within normal limits Gait & Station: normal Patient leans: N/A  Psychiatric Specialty Exam: Review of Systems  Psychiatric/Behavioral: Negative for depression, hallucinations and suicidal ideas.  All other systems reviewed and are negative.   Blood pressure (!) 100/59, pulse (!) 101, temperature 97.7 F (36.5 C), temperature source Oral, resp. rate 18, height 5' 9.5" (1.765 m), weight (!) 141.5 kg (312 lb), last menstrual period 02/25/2016.Body mass index is 45.41 kg/m.  General Appearance: Fairly Groomed  Patent attorneyye Contact::  Fair  Speech:  Clear and Coherent409  Volume:  Normal  Mood:  Euthymic  Affect:  Appropriate  Thought Process:  Goal Directed and Descriptions of Associations: Circumstantial  Orientation:  Full (Time, Place, and Person)  Thought Content:  Logical  Suicidal Thoughts:  No  Homicidal Thoughts:  No  Memory:  Immediate;   Fair Recent;   Fair Remote;   Fair  Judgement:  Fair  Insight:  Fair  Psychomotor Activity:  Normal  Concentration:  Fair  Recall:  [employer]Fair  Fund of Knowledge:Fair  Language: Fair  Akathisia:  No  Handed:  Right  AIMS (if indicated):     Assets:  Desire for Improvement  Sleep:  Number of Hours: 5.5  Cognition: WNL  ADL's:  Intact   Mental Status Per Nursing Assessment::   On Admission:  Self-harm thoughts  Demographic Factors:  Caucasian  Loss Factors: NA  Historical Factors: Impulsivity  Risk Reduction Factors:   Positive therapeutic relationship  Continued Clinical  Symptoms:  Bipolar Disorder:improved  Cognitive Features That Contribute To Risk:  Polarized thinking    Suicide Risk:  Minimal: No identifiable suicidal ideation.  Patients presenting with no risk factors but with morbid ruminations; may be classified as minimal risk based on the severity of the depressive symptoms    Plan Of Care/Follow-up recommendations:  Activity:  no restrictions Diet:  regular Tests:  as needed- follow up with PMD for thyroid disease, will need follow up TSH . Other:  Depakote level in 5 days  Davied Nocito, MD 03/06/2016, 9:38 AM

## 2016-03-06 NOTE — Discharge Summary (Signed)
Physician Discharge Summary Note  Patient:  Meghan Hensley is an 45 y.o., female MRN:  [id number] DOB:  23-Jul-1971 Patient phone:  [phone] (home)  Patient address:   [address] [place] [address],  Total Time spent with patient: 30 minutes  Date of Admission:  02/28/2016 Date of Discharge:  03/06/2016  Reason for Admission:Per H&P-Meghan Hensley an 46 y.o.female caucasian female , single , is homeless , has a hx of schizoaffective disorder as well as hypothyroidism , was recently discharged from [facility] on 02/25/16. Pt per EHR notes - presented to [facility] as well as [facility]  Several times after that with somatic complaints. Pt had CT scan done in ED which was negative. Pt was medically cleared by EDP. However , pt reported suicidal thoughts with plan to drown self and hence was admitted to [facility]. Patient seen and chart reviewed today .Discussed patient with treatment team. Patient today seen with pressured speech , tangential thought process and appears delusional. Pt reports she is paranoid about every body including Clinical research associate and believes that we are all out to get her. Pt reports she felt she was having a stroke and hence was medically evaluated at the ED and had CT scan of her brain done. Pt reports she is 'partially suicidal" and had reported plan to drown self. Pt reports she is a very religious person and was seeing hearing angels . Pt with loose thought process and flight of ideas and appears to have somatic delusions. Pt reports she were noncompliant on her medications after discharge.Pt denies abusing any substances.  Principal Problem: Schizoaffective disorder, bipolar type [facility]) Discharge Diagnoses: Patient Active Problem List   Diagnosis Date Noted  . Akathisia [R45.0] 03/03/2016  . Hypothyroidism [E03.9] 02/21/2016  . Bilateral edema of lower extremity [R60.0] 02/21/2016  . Schizoaffective disorder, bipolar type (HCC) [F25.0] 02/17/2016    Past Psychiatric History: See  Above Past Medical History:  Past Medical History:  Diagnosis Date  . Bipolar 1 disorder (HCC)   . Schizophrenia (HCC)   . Thyroid disease    History reviewed. No pertinent surgical history. Family History:  Family History  Problem Relation Age of Onset  . Cancer Father   . Mental illness Neg Hx    Family Psychiatric  History: See Above Social History:  History  Alcohol Use No     History  Drug Use No    Social History   Social History  . Marital status: Divorced    Spouse name: N/A  . Number of children: N/A  . Years of education: N/A   Social History Main Topics  . Smoking status: Former Games developer  . Smokeless tobacco: Never Used  . Alcohol use No  . Drug use: No  . Sexual activity: Not Currently   Other Topics Concern  . None   Social History Narrative  . None    Hospital Course:  Meghan Hensley was admitted for Schizoaffective disorder, bipolar type [facility]) and crisis management.  Pt was treated discharged with the medications listed below under Medication List.  Medical problems were identified and treated as needed.  Home medications were restarted as appropriate.  Improvement was monitored by observation and Meghan Hensley 's daily report of symptom reduction.  Emotional and mental status was monitored by daily self-inventory reports completed by Meghan Hensley and clinical staff.         Meghan Hensley was evaluated by the treatment team for stability and plans for continued recovery upon discharge. Meghan Hensley 's motivation  was an integral factor for scheduling further treatment. Employment, transportation, bed availability, health status, family support, and any pending legal issues were also considered during hospital stay. Pt was offered further treatment options upon discharge including but not limited to Residential, Intensive Outpatient, and Outpatient treatment.  Meghan Hensley will follow up with the services as listed below under  Follow Up Information.     Upon completion of this admission the patient was both mentally and medically stable for discharge denying suicidal/homicidal ideation, auditory/visual/tactile hallucinations, delusional thoughts and paranoia.    Meghan Hensley responded well to treatment with Abilify 5 mg PO BID, Depakote ER  QHS and Neurontin 200 mg  PO BID without adverse effects. Pt demonstrated improvement without reported or observed adverse effects to the point of stability appropriate for outpatient management. Pertinent labs include: Valproic Acid 36 low, Lipid Panel, BMP and CBC for which outpatient follow-up is necessary for lab recheck as mentioned below. Reviewed CBC, CMP, BAL, and UDS; all unremarkable aside from noted exceptions.   Physical Findings: AIMS: Facial and Oral Movements Muscles of Facial Expression: None, normal Lips and Perioral Area: None, normal Jaw: None, normal Tongue: None, normal,Extremity Movements Upper (arms, wrists, hands, fingers): None, normal Lower (legs, knees, ankles, toes): None, normal, Trunk Movements Neck, shoulders, hips: None, normal, Overall Severity Severity of abnormal movements (highest score from questions above): None, normal Incapacitation due to abnormal movements: None, normal Patient's awareness of abnormal movements (rate only patient's report): No Awareness, Dental Status Current problems with teeth and/or dentures?: No Does patient usually wear dentures?: No  CIWA:    COWS:     Musculoskeletal: Strength & Muscle Tone: within normal limits Gait & Station: normal Patient leans: N/A  Psychiatric Specialty Exam: Physical Exam  Nursing note and vitals reviewed. Constitutional: She appears well-developed.  Cardiovascular: Normal rate.   Musculoskeletal: Normal range of motion.  Neurological: She is alert.  Skin: Skin is warm.  Psychiatric: She has a normal mood and affect. Her behavior is normal.    Review of Systems   Psychiatric/Behavioral: Negative for suicidal ideas. Depression: stable. Hallucinations: stable. The patient does not have insomnia. Nervous/anxious: stable.     Blood pressure (!) 100/59, pulse (!) 101, temperature 97.7 F (36.5 C), temperature source Oral, resp. rate 18, height 5' 9.5" (1.765 m), weight (!) 141.5 kg (312 lb), last menstrual period 02/25/2016.Body mass index is 45.41 kg/m.    Have you used any form of tobacco in the last 30 days? (Cigarettes, Smokeless Tobacco, Cigars, and/or Pipes): No  Has this patient used any form of tobacco in the last 30 days? (Cigarettes, Smokeless Tobacco, Cigars, and/or Pipes) Yes, Yes, A prescription for an FDA-approved tobacco cessation medication was offered at discharge and the patient refused  Blood Alcohol level:  Lab Results  Component Value Date   [facility] <5 02/27/2016   ETH <5 02/17/2016    Metabolic Disorder Labs:  Lab Results  Component Value Date   HGBA1C 5.1 03/01/2016   MPG 100 03/01/2016   No results found for: PROLACTIN Lab Results  Component Value Date   CHOL 160 03/01/2016   TRIG 233 (H) 03/01/2016   HDL 27 (L) 03/01/2016   CHOLHDL 5.9 03/01/2016   VLDL 47 (H) 03/01/2016   LDLCALC 86 03/01/2016    See Psychiatric Specialty Exam and Suicide Risk Assessment completed by Attending Physician prior to discharge.  Discharge destination:  Home  Is patient on multiple antipsychotic therapies at discharge:  No   Has Patient had  three or more failed trials of antipsychotic monotherapy by history:  No  Recommended Plan for Multiple Antipsychotic Therapies: NA  Discharge Instructions    Diet - low sodium heart healthy    Complete by:  As directed   Discharge instructions    Complete by:  As directed   Take all medications as prescribed. Keep all follow-up appointments as scheduled.  Do not consume alcohol or use illegal drugs while on prescription medications. Report any adverse effects from your medications to your  primary care provider promptly.  In the event of recurrent symptoms or worsening symptoms, call 911, a crisis hotline, or go to the nearest emergency department for evaluation.   Increase activity slowly    Complete by:  As directed       Medication List    STOP taking these medications   hydrOXYzine 25 MG tablet Commonly known as:  ATARAX/VISTARIL   lamoTRIgine 25 MG tablet Commonly known as:  LAMICTAL     TAKE these medications     Indication  antiseptic oral rinse Liqd 15 mLs by Mouth Rinse route as needed for dry mouth.  Indication:  Mouth Soreness, may take bottle in med cart   ARIPiprazole 5 MG tablet Commonly known as:  ABILIFY Take 1 tablet (5 mg total) by mouth 2 (two) times daily in the am and at bedtime..  Indication:  Major Depressive Disorder   divalproex 500 MG 24 hr tablet Commonly known as:  DEPAKOTE ER Take 2 tablets (1,000 mg total) by mouth at bedtime.  Indication:  mood disorders   gabapentin 100 MG capsule Commonly known as:  NEURONTIN Take 2 capsules (200 mg total) by mouth 2 (two) times daily.  Indication:  Agitation   levothyroxine 125 MCG tablet Commonly known as:  SYNTHROID, LEVOTHROID Take 2 tablets (250 mcg total) by mouth daily before breakfast.  Indication:  Underactive Thyroid   pantoprazole 40 MG tablet Commonly known as:  PROTONIX Take 1 tablet (40 mg total) by mouth 2 (two) times daily before a meal.  Indication:  Gastroesophageal Reflux Disease   traZODone 50 MG tablet Commonly known as:  DESYREL Take 1 tablet (50 mg total) by mouth at bedtime and may repeat dose one time if needed. What changed:  when to take this  reasons to take this  Indication:  Trouble Sleeping   trihexyphenidyl 2 MG tablet Commonly known as:  ARTANE Take 1 tablet (2 mg total) by mouth at bedtime.  Indication:  Extrapyramidal Reaction caused by Medications      Follow-up Information    [facility].   Why:   Wednesday at 11:30 with Dr Nile RiggsShapiro.  Dr Albin FischerMoffatt did not have any availability until September. Contact information: [facility]  [address] [place]            Follow-up recommendations:  Activity:  as tolerated Diet:  heart healthy Other:  Depakote level recheck in 5days and thyroid managemt  Comments:  Take all medications as prescribed. Keep all follow-up appointments as scheduled.  Do not consume alcohol or use illegal drugs while on prescription medications. Report any adverse effects from your medications to your primary care provider promptly.  In the event of recurrent symptoms or worsening symptoms, call 911, a crisis hotline, or go to the nearest emergency department for evaluation.   Signed: Oneta Rackanika N Sarahanne Novakowski, NP 03/06/2016, 3:11 PM

## 2016-03-06 NOTE — Progress Notes (Signed)
Patient discharged to lobby. Patient was stable and appreciative at that time. All papers and prescriptions were given and valuables returned. Verbal understanding expressed. Denies SI/HI and A/VH. Patient given opportunity to express concerns and ask questions.  

## 2016-03-06 NOTE — Tx Team (Signed)
Interdisciplinary Treatment Plan Update (Adult)  Date:  03/06/2016   Time Reviewed:  8:20 AM   Progress in Treatment: Attending groups: Yes. Participating in groups:  Yes. Taking medication as prescribed:  Yes. Tolerating medication:  Yes. Family/Significant other contact made:  No Patient understands diagnosis:  Yes  As evidenced by seeking help with "I need someone to help me with things like cooking." Discussing patient identified problems/goals with staff:  Yes, see initial care plan. Medical problems stabilized or resolved:  Yes. Denies suicidal/homicidal ideation: Yes. Issues/concerns per patient self-inventory:  No. Other:  New problem(s) identified:  Discharge Plan or Barriers: see below  Reason for Continuation of Hospitalization:   Comments:  Meghan Hensley is an 45 y.o. single female who presents unaccompanied to [place] reporting symptoms of anxiety, worthlessness, auditory hallucinations and disorganized thinking. Pt was discharged from [facility] 02/24/16 and then presented to [facility] the next day reporting similar symptoms. Pt states she is unable to take her synthroid and psychiatric medications because "I can't understand how to take the medications." Pt feels there is something wrong with her and "I feel destabilized."Pt first denies she is experiencing auditory hallucinations then says she hears voices. Will restart Lamictal 25 mg po daily for mood sx. Will start Abilify 5 mg po qhs for psychosis/mood sx. Will add Neurontin 200 mg po bid for mood sx, anxiety sx. Will continue Trazodone 50 mg po qhs for sleep.  Estimated length of stay:  D/C today  New goal(s):  Review of initial/current patient goals per problem list:   Review of initial/current patient goals per problem list:  1. Goal(s): Patient will participate in aftercare plan   Met: Yes   Target date: 3-5 days post admission date   As evidenced by: Patient will participate within aftercare plan AEB  aftercare provider and housing plan at discharge being identified. 03/01/16:  "I need someone to help me every day.  I don't have anyone right now." 03/06/16:  Given train ticket to [place], follow up with PCP  2. Goal (s): Patient will exhibit decreased depressive symptoms and suicidal ideations.   Met: Yes   Target date: 3-5 days post admission date   As evidenced by: Patient will utilize self rating of depression at 3 or below and demonstrate decreased signs of depression or be deemed stable for discharge by MD. 03/01/16:  Rates depression a 5 today 03/06/16:  Rates depression a 3 today  5. Goal(s): Patient will demonstrate decreased signs of psychosis  * Met: Yes  * Target date: 3-5 days post admission date  * As evidenced by: Patient will demonstrate decreased frequency of AVH or return to baseline function 03/01/16:  Pt presents as vague, guarded, suspicious 03/06/16:  Pt at baseline      Attendees: Patient:  03/06/2016 8:20 AM   Family:   03/06/2016 8:20 AM   Physician:  Ursula Alert, MD 03/06/2016 8:20 AM   Nursing:   Jeanie Cooks, RN 03/06/2016 8:20 AM   CSW:    Roque Lias, LCSW   03/06/2016 8:20 AM   Other:  03/06/2016 8:20 AM   Other:   03/06/2016 8:20 AM   Other:  Lars Pinks, Nurse CM 03/06/2016 8:20 AM   Other:   03/06/2016 8:20 AM   Other:  Norberto Sorenson, Viola  03/06/2016 8:20 AM   Other:  03/06/2016 8:20 AM   Other:  03/06/2016 8:20 AM   Other:  03/06/2016 8:20 AM   Other:  03/06/2016 8:20 AM  Other:  03/06/2016 8:20 AM   Other:   03/06/2016 8:20 AM    Scribe for Treatment Team:   Trish Mage, 03/06/2016 8:20 AM

## 2016-03-06 NOTE — Progress Notes (Signed)
  [facility] Discharge [facility] :  Will you be returning to the same living situation after discharge:  No. At discharge, do you have transportation home?: Yes,  cab to train station, train ticket to [place] Do you have the ability to pay for your medications: Yes,  MCR  Release of information consent forms completed and in the chart;  Patient's signature needed at discharge.  Patient to Follow up at: Follow-up Information    [facility] Follow up on 03/08/2016.   Why:  Wednesday at 11:30 with Dr Nile RiggsShapiro.  Dr Albin FischerMoffatt did not have any availability until September. Contact information: [facility]  [address] [place]            Next level of care provider has [facility] to [facility]:no  Safety Planning and Suicide Prevention discussed: Yes,  yes  Have you used any form of tobacco in the last 30 days? (Cigarettes, Smokeless Tobacco, Cigars, and/or Pipes): No  Has patient been referred to the Quitline?: N/A patient is not a smoker  Patient has been referred for addiction treatment: N/A  Meghan Rogueorth, Meghan Hensley 03/06/2016, 3:58 PM

## 2016-03-06 NOTE — Progress Notes (Signed)
Recreation Therapy Notes  Date: 03/06/16 Time: 1005 Location: 500 Hall Dayroom  Group Topic: Communication  Goal Area(s) Addresses:  Patient will effectively communicate with peers in group.  Patient will verbalize benefit of healthy communication. Patient will verbalize positive effect of healthy communication on post d/c goals.  Patient will identify communication techniques that made activity effective for group.   Behavioral Response: Engaged  Intervention: Bag, various items  Activity: What is it?  LRT placed various items in a bag.  Patients took turns taking an item from the bag and describing it to the rest of the group.  The remaining members of the group had to guess what was being described to them.  The person that guessed the item, would come up and pick a different item to describe.   Education: Communication, Discharge Planning  Education Outcome: Acknowledges understanding/In group clarification offered/Needs additional education.   Clinical Observations/Feedback:  Pt was very active in group.  Pt stated that effective communication "will hopefully help me to talk to people and relate to them".  Pt also stated she just wanted to "see what happens".      Caroll RancherMarjette Ilene Witcher, LRT/CTRS

## 2016-03-07 NOTE — [facility] Suicide Risk Assessment (Signed)
[facility] INPATIENT:  Family/Significant Other Suicide Prevention Education  Suicide Prevention Education:  Patient Refusal for Family/Significant Other Suicide Prevention Education: The patient Meghan Hensley has refused to provide written consent for family/significant other to be provided Family/Significant Other Suicide Prevention Education during admission and/or prior to discharge.  Physician notified.  Meghan Hensley, Meghan Hensley 03/07/2016, 8:43 AM

## 2016-03-13 ENCOUNTER — Ambulatory Visit (HOSPITAL_COMMUNITY): Admission: AD | Admit: 2016-03-13 | Payer: Medicare Other | Source: Intra-hospital | Admitting: Psychiatry

## 2016-03-13 ENCOUNTER — Emergency Department (HOSPITAL_COMMUNITY)
Admission: EM | Admit: 2016-03-13 | Discharge: 2016-03-13 | Disposition: A | Discharge status: Home / Self Care | Payer: Medicare Other | Attending: Emergency Medicine | Admitting: Emergency Medicine

## 2016-03-13 ENCOUNTER — Other Ambulatory Visit: Payer: Self-pay

## 2016-03-13 ENCOUNTER — Encounter (HOSPITAL_COMMUNITY): Payer: Self-pay

## 2016-03-13 ENCOUNTER — Emergency Department (HOSPITAL_COMMUNITY)
Admission: EM | Admit: 2016-03-13 | Discharge: 2016-03-14 | Disposition: A | Discharge status: Home / Self Care | Payer: Medicare Other | Attending: Emergency Medicine | Admitting: Emergency Medicine

## 2016-03-13 ENCOUNTER — Emergency Department (HOSPITAL_COMMUNITY): Payer: Medicare Other

## 2016-03-13 DIAGNOSIS — Z87891 Personal history of nicotine dependence: Secondary | ICD-10-CM | POA: Diagnosis not present

## 2016-03-13 DIAGNOSIS — F250 Schizoaffective disorder, bipolar type: Secondary | ICD-10-CM | POA: Diagnosis not present

## 2016-03-13 DIAGNOSIS — Z79899 Other long term (current) drug therapy: Secondary | ICD-10-CM | POA: Diagnosis not present

## 2016-03-13 DIAGNOSIS — E039 Hypothyroidism, unspecified: Secondary | ICD-10-CM | POA: Insufficient documentation

## 2016-03-13 DIAGNOSIS — R079 Chest pain, unspecified: Secondary | ICD-10-CM

## 2016-03-13 DIAGNOSIS — R45851 Suicidal ideations: Secondary | ICD-10-CM | POA: Insufficient documentation

## 2016-03-13 DIAGNOSIS — R0789 Other chest pain: Secondary | ICD-10-CM | POA: Insufficient documentation

## 2016-03-13 DIAGNOSIS — F329 Major depressive disorder, single episode, unspecified: Secondary | ICD-10-CM | POA: Diagnosis not present

## 2016-03-13 LAB — COMPREHENSIVE METABOLIC PANEL
ALT: 46 U/L (ref 14–54)
AST: 26 U/L (ref 15–41)
Albumin: 3.7 g/dL (ref 3.5–5.0)
Alkaline Phosphatase: 66 U/L (ref 38–126)
Anion gap: 7 (ref 5–15)
BUN: 16 mg/dL (ref 6–20)
CALCIUM: 8.7 mg/dL — AB (ref 8.9–10.3)
CHLORIDE: 106 mmol/L (ref 101–111)
CO2: 24 mmol/L (ref 22–32)
CREATININE: 0.93 mg/dL (ref 0.44–1.00)
Glucose, Bld: 111 mg/dL — ABNORMAL HIGH (ref 65–99)
POTASSIUM: 3.8 mmol/L (ref 3.5–5.1)
Sodium: 137 mmol/L (ref 135–145)
Total Bilirubin: 0.6 mg/dL (ref 0.3–1.2)
Total Protein: 7.7 g/dL (ref 6.5–8.1)

## 2016-03-13 LAB — RAPID URINE DRUG SCREEN, HOSP PERFORMED
Amphetamines: NOT DETECTED
BARBITURATES: NOT DETECTED
BENZODIAZEPINES: NOT DETECTED
COCAINE: NOT DETECTED
Opiates: NOT DETECTED
TETRAHYDROCANNABINOL: NOT DETECTED

## 2016-03-13 LAB — CBC
HCT: 36.7 % (ref 36.0–46.0)
HEMATOCRIT: 33.5 % — AB (ref 36.0–46.0)
HEMOGLOBIN: 10.6 g/dL — AB (ref 12.0–15.0)
HEMOGLOBIN: 11.4 g/dL — AB (ref 12.0–15.0)
MCH: 28.7 pg (ref 26.0–34.0)
MCH: 28.3 pg (ref 26.0–34.0)
MCHC: 31.6 g/dL (ref 30.0–36.0)
MCHC: 31.1 g/dL (ref 30.0–36.0)
MCV: 90.8 fL (ref 78.0–100.0)
MCV: 91.1 fL (ref 78.0–100.0)
PLATELETS: 355 10*3/uL (ref 150–400)
Platelets: 303 10*3/uL (ref 150–400)
RBC: 3.69 MIL/uL — ABNORMAL LOW (ref 3.87–5.11)
RBC: 4.03 MIL/uL (ref 3.87–5.11)
RDW: 15.8 % — ABNORMAL HIGH (ref 11.5–15.5)
RDW: 15.7 % — ABNORMAL HIGH (ref 11.5–15.5)
WBC: 8.3 10*3/uL (ref 4.0–10.5)
WBC: 9.8 10*3/uL (ref 4.0–10.5)

## 2016-03-13 LAB — BASIC METABOLIC PANEL
Anion gap: 6 (ref 5–15)
BUN: 16 mg/dL (ref 6–20)
CHLORIDE: 104 mmol/L (ref 101–111)
CO2: 27 mmol/L (ref 22–32)
CREATININE: 0.97 mg/dL (ref 0.44–1.00)
Calcium: 9.2 mg/dL (ref 8.9–10.3)
GFR calc non Af Amer: >60 mL/min (ref >60)
GLUCOSE: 99 mg/dL (ref 65–99)
Potassium: 3.9 mmol/L (ref 3.5–5.1)
Sodium: 137 mmol/L (ref 135–145)

## 2016-03-13 LAB — SALICYLATE LEVEL: Salicylate Lvl: <4 mg/dL (ref 2.8–30.0)

## 2016-03-13 LAB — I-STAT TROPONIN, ED: Troponin i, poc: 0 ng/mL (ref 0.00–0.08)

## 2016-03-13 LAB — ACETAMINOPHEN LEVEL

## 2016-03-13 LAB — POC URINE PREG, ED: PREG TEST UR: NEGATIVE

## 2016-03-13 LAB — ETHANOL: Alcohol, Ethyl (B): <5 mg/dL (ref <5)

## 2016-03-13 NOTE — ED Notes (Signed)
Psych at bedside.

## 2016-03-13 NOTE — ED Triage Notes (Addendum)
Pt BIB [facility] after c/o generalized fatigue x 1 month. Pt is non-compliant with psych and thyroid meds even after being given samples by clinic. Pt also c/o chest pain today and states it may be from anxiety after death in the family 1 year ago.Pt was seen by TTS for similar complaint earlier today and did not meet inpatient criteria. Pt alert and oriented. Seen earlier today at this facility.

## 2016-03-13 NOTE — ED Triage Notes (Signed)
Pt [place] via [facility] [place] airport. Pt states that she has R hip pain following [place]. Bilateral edema, R foot abscess. Also states that she has been struggling with depression and anxiety since the passing of her father. A&Ox4. Refuses to attempt ambulation.

## 2016-03-13 NOTE — ED Provider Notes (Signed)
[facility]-EMERGENCY DEPT Provider Note   CSN: [id number] Arrival date & time: 03/13/16  0120  By signing my name below, I, [name], attest that this documentation has been prepared under the direction and in the presence of Geoffery Lyonsouglas Shandelle Borrelli, MD. Electronically Signed: [name], ED Scribe. 03/13/16. 3:19 AM.  First Provider Contact:  None    History   Chief Complaint Chief Complaint  Patient presents with  . Hip Pain    R  . Suicidal   The history is provided by the patient. No language interpreter was used.  HPI Comments: Meghan Hensley is a 45 y.o. female with a hx of Bipolar disorder, schizophrenia, thyroid disease, akathisia, and bilateral edema of lower extremity brought in by EMS who presents to the Emergency Department complaining of gradually worsening left hip pain. She states that she did a lot of walking "at a kid's park" and is now having a lot of pain to the point where she cannot ambulate. She also states that she is having difficulty with worsening depression. She expresses passive SI with no plan. She states, "I'm in a lot of pain and I wonder if anyone cares. I'm just talking hypothetically." She reports that she has been taking her medication "as much as possible."   Past Medical History:  Diagnosis Date  . Bipolar 1 disorder (HCC)   . Schizophrenia (HCC)   . Thyroid disease     Patient Active Problem List   Diagnosis Date Noted  . Akathisia 03/03/2016  . Hypothyroidism 02/21/2016  . Bilateral edema of lower extremity 02/21/2016  . Schizoaffective disorder, bipolar type (HCC) 02/17/2016    History reviewed. No pertinent surgical history.  OB History    No data available       Home Medications    Prior to Admission medications   Medication Sig Start Date End Date Taking? Authorizing Provider  antiseptic oral rinse (BIOTENE) LIQD 15 mLs by Mouth Rinse route as needed for dry mouth. 03/06/16  Yes Oneta Rackanika N Lewis, NP  ARIPiprazole (ABILIFY) 5 MG  tablet Take 1 tablet (5 mg total) by mouth 2 (two) times daily in the am and at bedtime.. 03/06/16  Yes Oneta Rackanika N Lewis, NP  divalproex (DEPAKOTE ER) 500 MG 24 hr tablet Take 2 tablets (1,000 mg total) by mouth at bedtime. 03/06/16  Yes Oneta Rackanika N Lewis, NP  gabapentin (NEURONTIN) 100 MG capsule Take 2 capsules (200 mg total) by mouth 2 (two) times daily. 03/06/16  Yes Oneta Rackanika N Lewis, NP  levothyroxine (SYNTHROID, LEVOTHROID) 125 MCG tablet Take 2 tablets (250 mcg total) by mouth daily before breakfast. 03/06/16  Yes Oneta Rackanika N Lewis, NP  pantoprazole (PROTONIX) 40 MG tablet Take 1 tablet (40 mg total) by mouth 2 (two) times daily before a meal. 02/24/16  Yes Adonis BrookSheila Agustin, NP  traZODone (DESYREL) 50 MG tablet Take 1 tablet (50 mg total) by mouth at bedtime and may repeat dose one time if needed. 03/06/16  Yes Oneta Rackanika N Lewis, NP  trihexyphenidyl (ARTANE) 2 MG tablet Take 1 tablet (2 mg total) by mouth at bedtime. 03/06/16  Yes Oneta Rackanika N Lewis, NP    Family History Family History  Problem Relation Age of Onset  . Cancer Father   . Mental illness Neg Hx     Social History Social History  Substance Use Topics  . Smoking status: Former Games developermoker  . Smokeless tobacco: Never Used  . Alcohol use No     Allergies   Review of patient's allergies indicates no  known allergies.   Review of Systems Review of Systems  Musculoskeletal: Positive for arthralgias.  Psychiatric/Behavioral: Positive for suicidal ideas.  All other systems reviewed and are negative.    Physical Exam Updated Vital Signs BP 125/59 (BP Location: Left Arm)   Pulse 83   Temp 97.8 F (36.6 C) (Oral)   LMP 02/25/2016 (Exact Date)   SpO2 96%   Physical Exam  Constitutional: She is oriented to person, place, and time. She appears well-developed and well-nourished. No distress.  HENT:  Head: Normocephalic and atraumatic.  Eyes: EOM are normal.  Neck: Normal range of motion.  Cardiovascular: Normal rate, regular rhythm and normal  heart sounds.   Pulmonary/Chest: Effort normal and breath sounds normal.  Abdominal: Soft. She exhibits no distension. There is no tenderness.  Musculoskeletal: Normal range of motion.  Left hip appears grossly normal. She has full ROM of the hip. Distal PMSs intact.  Neurological: She is alert and oriented to person, place, and time.  Skin: Skin is warm and dry.  Psychiatric: Her speech is normal. She is withdrawn. Cognition and memory are normal. She expresses impulsivity. She exhibits a depressed mood. She expresses suicidal ideation.  Nursing note and vitals reviewed.    ED Treatments / Results  DIAGNOSTIC STUDIES: Oxygen Saturation is 96% on RA, normal by my interpretation.    COORDINATION OF CARE: 3:18 AM-Discussed treatment plan which includes labs and TTS consults with pt at bedside and pt agreed to plan.    Labs (all labs ordered are listed, but only abnormal results are displayed) Labs Reviewed  COMPREHENSIVE METABOLIC PANEL - Abnormal; Notable for the following:       Result Value   Glucose, Bld 111 (*)    Calcium 8.7 (*)    All other components within normal limits  ACETAMINOPHEN LEVEL - Abnormal; Notable for the following:    Acetaminophen (Tylenol), Serum <10 (*)    All other components within normal limits  CBC - Abnormal; Notable for the following:    RBC 3.69 (*)    Hemoglobin 10.6 (*)    HCT 33.5 (*)    RDW 15.8 (*)    All other components within normal limits  ETHANOL  SALICYLATE LEVEL  URINE RAPID DRUG SCREEN, HOSP PERFORMED  POC URINE PREG, ED    EKG  EKG Interpretation None       Radiology No results found.  Procedures Procedures (including critical care time)  Medications Ordered in ED Medications - No data to display   Initial Impression / Assessment and Plan / ED Course  I have reviewed the triage vital signs and the nursing notes.  Pertinent labs & imaging results that were available during my care of the patient were reviewed  by me and considered in my medical decision making (see chart for details).  Clinical Course      Final Clinical Impressions(s) / ED Diagnoses   Final diagnoses:  None   Patient presents with complaints of suicidal ideation requesting help. TTS will be consulted and will determine the final disposition. She is medically clear for psychiatric evaluation.   I personally performed the services described in this documentation, which was scribed in my presence. The recorded information has been reviewed and is accurate.    New Prescriptions New Prescriptions   No medications on file     Geoffery Lyonsouglas Slyvia Lartigue, MD 03/13/16 [id number]

## 2016-03-13 NOTE — ED Notes (Signed)
Patient ambulatory to restroom without staff assistance.

## 2016-03-13 NOTE — BH Assessment (Addendum)
Tele Assessment Note   Meghan Hensley is an 45 y.o. female. Pt presents to ED initially for leg pain. Pt voiced passive SI without plan when in triage. Pt has history of schizoaffective, bipolar type. Pt denied Suicidal ideation, homicidal ideation and hallucinations during clinical interview. Pt denied history of suicide attempt. When asked history of inpatient admissions, pt mentions hospitalization in [place] twenty years ago. Pt denies hospitalization at any additional behavioral health facilities. Pt's chart notes two inpatient admissions (01/2016).   Pt presented during assessment as lethargic and with irrelevant thought processes. When asked about history of abuse, pt stated "not that I'm aware of but, I'm somebody that sometimes has little surprises and things like that".   Diagnosis: F25.0 Schizoaffective, bipolar type  Past Medical History:  Past Medical History:  Diagnosis Date  . Bipolar 1 disorder (HCC)   . Schizophrenia (HCC)   . Thyroid disease     History reviewed. No pertinent surgical history.  Family History:  Family History  Problem Relation Age of Onset  . Cancer Father   . Mental illness Neg Hx     Social History:  reports that she has quit smoking. She has never used smokeless tobacco. She reports that she does not drink alcohol or use drugs.  Additional Social History:  Alcohol / Drug Use Pain Medications: No abuse reported Prescriptions: No abuse reported Over the Counter: No abuse reported History of alcohol / drug use?: No history of alcohol / drug abuse  CIWA: CIWA-Ar BP: 125/59 Pulse Rate: 83 COWS:    PATIENT STRENGTHS: (choose at least two) Average or above average intelligence General fund of knowledge  Allergies: No Known Allergies  Home Medications:  (Not in a hospital admission)  OB/GYN Status:  Patient's last menstrual period was 02/25/2016 (exact date).  General Assessment Data Location of Assessment: [facility] TTS Assessment: In  system Is this a Tele or Face-to-Face Assessment?: Tele Assessment Is this an Initial Assessment or a Re-assessment for this encounter?: Initial Assessment Marital status: Single Maiden name: NA Is patient pregnant?: Unknown Pregnancy Status: Unknown Living Arrangements: Other (Comment) (UTA "I'm in the process of transitioning") Can pt return to current living arrangement?:  (UTA) Admission Status: Voluntary Is patient capable of signing voluntary admission?: Yes Referral Source: Self/Family/Friend Insurance type: Medicare     Crisis Care Plan Living Arrangements: Other (Comment) (UTA "I'm in the process of transitioning") Name of Psychiatrist: None (Per Chart) Name of Therapist: None (Per Chart)  Education Status Is patient currently in school?: No Highest grade of school patient has completed: BA degree (Per Chart)  Risk to self with the past 6 months Suicidal Ideation: No Has patient been a risk to self within the past 6 months prior to admission? : Yes Suicidal Intent: No Has patient had any suicidal intent within the past 6 months prior to admission? : Yes Is patient at risk for suicide?: No Suicidal Plan?: No Has patient had any suicidal plan within the past 6 months prior to admission? : Yes Specify Current Suicidal Plan: None Access to Means: No What has been your use of drugs/alcohol within the last 12 months?: Pt denies use of drugs/alcohol Previous Attempts/Gestures: Yes (Per chart-pt denies) How many times?: 2 (per chart) Other Self Harm Risks: None Triggers for Past Attempts: Unpredictable Intentional Self Injurious Behavior: None Family Suicide History: No Recent stressful life event(s):  (None Reported) Persecutory voices/beliefs?: Yes Depression: No (Pt denies) Depression Symptoms:  (Pt denies depressive symptoms) Substance abuse history and/or  treatment for substance abuse?: Yes (Per chart) Suicide prevention information given to non-admitted patients:  Not applicable  Risk to Others within the past 6 months Homicidal Ideation: No Does patient have any lifetime risk of violence toward others beyond the six months prior to admission? : No Thoughts of Harm to Others: No Current Homicidal Intent: No Current Homicidal Plan: No Access to Homicidal Means: No History of harm to others?: No Assessment of Violence: None Noted Does patient have access to weapons?: No Criminal Charges Pending?: No Does patient have a court date: No Is patient on probation?: No  Psychosis Hallucinations:  (Pt denies) Delusions: None noted  Mental Status Report Appearance/Hygiene:  (covered by hospital blanket) Eye Contact: Poor Motor Activity: Unremarkable Speech: Soft Level of Consciousness: Quiet/awake, Drowsy Mood: Sullen Affect: Appropriate to circumstance Anxiety Level: None Thought Processes: Irrelevant Judgement: Impaired Orientation: Person, Place, Time, Situation Obsessive Compulsive Thoughts/Behaviors: None  Cognitive Functioning Concentration: Decreased Memory: Recent Intact, Remote Intact IQ: Average Insight: Poor Impulse Control: Unable to Assess Appetite:  (UTA "When I care about it") Weight Loss: 30 Weight Gain: 0 Sleep: Unable to Assess Total Hours of Sleep:  (UTA) Vegetative Symptoms: None  ADLScreening [facility]) Patient's cognitive ability adequate to safely complete daily activities?: Yes Patient able to express need for assistance with ADLs?: Yes Independently performs ADLs?: Yes (appropriate for developmental age) (however, pt states she can "barely" walk- pt chart reflects no pt difficulty with ambulaltion )  Prior Inpatient Therapy Prior Inpatient Therapy: Yes Prior Therapy Dates: 02/25/16 Prior Therapy Facilty/Provider(s): [facility] Reason for Treatment: Schizoaffective disorder  Prior Outpatient Therapy Prior Outpatient Therapy: Yes Prior Therapy Dates: unknown Prior Therapy  Facilty/Provider(s): unknown Reason for Treatment: Schzioaffective disorder Does patient have an ACCT team?: Unknown Does patient have Intensive In-House Services?  : No Does patient have [facility] services? : Unknown Does patient have [facility] services?: No  ADL Screening (condition at time of admission) Patient's cognitive ability adequate to safely complete daily activities?: Yes Is the patient deaf or have difficulty hearing?: No Does the patient have difficulty seeing, even when wearing glasses/contacts?: No Does the patient have difficulty concentrating, remembering, or making decisions?: Yes Patient able to express need for assistance with ADLs?: Yes Does the patient have difficulty dressing or bathing?: No Independently performs ADLs?: Yes (appropriate for developmental age) (however, pt states she can "barely" walk- pt chart reflects no pt difficulty with ambulaltion ) Does the patient have difficulty walking or climbing stairs?: No (Pt reports she can "barely" walk. Pt chart notes pt refusal to ambulate and notes no difficulty with ambulation) Weakness of Legs: None Weakness of Arms/Hands: None  Home Assistive Devices/Equipment Home Assistive Devices/Equipment: None  Therapy Consults (therapy consults require a physician order) PT Evaluation Needed: No OT Evalulation Needed: No SLP Evaluation Needed: No Abuse/Neglect Assessment (Assessment to be complete while patient is alone) Physical Abuse: Denies Verbal Abuse: Denies Sexual Abuse: Denies Exploitation of patient/patient's resources: Denies Self-Neglect: Denies Values / Beliefs Cultural Requests During Hospitalization: None Spiritual Requests During Hospitalization: None Consults Spiritual Care Consult Needed: No Social Work Consult Needed: No Merchant navy officer (For Healthcare) Does patient have an advance directive?: No Would patient like information on creating an advanced directive?: No - patient declined  information    Additional Information 1:1 In Past 12 Months?: Yes CIRT Risk: No Elopement Risk: No Does patient have medical clearance?: No     Disposition: Alberteen Sam, NP recommends AM psychiatric evaluation. [place]um, RN has been informed of pt  disposition.  Disposition Initial Assessment Completed for this Encounter: Yes Other disposition(s): Other (Comment) (Pending Psychiatric Recommendatin)  Electra Paladino J [place] 03/13/2016 5:08 AM

## 2016-03-13 NOTE — Discharge Instructions (Signed)
For your ongoing mental health needs, you are advised to follow up with one of the following providers:       [facility]      [address]      [place], [place] [address]      [phone]      New and returning patients are seen at their walk-in clinic.  Walk-in hours are Monday - Friday from 8:00 am - 3:00 pm.  Walk-in patients are seen on a first come, first served basis.  Try to arrive as early as possible for he best chance of being seen the same day.        [facility]      [address]      [place], [place] [address]      [phone]      New patients are seen at their walk-in clinic.  Walk-in hours are Monday - Friday from 8:00 am - 12:00 pm, and from 1:00 pm - 3:00 pm.  Walk-in patients are seen on a first come, first served basis, so try to arrive as early as possible for the best chance of being seen the same day.  There is an initial fee of $22.50.

## 2016-03-13 NOTE — BH Assessment (Signed)
[facility] Assessment Progress Note  Per Nelly RoutArchana Kumar, MD, this pt does not require psychiatric hospitalization at this time.  [facility] or [facility].  This has been included in pt's discharge instructions.  Pt's nurse has been notified.  Meghan Canninghomas Kyreese Chio, MA Triage Specialist [phone]

## 2016-03-13 NOTE — ED Notes (Addendum)
Floyd at bedside assessing pt for complaints mentioned 1415. Post assessment pt denies questions and ready for discharge. Pt dressing at present time.

## 2016-03-13 NOTE — ED Notes (Signed)
Pt given yellow socks and ambulated to restroom w/o assistance.

## 2016-03-13 NOTE — Consult Note (Signed)
[facility] Consult   Reason for Consult:  Depression  Referring Physician:  EDP Patient Identification: Meghan Hensley MRN:  [id number] Principal Diagnosis: Schizoaffective disorder, bipolar type [facility]) Diagnosis:   Patient Active Problem List   Diagnosis Date Noted  . Schizoaffective disorder, bipolar type ([place]) [F25.0] 02/17/2016    Priority: High  . Akathisia [R45.0] 03/03/2016  . Hypothyroidism [E03.9] 02/21/2016  . Bilateral edema of lower extremity [R60.0] 02/21/2016    Total Time spent with patient: 45 minutes  Subjective:   Meghan Hensley is a 45 y.o. female patient does not warrant admission.  HPI:  45 yo female who presented to the ED with physical complaints and depression.  No suicidal/homicidal ideations, hallucinations, and alcohol/drug abuse.  She reports she returned from [facility] yesterday after being at [facility] and her feet hurt and she got sunburned.  Donnabelle has been frequenting the EDs and facilities in the area with the same complaints of having thyroid problems for "17 years" but being treated for it.  She reports she does have a place to live but just moved out of her apartment.  Receives SSI and does not spend her money wisely.  She has resources and medications, stable for discharge.  Past Psychiatric History: schizoaffective disorder, bipolar type  Risk to Self: Suicidal Ideation: No Suicidal Intent: No Is patient at risk for suicide?: No Suicidal Plan?: No Specify Current Suicidal Plan: None Access to Means: No What has been your use of drugs/alcohol within the last 12 months?: Pt denies use of drugs/alcohol How many times?: 2 (per chart) Other Self Harm Risks: None Triggers for Past Attempts: Unpredictable Intentional Self Injurious Behavior: None Risk to Others: Homicidal Ideation: No Thoughts of Harm to Others: No Current Homicidal Intent: No Current Homicidal Plan: No Access to Homicidal Means: No History of harm to  others?: No Assessment of Violence: None Noted Does patient have access to weapons?: No Criminal Charges Pending?: No Does patient have a court date: No Prior Inpatient Therapy: Prior Inpatient Therapy: Yes Prior Therapy Dates: 02/25/16 Prior Therapy Facilty/Provider(s): [facility] Reason for Treatment: Schizoaffective disorder Prior Outpatient Therapy: Prior Outpatient Therapy: Yes Prior Therapy Dates: unknown Prior Therapy Facilty/Provider(s): unknown Reason for Treatment: Schzioaffective disorder Does patient have an ACCT team?: Unknown Does patient have Intensive In-House Services?  : No Does patient have [facility] services? : Unknown Does patient have P4CC services?: No  Past Medical History:  Past Medical History:  Diagnosis Date  . Bipolar 1 disorder ([place])   . Schizophrenia ([place])   . Thyroid disease    History reviewed. No pertinent surgical history. Family History:  Family History  Problem Relation Age of Onset  . Cancer Father   . Mental illness Neg Hx    Family Psychiatric  History: none Social History:  History  Alcohol Use No     History  Drug Use No    Social History   Social History  . Marital status: Divorced    Spouse name: N/A  . Number of children: N/A  . Years of education: N/A   Social History Main Topics  . Smoking status: Former Research scientist (life sciences)  . Smokeless tobacco: Never Used  . Alcohol use No  . Drug use: No  . Sexual activity: Not Currently   Other Topics Concern  . None   Social History Narrative  . None   Additional Social History:    Allergies:  No Known Allergies  Labs:  Results for orders placed or performed during the hospital  encounter of 03/13/16 (from the past 48 hour(s))  Comprehensive metabolic panel     Status: Abnormal   Collection Time: 03/13/16  2:03 AM  Result Value Ref Range   Sodium 137 135 - 145 mmol/L   Potassium 3.8 3.5 - 5.1 mmol/L   Chloride 106 101 - 111 mmol/L   CO2 24 22 - 32 mmol/L   Glucose, Bld 111 (H)  65 - 99 mg/dL   BUN 16 6 - 20 mg/dL   Creatinine, Ser 0.93 0.44 - 1.00 mg/dL   Calcium 8.7 (L) 8.9 - 10.3 mg/dL   Total Protein 7.7 6.5 - 8.1 g/dL   Albumin 3.7 3.5 - 5.0 g/dL   AST 26 15 - 41 U/L   ALT 46 14 - 54 U/L   Alkaline Phosphatase 66 38 - 126 U/L   Total Bilirubin 0.6 0.3 - 1.2 mg/dL   GFR calc non Af Amer >60 >60 mL/min   GFR calc Af Amer >60 >60 mL/min    Comment: (NOTE) The eGFR has been calculated using the CKD EPI equation. This calculation has not been validated in all clinical situations. eGFR's persistently <60 mL/min signify possible Chronic Kidney Disease.    Anion gap 7 5 - 15  Ethanol     Status: None   Collection Time: 03/13/16  2:03 AM  Result Value Ref Range   Alcohol, Ethyl (B) <5 <5 mg/dL    Comment:        LOWEST DETECTABLE LIMIT FOR SERUM ALCOHOL IS 5 mg/dL FOR MEDICAL PURPOSES ONLY   Salicylate level     Status: None   Collection Time: 03/13/16  2:03 AM  Result Value Ref Range   Salicylate Lvl <2.0 2.8 - 30.0 mg/dL  Acetaminophen level     Status: Abnormal   Collection Time: 03/13/16  2:03 AM  Result Value Ref Range   Acetaminophen (Tylenol), Serum <10 (L) 10 - 30 ug/mL    Comment:        THERAPEUTIC CONCENTRATIONS VARY SIGNIFICANTLY. A RANGE OF 10-30 ug/mL MAY BE AN EFFECTIVE CONCENTRATION FOR MANY PATIENTS. HOWEVER, SOME ARE BEST TREATED AT CONCENTRATIONS OUTSIDE THIS RANGE. ACETAMINOPHEN CONCENTRATIONS >150 ug/mL AT 4 HOURS AFTER INGESTION AND >50 ug/mL AT 12 HOURS AFTER INGESTION ARE OFTEN ASSOCIATED WITH TOXIC REACTIONS.   cbc     Status: Abnormal   Collection Time: 03/13/16  2:03 AM  Result Value Ref Range   WBC 8.3 4.0 - 10.5 K/uL   RBC 3.69 (L) 3.87 - 5.11 MIL/uL   Hemoglobin 10.6 (L) 12.0 - 15.0 g/dL   HCT 33.5 (L) 36.0 - 46.0 %   MCV 90.8 78.0 - 100.0 fL   MCH 28.7 26.0 - 34.0 pg   MCHC 31.6 30.0 - 36.0 g/dL   RDW 15.8 (H) 11.5 - 15.5 %   Platelets 303 150 - 400 K/uL  POC urine preg, ED     Status: None    Collection Time: 03/13/16  9:40 AM  Result Value Ref Range   Preg Test, Ur NEGATIVE NEGATIVE    Comment:        THE SENSITIVITY OF THIS METHODOLOGY IS >24 mIU/mL     No current facility-administered medications for this encounter.    Current Outpatient Prescriptions  Medication Sig Dispense Refill  . antiseptic oral rinse (BIOTENE) LIQD 15 mLs by Mouth Rinse route as needed for dry mouth. 1 Bottle `0  . ARIPiprazole (ABILIFY) 5 MG tablet Take 1 tablet (5 mg total) by mouth 2 (  two) times daily in the am and at bedtime.. 60 tablet 0  . divalproex (DEPAKOTE ER) 500 MG 24 hr tablet Take 2 tablets (1,000 mg total) by mouth at bedtime. 60 tablet 0  . gabapentin (NEURONTIN) 100 MG capsule Take 2 capsules (200 mg total) by mouth 2 (two) times daily. 60 capsule 0  . levothyroxine (SYNTHROID, LEVOTHROID) 125 MCG tablet Take 2 tablets (250 mcg total) by mouth daily before breakfast. 30 tablet 0  . pantoprazole (PROTONIX) 40 MG tablet Take 1 tablet (40 mg total) by mouth 2 (two) times daily before a meal. 60 tablet 0  . traZODone (DESYREL) 50 MG tablet Take 1 tablet (50 mg total) by mouth at bedtime and may repeat dose one time if needed. 30 tablet 0  . trihexyphenidyl (ARTANE) 2 MG tablet Take 1 tablet (2 mg total) by mouth at bedtime. 30 tablet 0    Musculoskeletal: Strength & Muscle Tone: within normal limits Gait & Station: normal Patient leans: N/A  Psychiatric Specialty Exam: Physical Exam  Constitutional: She is oriented to person, place, and time. She appears well-developed and well-nourished.  HENT:  Head: Normocephalic.  Neck: Normal range of motion.  Respiratory: Effort normal.  Musculoskeletal: Normal range of motion.  Neurological: She is alert and oriented to person, place, and time.  Skin: Skin is warm and dry.  Psychiatric: Her speech is normal and behavior is normal. Judgment and thought content normal. Cognition and memory are normal. She exhibits a depressed mood.     Review of Systems  Constitutional: Negative.   HENT: Negative.   Eyes: Negative.   Respiratory: Negative.   Cardiovascular: Negative.   Gastrointestinal: Negative.   Genitourinary: Negative.   Musculoskeletal: Negative.   Skin: Negative.   Neurological: Negative.   Endo/Heme/Allergies: Negative.   Psychiatric/Behavioral: Positive for depression.    Blood pressure 113/74, pulse 81, temperature 98.3 F (36.8 C), temperature source Oral, resp. rate 18, last menstrual period 02/25/2016, SpO2 97 %.There is no height or weight on file to calculate BMI.  General Appearance: Casual  Eye Contact:  Good  Speech:  Normal Rate  Volume:  Normal  Mood:  Depressed  Affect:  Blunt  Thought Process:  Coherent and Descriptions of Associations: Intact  Orientation:  Full (Time, Place, and Person)  Thought Content:  WDL  Suicidal Thoughts:  No  Homicidal Thoughts:  No  Memory:  Immediate;   Good Recent;   Good Remote;   Good  Judgement:  Fair  Insight:  Fair  Psychomotor Activity:  Normal  Concentration:  Concentration: Good and Attention Span: Good  Recall:  Good  Fund of Knowledge:  Good  Language:  Good  Akathisia:  No  Handed:  Right  AIMS (if indicated):     Assets:  Leisure Time Physical Health Resilience  ADL's:  Intact  Cognition:  WNL  Sleep:        Treatment Plan Summary: Daily contact with patient to assess and evaluate symptoms and progress in treatment, Medication management and Plan schizoaffective disorder, bipolar type:  -Crisis stabilization -Medication management:  No medications restarted due to limited time in the ED -Individual counseling  Disposition: No evidence of imminent risk to self or others at present.    Waylan Boga, NP 03/13/2016 10:07 AM

## 2016-03-13 NOTE — [facility] Suicide Risk Assessment (Signed)
Suicide Risk Assessment  Discharge Assessment   [facility]   Principal Problem: Schizoaffective disorder, bipolar type [facility](HCC) Discharge Diagnoses:  Patient Active Problem List   Diagnosis Date Noted  . Schizoaffective disorder, bipolar type ([facility]) [F25.0] 02/17/2016    Priority: High  . Akathisia [R45.0] 03/03/2016  . Hypothyroidism [E03.9] 02/21/2016  . Bilateral edema of lower extremity [R60.0] 02/21/2016    Total Time spent with patient: 45 minutes  Musculoskeletal: Strength & Muscle Tone: within normal limits Gait & Station: normal Patient leans: N/A  Psychiatric Specialty Exam: Physical Exam  Constitutional: She is oriented to person, place, and time. She appears well-developed and well-nourished.  HENT:  Head: Normocephalic.  Neck: Normal range of motion.  Respiratory: Effort normal.  Musculoskeletal: Normal range of motion.  Neurological: She is alert and oriented to person, place, and time.  Skin: Skin is warm and dry.  Psychiatric: Her speech is normal and behavior is normal. Judgment and thought content normal. Cognition and memory are normal. She exhibits a depressed mood.    Review of Systems  Constitutional: Negative.   HENT: Negative.   Eyes: Negative.   Respiratory: Negative.   Cardiovascular: Negative.   Gastrointestinal: Negative.   Genitourinary: Negative.   Musculoskeletal: Negative.   Skin: Negative.   Neurological: Negative.   Endo/Heme/Allergies: Negative.   Psychiatric/Behavioral: Positive for depression.    Blood pressure 113/74, pulse 81, temperature 98.3 F (36.8 C), temperature source Oral, resp. rate 18, last menstrual period 02/25/2016, SpO2 97 %.There is no height or weight on file to calculate BMI.  General Appearance: Casual  Eye Contact:  Good  Speech:  Normal Rate  Volume:  Normal  Mood:  Depressed  Affect:  Blunt  Thought Process:  Coherent and Descriptions of Associations: Intact  Orientation:  Full  (Time, Place, and Person)  Thought Content:  WDL  Suicidal Thoughts:  No  Homicidal Thoughts:  No  Memory:  Immediate;   Good Recent;   Good Remote;   Good  Judgement:  Fair  Insight:  Fair  Psychomotor Activity:  Normal  Concentration:  Concentration: Good and Attention Span: Good  Recall:  Good  Fund of Knowledge:  Good  Language:  Good  Akathisia:  No  Handed:  Right  AIMS (if indicated):     Assets:  Leisure Time Physical Health Resilience  ADL's:  Intact  Cognition:  WNL  Sleep:      Mental Status Per Nursing Assessment::   On Admission:   depression and physical complaints  Demographic Factors:  Caucasian and Living alone  Loss Factors: NA  Historical Factors: NA  Risk Reduction Factors:   Positive therapeutic relationship  Continued Clinical Symptoms:  Depression, mild  Cognitive Features That Contribute To Risk:  None    Suicide Risk:  Minimal: No identifiable suicidal ideation.  Patients presenting with no risk factors but with morbid ruminations; may be classified as minimal risk based on the severity of the depressive symptoms    Plan Of Care/Follow-up recommendations:  Activity:  as tolerated Diet:  heart healthy diet  Darah Simkin, NP 03/13/2016, 11:03 AM

## 2016-03-13 NOTE — ED Notes (Signed)
At discharge pt states "they did not address my physical problems that I came in with. That is why EMS brought me." Pt states left hip hurting for four days and right heal is cracked.

## 2016-03-14 ENCOUNTER — Other Ambulatory Visit: Payer: Self-pay

## 2016-03-14 NOTE — ED Notes (Signed)
Pt ambulatory and independent at discharge.  Verbalized understanding of discharge instructions 

## 2016-03-14 NOTE — ED Provider Notes (Signed)
[facility]-EMERGENCY DEPT Provider Note   CSN: [id number] Arrival date & time: 03/13/16  2220  By signing my name below, I, Rosario Adie, attest that this documentation has been prepared under the direction and in the presence of Tomasita Crumble, MD. Electronically Signed: Rosario Adie, ED Scribe. 03/14/16. 2:22 AM.  History   Chief Complaint Chief Complaint  Patient presents with  . Fatigue  . Chest Pain   The history is provided by the patient. No language interpreter was used.    HPI Comments: Meghan Hensley is a 45 y.o. female BIB [facility], with a PMhx of Bipolar 1 disorder, Schizophrenia, and Hypothyroidism, who presents to the Emergency Department complaining of sudden onset, unchanged, intermittent, sharp and dull, mid-sternal chest pain onset earlier today PTA. Pt reports that she has associated numbness in her bilateral hands x " a couple of weeks", several episodes of emesis, generalized fatigue, and cramping to her bilateral LEs x ~2 months. Her chest pain is exacerbated with twisting. No alleviating factors noted. No other associated symptoms or complaints.  Past Medical History:  Diagnosis Date  . Bipolar 1 disorder (H[facility])   . Schizophrenia ([facility])   . Thyroid disease    Patient Active Problem List   Diagnosis Date Noted  . Akathisia 03/03/2016  . Hypothyroidism 02/21/2016  . Bilateral edema of lower extremity 02/21/2016  . Schizoaffective disorder, bipolar type (H[facility]) 02/17/2016   No past surgical history on file.  OB History    No data available     Home Medications    Prior to Admission medications   Medication Sig Start Date End Date Taking? Authorizing Provider  antiseptic oral rinse (BIOTENE) LIQD 15 mLs by Mouth Rinse route as needed for dry mouth. 03/06/16   Oneta Rack, NP  ARIPiprazole (ABILIFY) 5 MG tablet Take 1 tablet (5 mg total) by mouth 2 (two) times daily in the am and at bedtime.. 03/06/16   Oneta Rack, NP  divalproex (DEPAKOTE ER) 500  MG 24 hr tablet Take 2 tablets (1,000 mg total) by mouth at bedtime. 03/06/16   Oneta Rack, NP  gabapentin (NEURONTIN) 100 MG capsule Take 2 capsules (200 mg total) by mouth 2 (two) times daily. 03/06/16   Oneta Rack, NP  levothyroxine (SYNTHROID, LEVOTHROID) 125 MCG tablet Take 2 tablets (250 mcg total) by mouth daily before breakfast. 03/06/16   Oneta Rack, NP  pantoprazole (PROTONIX) 40 MG tablet Take 1 tablet (40 mg total) by mouth 2 (two) times daily before a meal. 02/24/16   Adonis Brook, NP  traZODone (DESYREL) 50 MG tablet Take 1 tablet (50 mg total) by mouth at bedtime and may repeat dose one time if needed. 03/06/16   Oneta Rack, NP  trihexyphenidyl (ARTANE) 2 MG tablet Take 1 tablet (2 mg total) by mouth at bedtime. 03/06/16   Oneta Rack, NP   Family History Family History  Problem Relation Age of Onset  . Cancer Father   . Mental illness Neg Hx    Social History Social History  Substance Use Topics  . Smoking status: Former Games developer  . Smokeless tobacco: Never Used  . Alcohol use No   Allergies   Review of patient's allergies indicates no known allergies.  Review of Systems Review of Systems 10 Systems reviewed and all are negative for acute change except as noted in the HPI. Physical Exam Updated Vital Signs BP 127/69 (BP Location: Left Arm)   Pulse 90   Temp 97.9  F (36.6 C) (Oral)   Resp 14   LMP 02/25/2016 (Exact Date)   SpO2 97%   Physical Exam  Constitutional: She is oriented to person, place, and time. She appears well-developed and well-nourished. No distress.  HENT:  Head: Normocephalic and atraumatic.  Nose: Nose normal.  Mouth/Throat: Oropharynx is clear and moist. No oropharyngeal exudate.  Eyes: Conjunctivae and EOM are normal. Pupils are equal, round, and reactive to light. No scleral icterus.  Neck: Normal range of motion. Neck supple. No JVD present. No tracheal deviation present. No thyromegaly present.  Cardiovascular: Normal rate,  regular rhythm and normal heart sounds.  Exam reveals no gallop and no friction rub.   No murmur heard. Pulmonary/Chest: Effort normal and breath sounds normal. No respiratory distress. She has no wheezes. She exhibits no tenderness.  Abdominal: Soft. Bowel sounds are normal. She exhibits no distension and no mass. There is no tenderness. There is no rebound and no guarding.  Musculoskeletal: Normal range of motion. She exhibits no edema or tenderness.  Lymphadenopathy:    She has no cervical adenopathy.  Neurological: She is alert and oriented to person, place, and time. No cranial nerve deficit. She exhibits normal muscle tone.  Skin: Skin is warm and dry. No rash noted. No erythema. No pallor.  Nursing note and vitals reviewed.  ED Treatments / Results  DIAGNOSTIC STUDIES: Oxygen Saturation is 97% on RA, normal by my interpretation.   COORDINATION OF CARE: 2:22 AM-Discussed next steps with pt including cardiac work-up. Pt verbalized understanding and is agreeable with the plan.   Labs (all labs ordered are listed, but only abnormal results are displayed) Labs Reviewed  CBC - Abnormal; Notable for the following:       Result Value   Hemoglobin 11.4 (*)    RDW 15.7 (*)    All other components within normal limits  BASIC METABOLIC PANEL  I-STAT TROPOININ, ED    EKG  EKG Interpretation None       Radiology Dg Chest 2 View  Result Date: 03/13/2016 CLINICAL DATA:  45 y/o  F; patient is confused and anxious. EXAM: CHEST  2 VIEW COMPARISON:  Chest radiograph dated 01/30/2011. FINDINGS: The heart size and mediastinal contours are within normal limits. Both lungs are clear. Mild multilevel degenerative changes of the thoracic spine. No acute osseous abnormality is identified. IMPRESSION: No active cardiopulmonary disease. Electronically Signed   By: Mitzi HansenLance  Furusawa-Stratton M.D.   On: 03/13/2016 23:08    Procedures Procedures (including critical care time)  Medications  Ordered in ED Medications - No data to display   Initial Impression / Assessment and Plan / ED Course  I have reviewed the triage vital signs and the nursing notes.  Pertinent labs & imaging results that were available during my care of the patient were reviewed by me and considered in my medical decision making (see chart for details).  Clinical Course   Patient presents to the ED for chest pain.  Her history is not consistent with ACS, dissection, or pericarditis.  She is PERC negative.  She was just seen in the ED for multiple complaints and SI.  She was medically cleared and cleared by psych at that time.  Troponin was ordered by triage and not clinically indicated.  It was negative.  EKG is normal and unchanged from previous, it is not crossing over from MUSE.  She appears well and in NAD.  PCP fu advised within 3 days.  VS remain within her normal limits  and she is safe for DC.   Final Clinical Impressions(s) / ED Diagnoses   Final diagnoses:  None    New Prescriptions New Prescriptions   No medications on file    I personally performed the services described in this documentation, which was scribed in my presence. The recorded information has been reviewed and is accurate.       Tomasita CrumbleAdeleke Shanisha Lech, MD 03/14/16 [phone]

## 2016-12-09 ENCOUNTER — Ambulatory Visit (HOSPITAL_COMMUNITY)
Admission: AD | Admit: 2016-12-09 | Discharge: 2016-12-09 | Disposition: A | Discharge status: Home / Self Care | Payer: Medicare Other | Attending: Psychiatry | Admitting: Psychiatry

## 2016-12-09 NOTE — BH Assessment (Signed)
Walk-In Assessment Note  Meghan Hensley is an 46 y.o. female that denies SI/HI/Psychosis/Substance Abuse.   Patient reports that she wants her psychiatric medication changed.  Patient reports that her PCP that manages the thyroid condition also prescribes her psychiatric medication.  Patient reports that this medication is causing her to be constipated.   Patient reports that her thyroid diseased caused her to feel depressed at times.  Patient reports that she lives alone and she is able to go back to her PCP so that he is able to adjust her psychiatric medication.   Per Jacki Cones, NP - patient does not meet criteria for inpatient hospitalization.  Patient will be able to follow up with her current PCP.  Writer also provided the patient with a list of psychiatrist that would be able to manager her psychiatric medication management.      Diagnosis: Bipolar 1 Disorder and Schizophrenia   Past Medical History:  Past Medical History:  Diagnosis Date  . Bipolar 1 disorder (HCC)   . Schizophrenia (HCC)   . Thyroid disease     No past surgical history on file.  Family History:  Family History  Problem Relation Age of Onset  . Cancer Father   . Mental illness Neg Hx     Social History:  reports that she has quit smoking. She has never used smokeless tobacco. She reports that she does not drink alcohol or use drugs.  Additional Social History:  Alcohol / Drug Use History of alcohol / drug use?: No history of alcohol / drug abuse  CIWA: CIWA-Ar BP: 120/90 Pulse Rate: 91 COWS:    Allergies: No Known Allergies  Home Medications:  (Not in a hospital admission)  OB/GYN Status:  No LMP recorded. Patient is not currently having periods (Reason: Other).  General Assessment Data Location of Assessment: [facility] (Walk In at [facility]) TTS Assessment: In system Is this a Tele or Face-to-Face Assessment?: Face-to-Face Is this an Initial Assessment or a Re-assessment for this  encounter?: Initial Assessment Marital status: Divorced [place] name: NA Is patient pregnant?: No Pregnancy Status: No Living Arrangements: Alone Can pt return to current living arrangement?: Yes Admission Status: Voluntary Is patient capable of signing voluntary admission?: Yes Referral Source: Self/Family/Friend Insurance type: Medicare  Medical Screening Exam [facility] Walk-in ONLY) Medical Exam completed: Yes Jacki Cones, NP - completed MSE Exam)  Crisis Care Plan Living Arrangements: Alone Legal Guardian:  (NA) Name of Psychiatrist: None Reported Name of Therapist: None Reported  Education Status Is patient currently in school?: No Current Grade: NA Highest grade of school patient has completed: NA Name of school: NA Contact person: NA  Risk to self with the past 6 months Suicidal Ideation: No Has patient been a risk to self within the past 6 months prior to admission? : No Suicidal Intent: No Has patient had any suicidal intent within the past 6 months prior to admission? : No Is patient at risk for suicide?: No Suicidal Plan?: No Has patient had any suicidal plan within the past 6 months prior to admission? : No Access to Means: No What has been your use of drugs/alcohol within the last 12 months?: NA Previous Attempts/Gestures: Yes (20 years ago) How many times?: 1 Other Self Harm Risks: None Reported Triggers for Past Attempts: Unpredictable Intentional Self Injurious Behavior: None Family Suicide History: No Recent stressful life event(s): Other (Comment) (Problems with her thyroid medication) Persecutory voices/beliefs?: No Depression: Yes Depression Symptoms: Despondent, Isolating, Loss of interest in usual pleasures,  Feeling worthless/self pity Substance abuse history and/or treatment for substance abuse?: No Suicide prevention information given to non-admitted patients: Not applicable  Risk to Others within the past 6 months Homicidal Ideation: No Does patient  have any lifetime risk of violence toward others beyond the six months prior to admission? : No Thoughts of Harm to Others: No Current Homicidal Intent: No Current Homicidal Plan: No Access to Homicidal Means: No Identified Victim: NA History of harm to others?: No Assessment of Violence: None Noted Violent Behavior Description: NA Does patient have access to weapons?: No Criminal Charges Pending?: No Does patient have a court date: No Is patient on probation?: No  Psychosis Hallucinations: None noted Delusions: None noted  Mental Status Report Appearance/Hygiene: Disheveled Eye Contact: Fair Motor Activity: Freedom of movement Speech: Logical/coherent Level of Consciousness: Alert Mood: Depressed Affect: Appropriate to circumstance Anxiety Level: None Thought Processes: Coherent, Relevant Judgement: Unimpaired Orientation: Person, Place, Time, Situation Obsessive Compulsive Thoughts/Behaviors: None  Cognitive Functioning Concentration: Normal Memory: Recent Intact, Remote Intact IQ: Average Insight: Good Impulse Control: Good Appetite: Fair Weight Loss: 0 Weight Gain: 20 Sleep: Increased Total Hours of Sleep: 9 Vegetative Symptoms: Decreased grooming, Staying in bed  ADLScreening [facility] Assessment Services) Patient's cognitive ability adequate to safely complete daily activities?: Yes Patient able to express need for assistance with ADLs?: Yes Independently performs ADLs?: Yes (appropriate for developmental age)  Prior Inpatient Therapy Prior Inpatient Therapy: Yes Prior Therapy Dates: 1998 Prior Therapy Facilty/Provider(s): Unable to remember the name Reason for Treatment: Overdose  Prior Outpatient Therapy Prior Outpatient Therapy: Yes Prior Therapy Dates: 2016 Prior Therapy Facilty/Provider(s): Unable to remember the name  Reason for Treatment: Outpatient Therapy Does patient have an ACCT team?: No Does patient have Intensive In-House Services?  :  No Does patient have [facility] services? : No Does patient have P4CC services?: No  ADL Screening (condition at time of admission) Patient's cognitive ability adequate to safely complete daily activities?: Yes Is the patient deaf or have difficulty hearing?: No Does the patient have difficulty seeing, even when wearing glasses/contacts?: No Does the patient have difficulty concentrating, remembering, or making decisions?: No Patient able to express need for assistance with ADLs?: Yes Does the patient have difficulty dressing or bathing?: No Independently performs ADLs?: Yes (appropriate for developmental age) Does the patient have difficulty walking or climbing stairs?: No Weakness of Legs: None Weakness of Arms/Hands: None  Home Assistive Devices/Equipment Home Assistive Devices/Equipment: None    Abuse/Neglect Assessment (Assessment to be complete while patient is alone) Physical Abuse: Denies Verbal Abuse: Yes, past (Comment) Sexual Abuse: Denies Exploitation of patient/patient's resources: Denies Self-Neglect: Denies Values / Beliefs Cultural Requests During Hospitalization: None Spiritual Requests During Hospitalization: None Consults Spiritual Care Consult Needed: No Social Work Consult Needed: No Merchant navy officer (For Healthcare) Does Patient Have a Medical Advance Directive?: No Would patient like information on creating a medical advance directive?: No - Patient declined    Additional Information 1:1 In Past 12 Months?: No CIRT Risk: No Elopement Risk: No Does patient have medical clearance?: Yes     Disposition: Per Jacki Cones, NP - patient does not meet criteria for inpatient hospitalization.  Patient will be able to follow up with her current PCP.  Writer also provided the patient with a list of psychiatrist that would be able to manager her psychiatric medication management.       Disposition Initial Assessment Completed for this Encounter: Yes Disposition  of Patient: Outpatient treatment  On Site Evaluation by:  Reviewed with Physician:    Phillip HealStevenson, Tracey Stewart La[place]ne 12/09/2016 3:11 PM

## 2016-12-09 NOTE — H&P (Signed)
Behavioral Health Medical Screening Exam  Meghan Hensley is an 46 y.o. female.  Total Time spent with patient: 20 minutes  Psychiatric Specialty Exam: Physical Exam  Constitutional: She is oriented to person, place, and time. She appears well-developed and well-nourished.  HENT:  Head: Normocephalic.  Right Ear: External ear normal.  Left Ear: External ear normal.  Neck: Normal range of motion.  Cardiovascular: Normal rate, normal heart sounds and intact distal pulses.   Respiratory: Effort normal and breath sounds normal.  GI: Soft. Bowel sounds are normal.  Musculoskeletal: Normal range of motion.  Neurological: She is alert and oriented to person, place, and time.  Skin: Skin is warm and dry.    Review of Systems  Psychiatric/Behavioral: Positive for depression and suicidal ideas (passive). Negative for hallucinations, memory loss and substance abuse. The patient is not nervous/anxious and does not have insomnia.   All other systems reviewed and are negative.   Blood pressure 120/90, pulse 91, temperature 98.6 F (37 C), temperature source Oral, resp. rate 18, SpO2 96 %.There is no height or weight on file to calculate BMI.  General Appearance: Disheveled  Eye Contact:  Fair  Speech:  Clear and Coherent and Normal Rate  Volume:  Normal  Mood:  Depressed  Affect:  Congruent, Depressed and Flat  Thought Process:  Coherent and Linear  Orientation:  Full (Time, Place, and Person)  Thought Content:  Logical  Suicidal Thoughts:  No  Homicidal Thoughts:  No  Memory:  Immediate;   Good Recent;   Good Remote;   Fair  Judgement:  Good  Insight:  Fair  Psychomotor Activity:  Normal  Concentration: Concentration: Good and Attention Span: Good  Recall:  Good  Fund of Knowledge:Good  Language: Good  Akathisia:  No  Handed:  Right  AIMS (if indicated):     Assets:  Communication Skills Desire for Improvement Financial  Resources/Insurance Housing Resilience Transportation Vocational/Educational  Sleep:       Musculoskeletal: Strength & Muscle Tone: within normal limits Gait & Station: normal Patient leans: N/A  Blood pressure 120/90, pulse 91, temperature 98.6 F (37 C), temperature source Oral, resp. rate 18, SpO2 96 %.  Recommendations:  Based on my evaluation the patient does not appear to have an emergency medical condition.  Laveda AbbeLaurie Britton Parks, NP 12/09/2016, 1:40 PM

## 2017-03-16 DIAGNOSIS — Z79899 Other long term (current) drug therapy: Secondary | ICD-10-CM | POA: Diagnosis not present

## 2017-03-16 DIAGNOSIS — R531 Weakness: Secondary | ICD-10-CM | POA: Diagnosis not present

## 2017-03-16 DIAGNOSIS — Z87891 Personal history of nicotine dependence: Secondary | ICD-10-CM | POA: Diagnosis not present

## 2017-03-16 DIAGNOSIS — E039 Hypothyroidism, unspecified: Secondary | ICD-10-CM | POA: Insufficient documentation

## 2017-03-16 DIAGNOSIS — R45851 Suicidal ideations: Secondary | ICD-10-CM | POA: Insufficient documentation

## 2017-03-16 DIAGNOSIS — F332 Major depressive disorder, recurrent severe without psychotic features: Secondary | ICD-10-CM | POA: Diagnosis not present

## 2017-03-16 NOTE — ED Triage Notes (Signed)
Patient states she has a hx of thyroid and has been taking her medication irregular. Patient states that she feels like she want to hurts herself by hurting herself with a knife and a gun. Patient states she has been forcing herself to eat the last 6 weeks.

## 2017-03-17 ENCOUNTER — Inpatient Hospital Stay (HOSPITAL_COMMUNITY)
Admission: AD | Admit: 2017-03-17 | Discharge: 2017-03-23 | DRG: 885 | Disposition: A | Discharge status: Home / Self Care | Payer: Medicare Other | Source: Intra-hospital | Attending: Psychiatry | Admitting: Psychiatry

## 2017-03-17 ENCOUNTER — Encounter (HOSPITAL_COMMUNITY): Payer: Self-pay | Admitting: Emergency Medicine

## 2017-03-17 ENCOUNTER — Emergency Department (HOSPITAL_COMMUNITY)
Admission: EM | Admit: 2017-03-17 | Discharge: 2017-03-17 | Disposition: A | Discharge status: Other Acute Inpatient Hospital | Payer: Medicare Other | Attending: Emergency Medicine | Admitting: Emergency Medicine

## 2017-03-17 DIAGNOSIS — F250 Schizoaffective disorder, bipolar type: Principal | ICD-10-CM | POA: Diagnosis present

## 2017-03-17 DIAGNOSIS — Z79899 Other long term (current) drug therapy: Secondary | ICD-10-CM

## 2017-03-17 DIAGNOSIS — E039 Hypothyroidism, unspecified: Secondary | ICD-10-CM | POA: Diagnosis present

## 2017-03-17 DIAGNOSIS — K219 Gastro-esophageal reflux disease without esophagitis: Secondary | ICD-10-CM | POA: Diagnosis present

## 2017-03-17 DIAGNOSIS — R4584 Anhedonia: Secondary | ICD-10-CM | POA: Diagnosis not present

## 2017-03-17 DIAGNOSIS — R531 Weakness: Secondary | ICD-10-CM | POA: Diagnosis not present

## 2017-03-17 DIAGNOSIS — Z639 Problem related to primary support group, unspecified: Secondary | ICD-10-CM | POA: Diagnosis not present

## 2017-03-17 DIAGNOSIS — G2571 Drug induced akathisia: Secondary | ICD-10-CM | POA: Diagnosis not present

## 2017-03-17 DIAGNOSIS — Z9114 Patient's other noncompliance with medication regimen: Secondary | ICD-10-CM

## 2017-03-17 DIAGNOSIS — Z87891 Personal history of nicotine dependence: Secondary | ICD-10-CM

## 2017-03-17 DIAGNOSIS — R600 Localized edema: Secondary | ICD-10-CM | POA: Diagnosis not present

## 2017-03-17 DIAGNOSIS — G4700 Insomnia, unspecified: Secondary | ICD-10-CM | POA: Diagnosis present

## 2017-03-17 DIAGNOSIS — F4310 Post-traumatic stress disorder, unspecified: Secondary | ICD-10-CM | POA: Diagnosis present

## 2017-03-17 DIAGNOSIS — R45851 Suicidal ideations: Secondary | ICD-10-CM | POA: Diagnosis present

## 2017-03-17 DIAGNOSIS — F419 Anxiety disorder, unspecified: Secondary | ICD-10-CM | POA: Diagnosis not present

## 2017-03-17 DIAGNOSIS — F39 Unspecified mood [affective] disorder: Secondary | ICD-10-CM | POA: Diagnosis not present

## 2017-03-17 DIAGNOSIS — F332 Major depressive disorder, recurrent severe without psychotic features: Secondary | ICD-10-CM

## 2017-03-17 DIAGNOSIS — R5383 Other fatigue: Secondary | ICD-10-CM | POA: Diagnosis not present

## 2017-03-17 LAB — COMPREHENSIVE METABOLIC PANEL
ALK PHOS: 61 U/L (ref 38–126)
ALT: 23 U/L (ref 14–54)
ANION GAP: 8 (ref 5–15)
AST: 23 U/L (ref 15–41)
Albumin: 3.6 g/dL (ref 3.5–5.0)
BILIRUBIN TOTAL: 0.7 mg/dL (ref 0.3–1.2)
BUN: 7 mg/dL (ref 6–20)
CALCIUM: 8.7 mg/dL — AB (ref 8.9–10.3)
CO2: 26 mmol/L (ref 22–32)
CREATININE: 1 mg/dL (ref 0.44–1.00)
Chloride: 104 mmol/L (ref 101–111)
Glucose, Bld: 100 mg/dL — ABNORMAL HIGH (ref 65–99)
Potassium: 3.9 mmol/L (ref 3.5–5.1)
Sodium: 138 mmol/L (ref 135–145)
TOTAL PROTEIN: 7.8 g/dL (ref 6.5–8.1)

## 2017-03-17 LAB — CBC
HCT: 36.9 % (ref 36.0–46.0)
HEMOGLOBIN: 12 g/dL (ref 12.0–15.0)
MCH: 27.2 pg (ref 26.0–34.0)
MCHC: 32.5 g/dL (ref 30.0–36.0)
MCV: 83.7 fL (ref 78.0–100.0)
Platelets: 335 10*3/uL (ref 150–400)
RBC: 4.41 MIL/uL (ref 3.87–5.11)
RDW: 15.9 % — AB (ref 11.5–15.5)
WBC: 10.5 10*3/uL (ref 4.0–10.5)

## 2017-03-17 LAB — ETHANOL

## 2017-03-17 LAB — I-STAT BETA HCG BLOOD, ED (MC, WL, AP ONLY): I-stat hCG, quantitative: <5 m[IU]/mL (ref <5)

## 2017-03-17 LAB — RAPID URINE DRUG SCREEN, HOSP PERFORMED
Amphetamines: NOT DETECTED
Barbiturates: NOT DETECTED
Benzodiazepines: NOT DETECTED
COCAINE: NOT DETECTED
OPIATES: NOT DETECTED
TETRAHYDROCANNABINOL: NOT DETECTED

## 2017-03-17 LAB — TSH: TSH: 281.926 u[IU]/mL — AB (ref 0.350–4.500)

## 2017-03-17 LAB — SALICYLATE LEVEL

## 2017-03-17 LAB — VALPROIC ACID LEVEL

## 2017-03-17 LAB — ACETAMINOPHEN LEVEL

## 2017-03-17 MED ORDER — MAGNESIUM HYDROXIDE 400 MG/5ML PO SUSP: 30.0000 mL | Freq: Every day | ORAL | Status: DC | PRN

## 2017-03-17 MED ORDER — ZIPRASIDONE MESYLATE 20 MG IM SOLR: 20.0000 mg | INTRAMUSCULAR | Status: DC | PRN

## 2017-03-17 MED ORDER — TRAZODONE HCL 50 MG PO TABS: 50.0000 mg | ORAL_TABLET | Freq: Every evening | ORAL | Status: DC | PRN

## 2017-03-17 MED ORDER — TRAZODONE HCL 50 MG PO TABS
50.0000 mg | ORAL_TABLET | Freq: Every evening | ORAL | Status: DC | PRN
  Administered 2017-03-18: 50 mg via ORAL
  Filled 2017-03-17 (×5): qty 1

## 2017-03-17 MED ORDER — RISPERIDONE 1 MG PO TBDP
2.0000 mg | ORAL_TABLET | Freq: Three times a day (TID) | ORAL | Status: DC | PRN
Start: 2017-03-17 — End: 2017-03-17
  Filled 2017-03-17: qty 2

## 2017-03-17 MED ORDER — GABAPENTIN 100 MG PO CAPS
200.0000 mg | ORAL_CAPSULE | Freq: Two times a day (BID) | ORAL | Status: DC
  Administered 2017-03-17 (×2): 200 mg via ORAL
  Filled 2017-03-17 (×2): qty 2

## 2017-03-17 MED ORDER — IBUPROFEN 600 MG PO TABS
600.0000 mg | ORAL_TABLET | Freq: Three times a day (TID) | ORAL | Status: DC | PRN
  Administered 2017-03-20: 600 mg via ORAL
  Filled 2017-03-17: qty 1

## 2017-03-17 MED ORDER — RISPERIDONE 2 MG PO TBDP: 2.0000 mg | ORAL_TABLET | Freq: Three times a day (TID) | ORAL | Status: DC | PRN

## 2017-03-17 MED ORDER — LEVOTHYROXINE SODIUM 125 MCG PO TABS
250.0000 ug | ORAL_TABLET | Freq: Every day | ORAL | Status: DC
  Administered 2017-03-18 – 2017-03-23 (×6): 250 ug via ORAL
  Filled 2017-03-17 (×9): qty 2

## 2017-03-17 MED ORDER — LEVOTHYROXINE SODIUM 125 MCG PO TABS
250.0000 ug | ORAL_TABLET | Freq: Every day | ORAL | Status: DC
  Administered 2017-03-17: 250 ug via ORAL
  Filled 2017-03-17 (×2): qty 2

## 2017-03-17 MED ORDER — ZOLPIDEM TARTRATE 5 MG PO TABS: 5.0000 mg | ORAL_TABLET | Freq: Every evening | ORAL | Status: DC | PRN

## 2017-03-17 MED ORDER — ACETAMINOPHEN 325 MG PO TABS
650.0000 mg | ORAL_TABLET | Freq: Four times a day (QID) | ORAL | Status: DC | PRN
  Administered 2017-03-19 – 2017-03-20 (×5): 650 mg via ORAL
  Filled 2017-03-17 (×5): qty 2

## 2017-03-17 MED ORDER — TRIHEXYPHENIDYL HCL 2 MG PO TABS
2.0000 mg | ORAL_TABLET | Freq: Every day | ORAL | Status: DC
  Administered 2017-03-18 – 2017-03-22 (×5): 2 mg via ORAL
  Filled 2017-03-17 (×8): qty 1

## 2017-03-17 MED ORDER — ARIPIPRAZOLE 5 MG PO TABS
5.0000 mg | ORAL_TABLET | ORAL | Status: DC
  Filled 2017-03-17 (×3): qty 1

## 2017-03-17 MED ORDER — PANTOPRAZOLE SODIUM 40 MG PO TBEC
40.0000 mg | DELAYED_RELEASE_TABLET | Freq: Two times a day (BID) | ORAL | Status: DC
  Administered 2017-03-18 – 2017-03-23 (×11): 40 mg via ORAL
  Filled 2017-03-17 (×17): qty 1

## 2017-03-17 MED ORDER — ALUM & MAG HYDROXIDE-SIMETH 200-200-20 MG/5ML PO SUSP
30.0000 mL | ORAL | Status: DC | PRN
  Administered 2017-03-18: 30 mL via ORAL
  Filled 2017-03-17: qty 30

## 2017-03-17 MED ORDER — PANTOPRAZOLE SODIUM 40 MG PO TBEC
40.0000 mg | DELAYED_RELEASE_TABLET | Freq: Two times a day (BID) | ORAL | Status: DC
  Administered 2017-03-17 (×2): 40 mg via ORAL
  Filled 2017-03-17 (×2): qty 1

## 2017-03-17 MED ORDER — TRAZODONE HCL 50 MG PO TABS
50.0000 mg | ORAL_TABLET | Freq: Every evening | ORAL | Status: DC | PRN
  Administered 2017-03-17: 50 mg via ORAL
  Filled 2017-03-17: qty 1

## 2017-03-17 MED ORDER — DIVALPROEX SODIUM ER 500 MG PO TB24
1000.0000 mg | ORAL_TABLET | Freq: Every day | ORAL | Status: DC
  Administered 2017-03-17: 1000 mg via ORAL
  Filled 2017-03-17: qty 2

## 2017-03-17 MED ORDER — IBUPROFEN 200 MG PO TABS: 600.0000 mg | ORAL_TABLET | Freq: Three times a day (TID) | ORAL | Status: DC | PRN

## 2017-03-17 MED ORDER — GABAPENTIN 100 MG PO CAPS
200.0000 mg | ORAL_CAPSULE | Freq: Two times a day (BID) | ORAL | Status: DC
  Filled 2017-03-17 (×3): qty 2

## 2017-03-17 MED ORDER — DIVALPROEX SODIUM ER 500 MG PO TB24
1000.0000 mg | ORAL_TABLET | Freq: Every day | ORAL | Status: DC
  Filled 2017-03-17: qty 2

## 2017-03-17 MED ORDER — ARIPIPRAZOLE 5 MG PO TABS
5.0000 mg | ORAL_TABLET | ORAL | Status: DC
  Administered 2017-03-17 (×2): 5 mg via ORAL
  Filled 2017-03-17 (×2): qty 1

## 2017-03-17 MED ORDER — HYDROXYZINE HCL 25 MG PO TABS
25.0000 mg | ORAL_TABLET | Freq: Four times a day (QID) | ORAL | Status: DC | PRN
  Administered 2017-03-19 – 2017-03-22 (×4): 25 mg via ORAL
  Filled 2017-03-17 (×4): qty 1

## 2017-03-17 MED ORDER — TRIHEXYPHENIDYL HCL 2 MG PO TABS
2.0000 mg | ORAL_TABLET | Freq: Every day | ORAL | Status: DC
  Administered 2017-03-17: 2 mg via ORAL
  Filled 2017-03-17: qty 1

## 2017-03-17 MED ORDER — LORAZEPAM 1 MG PO TABS: 1.0000 mg | ORAL_TABLET | ORAL | Status: DC | PRN

## 2017-03-17 NOTE — ED Notes (Signed)
Pt observed in bed majority of the shift. Pt compliant with medication regimen. Pt irritable at times. Thought blocking noted . Pt endorsing depression. Pt endorsing passive SI. Encouragement and support provided. Special checks q 15 mins in place for safety, Video monitoring in place. Will continue to monitor.

## 2017-03-17 NOTE — ED Notes (Signed)
SBAR Report received from previous nurse. Pt received calm and visible on unit. Pt denies current SI/ HI, A/V H, depression, anxiety, or pain at this time, and appears otherwise stable and free of distress. Pt reminded of camera surveillance, q 15 min rounds, and rules of the milieu. Will continue to assess. 

## 2017-03-17 NOTE — Progress Notes (Signed)
Patient ID: Meghan Hensley, female   DOB: [birth year], 46 y.o.   MRN: [id number] Per State regulations 482.30 this chart was reviewed for medical necessity with respect to the patient's admission/duration of stay.    Next review date: 03/21/17  Thurman Coyer, BSN, RN-BC  Case Manager

## 2017-03-17 NOTE — BH Assessment (Addendum)
Tele Assessment Note   Meghan Hensley is an 46 y.o. female, who presents voluntary and unaccompanied to [facility]. Pt reported, she having very low energy for the past six weeks. Pt reported, she is a "thyroid person." Pt reported, having thoughts of wanting to hurt herself with a knife. Pt reported, she feels like people don't want her to be alive and she doesn't feel valuable. Pt reported, not grieving for her father who died two years ago from five different types of cancer. Pt reported, she has trouble eating. Pt reported, she has to push herself to eat 1-2 nibbles of food. Pt reported, hearing prayerful voices. During the assessment, clinician observed the pt continually discuss her thyroid, menstrual cycle, and her overall health needs (pap smear, assess knee pain, vision concerns, etc.) Pt reported, wanting to get checked out because she thinks she may have cancer.   Pt denies abuse. Pt's UDS is pending. Pt reported, she is linked to a psychiatrist and counselor in [place], [place]. Pt reported, wanting a psychiatrist, counselor and a primary care phyisician in [place], [place]. Pt reported, not taking her antidepressant as prescribed. Pt reported, previous inpatient admissions when she overdosed on OTC sleeping aids, 25 years ago.   Pt presents quiet/awake, bizarre in scrubs with loud logical/coherent speech. Pt's eye contact was poor. Pt's mood was depressed, helpless, preoccupied. Pt's affect was flat. Pt's thought process was coherent/tangental. Pt's judgement was partial. Pt's concentration was normal. Pt's insight and impulse control are fair. Pt's is x4 oriented (day, year, city and state.) Pt reported, if discharged from [facility] she could not contract for safety.   Diagnosis: Bipolar 1 Disorder (HCC)   Past Medical History:  Past Medical History:  Diagnosis Date  . Bipolar 1 disorder (HCC)   . Schizophrenia (HCC)   . Thyroid disease     History reviewed. No pertinent surgical history.  Family  History:  Family History  Problem Relation Age of Onset  . Cancer Father   . Mental illness Neg Hx     Social History:  reports that she has quit smoking. She has never used smokeless tobacco. She reports that she does not drink alcohol or use drugs.  Additional Social History:  Alcohol / Drug Use Pain Medications: See MAR Prescriptions: See MAR Over the Counter: See MAR History of alcohol / drug use?: No history of alcohol / drug abuse  CIWA: CIWA-Ar BP: 123/83 Pulse Rate: 88 COWS:    PATIENT STRENGTHS: (choose at least two) Average or above average intelligence General fund of knowledge  Allergies: No Known Allergies  Home Medications:  (Not in a hospital admission)  OB/GYN Status:  No LMP recorded. Patient is not currently having periods (Reason: Irregular Periods).  General Assessment Data [facility]: [facility] TTS Assessment: In system Is this a Tele or Face-to-Face Assessment?: Tele Assessment Is this an Initial Assessment or a Re-assessment for this encounter?: Initial Assessment Marital status: Single Living Arrangements: Alone Can pt return to current living arrangement?: Yes Admission Status: Voluntary Is patient capable of signing voluntary admission?: Yes Referral Source: Self/Family/Friend Insurance type: Medicare.      Crisis Care Plan Living Arrangements: Alone Legal Guardian: Other: (Self) Name of Psychiatrist: Pending. Name of Therapist: Pending.   Education Status Is patient currently in school?: No Current Grade: NA Highest grade of school patient has completed: Pt reported, four years of college.  Name of school: NA Contact person: NA  Risk to self with the past 6 months Suicidal Ideation:  Yes-Currently Present Has patient been a risk to self within the past 6 months prior to admission? : Yes Suicidal Intent: Yes-Currently Present Has patient had any suicidal intent within the past 6 months prior to admission? : Yes Is  patient at risk for suicide?: Yes Suicidal Plan?: Yes-Currently Present Has patient had any suicidal plan within the past 6 months prior to admission? : Yes Specify Current Suicidal Plan: Pt reported, to use a knife.  Access to Means: No (Pt denies. ) Specify Access to Suicidal Means: NA What has been your use of drugs/alcohol within the last 12 months?: Pt denies.  Previous Attempts/Gestures: Yes How many times?: 1 Other Self Harm Risks: Pt denies.  Triggers for Past Attempts: Unpredictable Intentional Self Injurious Behavior: None (Pt denies. ) Family Suicide History: No Recent stressful life event(s): Loss (Comment) (Pt's father died of five different types of cancers, not eat) Persecutory voices/beliefs?: No Depression: Yes Depression Symptoms: Guilt, Tearfulness, Despondent, Insomnia, Isolating, Loss of interest in usual pleasures, Fatigue, Feeling worthless/self pity Substance abuse history and/or treatment for substance abuse?: Yes (Per chart.) Suicide prevention information given to non-admitted patients: Not applicable  Risk to Others within the past 6 months Homicidal Ideation: No (Pt denies. ) Does patient have any lifetime risk of violence toward others beyond the six months prior to admission? : No Thoughts of Harm to Others: No Current Homicidal Intent: No Current Homicidal Plan: No Access to Homicidal Means: No Identified Victim: NA History of harm to others?: No Assessment of Violence: None Noted Violent Behavior Description: NA Does patient have access to weapons?: No ( ) Criminal Charges Pending?: No Does patient have a court date: No Is patient on probation?: No  Psychosis Hallucinations: Auditory Delusions: None noted  Mental Status Report Appearance/Hygiene: Bizarre, In scrubs Eye Contact: Poor Motor Activity: Unremarkable Speech: Logical/coherent, Loud Level of Consciousness: Quiet/awake Mood: Depressed, Helpless, Preoccupied Affect: Flat Anxiety  Level: Minimal Thought Processes: Coherent, Tangential Judgement: Partial Orientation: Other (Comment) (day, year, city and state.) Obsessive Compulsive Thoughts/Behaviors: None  Cognitive Functioning Concentration: Normal Memory: Recent Intact IQ: Average Insight: Fair Impulse Control: Fair Appetite: Poor Weight Loss: 0 Weight Gain:  (Pt reported, gaining 120 pounds in two years ) Sleep: Decreased Vegetative Symptoms: Unable to Assess  ADLScreening [facility] Assessment Services) Patient's cognitive ability adequate to safely complete daily activities?: Yes Patient able to express need for assistance with ADLs?: Yes Independently performs ADLs?: Yes (appropriate for developmental age)  Prior Inpatient Therapy Prior Inpatient Therapy: Yes Prior Therapy Dates: Pt reported, 25 years ago.  Prior Therapy Facilty/Provider(s): [facility]  Reason for Treatment: MH  Prior Outpatient Therapy Prior Outpatient Therapy: Yes Prior Therapy Dates: Current Prior Therapy Facilty/Provider(s): Psychistrist and counselor Reason for Treatment: Medication management and counseling.  Does patient have an ACCT team?: No Does patient have Intensive In-House Services?  : No Does patient have [facility] services? : No Does patient have P4CC services?: No  ADL Screening (condition at time of admission) Patient's cognitive ability adequate to safely complete daily activities?: Yes Is the patient deaf or have difficulty hearing?: No Does the patient have difficulty seeing, even when wearing glasses/contacts?: Yes (Pt reported, poor vision in her right eye. ) Does the patient have difficulty concentrating, remembering, or making decisions?: Yes Patient able to express need for assistance with ADLs?: Yes Does the patient have difficulty dressing or bathing?: No Independently performs ADLs?: Yes (appropriate for developmental age) Does the patient have difficulty walking or climbing stairs?:  Yes Weakness of  Legs: Both Weakness of Arms/Hands: None       Abuse/Neglect Assessment (Assessment to be complete while patient is alone) Physical Abuse: Denies (Pt denies. ) Verbal Abuse: Denies (Pt denies. ) Sexual Abuse: Denies (Pt denies. ) Exploitation of patient/patient's resources: Denies (Pt denies. ) Self-Neglect: Denies (Pt denies. )     Advance Directives (For Healthcare) Does Patient Have a Medical Advance Directive?: No Would patient like information on creating a medical advance directive?: No - Patient declined    Additional Information 1:1 In Past 12 Months?: No CIRT Risk: No Elopement Risk: No Does patient have medical clearance?: Yes     Disposition: Nira Conn, NP recommends inpatient treatments. Disposition discussed with Dr. Rhunette Croft and Darel Hong, RN. TTS to seek placement.   Disposition Initial Assessment Completed for this Encounter: Yes Disposition of Patient: Inpatient treatment program Type of inpatient treatment program: Adult  Redmond Pulling 03/17/2017 3:22 AM   Redmond Pulling, MS, [facility], [facility] Triage Specialist [phone]

## 2017-03-17 NOTE — ED Notes (Signed)
Bed: [facility] Expected date:  Expected time:  Means of arrival:  Comments: No bed 

## 2017-03-17 NOTE — ED Notes (Signed)
Bed: [facility] Expected date:  Expected time:  Means of arrival:  Comments: 

## 2017-03-17 NOTE — ED Provider Notes (Signed)
[facility]-EMERGENCY DEPT Provider Note   CSN: [id number] Arrival date & time: 03/16/17  2255     History   Chief Complaint Chief Complaint  Patient presents with  . Numbness  . Fatigue  . Suicidal    HPI Meghan Hensley is a 46 y.o. female.  HPI Pt comes in with cc of suicidal ideation and low thyroid. Pt reports that she has generalized weakness that has been present for a long while but it has been getting worse over the last 6 weeks. Pt now lacks motivation to do anything. She has not taken her thyroid meds or psych meds, the latter because she cant afford them and the former because she is weak. Pt denies any confusion, seizures. She does indicate poor appetite and weight gain. Pt is living alone here and has no psychiatrist. Pt has had suicidal ideations of killing herself with her knife.   Past Medical History:  Diagnosis Date  . Bipolar 1 disorder (HCC)   . Schizophrenia (HCC)   . Thyroid disease     Patient Active Problem List   Diagnosis Date Noted  . Akathisia 03/03/2016  . Hypothyroidism 02/21/2016  . Bilateral edema of lower extremity 02/21/2016  . Schizoaffective disorder, bipolar type (HCC) 02/17/2016    History reviewed. No pertinent surgical history.  OB History    No data available       Home Medications    Prior to Admission medications   Medication Sig Start Date End Date Taking? Authorizing Provider  lamoTRIgine (LAMICTAL) 25 MG tablet Take 25 mg by mouth daily. 01/12/17  Yes [provider]  levothyroxine (SYNTHROID, LEVOTHROID) 200 MCG tablet Take 200 mcg by mouth daily. 01/12/17  Yes [provider]  sertraline (ZOLOFT) 100 MG tablet Take 100 mg by mouth every morning. 01/12/17  Yes [provider]  antiseptic oral rinse (BIOTENE) LIQD 15 mLs by Mouth Rinse route as needed for dry mouth. Patient not taking: Reported on 03/17/2017 03/06/16   Oneta Rack, NP  ARIPiprazole (ABILIFY) 5 MG tablet Take 1 tablet (5 mg  total) by mouth 2 (two) times daily in the am and at bedtime.. Patient not taking: Reported on 03/17/2017 03/06/16   Oneta Rack, NP  divalproex (DEPAKOTE ER) 500 MG 24 hr tablet Take 2 tablets (1,000 mg total) by mouth at bedtime. Patient not taking: Reported on 03/17/2017 03/06/16   Oneta Rack, NP  gabapentin (NEURONTIN) 100 MG capsule Take 2 capsules (200 mg total) by mouth 2 (two) times daily. Patient not taking: Reported on 03/17/2017 03/06/16   Oneta Rack, NP  levothyroxine (SYNTHROID, LEVOTHROID) 125 MCG tablet Take 2 tablets (250 mcg total) by mouth daily before breakfast. Patient not taking: Reported on 03/17/2017 03/06/16   Oneta Rack, NP  pantoprazole (PROTONIX) 40 MG tablet Take 1 tablet (40 mg total) by mouth 2 (two) times daily before a meal. Patient not taking: Reported on 03/17/2017 02/24/16   Adonis Brook, NP  traZODone (DESYREL) 50 MG tablet Take 1 tablet (50 mg total) by mouth at bedtime and may repeat dose one time if needed. Patient not taking: Reported on 03/17/2017 03/06/16   Oneta Rack, NP  trihexyphenidyl (ARTANE) 2 MG tablet Take 1 tablet (2 mg total) by mouth at bedtime. Patient not taking: Reported on 03/17/2017 03/06/16   Oneta Rack, NP    Family History Family History  Problem Relation Age of Onset  . Cancer Father   . Mental illness Neg Hx  Social History Social History  Substance Use Topics  . Smoking status: Former Games developer  . Smokeless tobacco: Never Used  . Alcohol use No     Allergies   Patient has no known allergies.   Review of Systems Review of Systems  Constitutional: Positive for activity change.  Respiratory: Negative for shortness of breath.   Cardiovascular: Negative for chest pain.  Neurological: Positive for weakness and numbness.  Psychiatric/Behavioral: Positive for suicidal ideas.  All other systems reviewed and are negative.    Physical Exam Updated Vital Signs BP 123/83 (BP Location: Right Arm)   Pulse  88   Temp 98.3 F (36.8 C) (Oral)   Resp 18   Ht 5' 10.5" (1.791 m)   Wt (!) 145.2 kg (320 lb)   SpO2 100%   BMI 45.27 kg/m   Physical Exam  Constitutional: She is oriented to person, place, and time. She appears well-developed and well-nourished.  HENT:  Head: Normocephalic and atraumatic.  Eyes: Pupils are equal, round, and reactive to light. EOM are normal.  Neck: Neck supple.  Cardiovascular: Normal rate, regular rhythm and normal heart sounds.   No murmur heard. Pulmonary/Chest: Effort normal. No respiratory distress.  Abdominal: Soft. She exhibits no distension. There is no tenderness. There is no rebound and no guarding.  Neurological: She is alert and oriented to person, place, and time.  Skin: Skin is warm and dry.  Psychiatric:  Pressures speech. Pt appears emotionally labile  Nursing note and vitals reviewed.    ED Treatments / Results  Labs (all labs ordered are listed, but only abnormal results are displayed) Labs Reviewed  COMPREHENSIVE METABOLIC PANEL - Abnormal; Notable for the following:       Result Value   Glucose, Bld 100 (*)    Calcium 8.7 (*)    All other components within normal limits  ACETAMINOPHEN LEVEL - Abnormal; Notable for the following:    Acetaminophen (Tylenol), Serum <10 (*)    All other components within normal limits  CBC - Abnormal; Notable for the following:    RDW 15.9 (*)    All other components within normal limits  VALPROIC ACID LEVEL - Abnormal; Notable for the following:    Valproic Acid Lvl <10 (*)    All other components within normal limits  TSH - Abnormal; Notable for the following:    TSH 281.926 (*)    All other components within normal limits  ETHANOL  SALICYLATE LEVEL  RAPID URINE DRUG SCREEN, HOSP PERFORMED  I-STAT BETA HCG BLOOD, ED ([facility], [facility], [facility] ONLY)    EKG  EKG Interpretation None       Radiology No results found.  Procedures Procedures (including critical care time)  Medications Ordered in  ED Medications  risperiDONE (RISPERDAL M-TABS) disintegrating tablet 2 mg (not administered)    And  LORazepam (ATIVAN) tablet 1 mg (not administered)    And  ziprasidone (GEODON) injection 20 mg (not administered)  ibuprofen (ADVIL,MOTRIN) tablet 600 mg (not administered)  zolpidem (AMBIEN) tablet 5 mg (not administered)  pantoprazole (PROTONIX) EC tablet 40 mg (not administered)  ARIPiprazole (ABILIFY) tablet 5 mg (not administered)  divalproex (DEPAKOTE ER) 24 hr tablet 1,000 mg (not administered)  gabapentin (NEURONTIN) capsule 200 mg (200 mg Oral Not Given 03/17/17 0326)  levothyroxine (SYNTHROID, LEVOTHROID) tablet 250 mcg (not administered)  traZODone (DESYREL) tablet 50 mg (not administered)  trihexyphenidyl (ARTANE) tablet 2 mg (not administered)     Initial Impression / Assessment and Plan / ED Course  I have reviewed the triage vital signs and the nursing notes.  Pertinent labs & imaging results that were available during my care of the patient were reviewed by me and considered in my medical decision making (see chart for details).  Clinical Course as of Mar 17 330  Sat Mar 17, 2017  1610 It was expected. Pt is not taking her thyroid medicine, so she is hypothyroid and thus has elevated TSH. Home thyroid meds started. TSH: (!) 281.926 [AN]    Clinical Course User Index [AN] Derwood Kaplan, MD   Pt comes in with multiple complaints, mainly weakness, anorexia, weight gain, lack of energy, worsening depression and SI. I think pt is having hypothyroidism related symptoms and that likely is also impacting her depression. She carries multiple psych dx, and reports that she stopped meds because she couldn't afford them. When she was taking her psych meds, she was functional and better. So I presume that psych decompensation has led to non compliance with her thyroid meds which in turn has led to some of her medical complains.  Tx for her hypothyroidism is simple - starting  back on thyroid meds. She is not in myxedema coma and doesn't need medical admission. We will start her on home meds, medically cleared.  Final Clinical Impressions(s) / ED Diagnoses   Final diagnoses:  Severe episode of recurrent major depressive disorder, without psychotic features (HCC)  Suicidal ideations  Hypothyroidism, unspecified type    New Prescriptions New Prescriptions   No medications on file     Derwood Kaplan, MD 03/17/17 [phone]

## 2017-03-17 NOTE — ED Notes (Signed)
Bed: NHA57 Expected date:  Expected time:  Means of arrival:  Comments: [name]

## 2017-03-18 ENCOUNTER — Encounter (HOSPITAL_COMMUNITY): Payer: Self-pay | Admitting: *Deleted

## 2017-03-18 DIAGNOSIS — Z9114 Patient's other noncompliance with medication regimen: Secondary | ICD-10-CM

## 2017-03-18 DIAGNOSIS — R5383 Other fatigue: Secondary | ICD-10-CM

## 2017-03-18 DIAGNOSIS — R4584 Anhedonia: Secondary | ICD-10-CM

## 2017-03-18 DIAGNOSIS — Z639 Problem related to primary support group, unspecified: Secondary | ICD-10-CM

## 2017-03-18 DIAGNOSIS — R531 Weakness: Secondary | ICD-10-CM

## 2017-03-18 LAB — PREGNANCY, URINE: Preg Test, Ur: NEGATIVE

## 2017-03-18 MED ORDER — ARIPIPRAZOLE 5 MG PO TABS
5.0000 mg | ORAL_TABLET | Freq: Every day | ORAL | Status: DC
  Administered 2017-03-18 – 2017-03-19 (×2): 5 mg via ORAL
  Filled 2017-03-18 (×3): qty 1

## 2017-03-18 MED ORDER — DIVALPROEX SODIUM ER 500 MG PO TB24
500.0000 mg | ORAL_TABLET | Freq: Every day | ORAL | Status: DC
  Filled 2017-03-18 (×2): qty 1

## 2017-03-18 NOTE — Progress Notes (Signed)
Adult Psychoeducational Group Note  Date:  03/18/2017 Time:  10:08 PM  Group Topic/Focus:  Wrap-Up Group:   The focus of this group is to help patients review their daily goal of treatment and discuss progress on daily workbooks.  Participation Level:  Minimal  Participation Quality:  Appropriate  Affect:  Blunted  Cognitive:  Oriented  Insight: Appropriate  Engagement in Group:  Engaged  Modes of Intervention:  Socialization and Support  Additional Comments:  Patient attended and participated in group tonight. She reports that today was her first day so she has been adjusting to the environment. Today she spent a lot of time in bed. She suffers from depression that caused her to sleep during the day.  Lita Mains [facility] 03/18/2017, 10:08 PM

## 2017-03-18 NOTE — [facility] Suicide Risk Assessment (Signed)
[facility] Admission Suicide Risk Assessment   Nursing information obtained from:    chart review, discussion Demographic factors:   divorced Current Mental Status:   depressed, fatigue, anhedonia Loss Factors:   loss of her father, lives by herself Historical Factors:   non adherence to medication,  Risk Reduction Factors:    amenable to MH treatment  Total Time spent with patient: 45 minutes Principal Problem: Schizoaffective disorder, bipolar type (HCC) Diagnosis:   Patient Active Problem List   Diagnosis Date Noted  . Akathisia [G25.71] 03/03/2016  . Hypothyroidism [E03.9] 02/21/2016  . Bilateral edema of lower extremity [R60.0] 02/21/2016  . Schizoaffective disorder, bipolar type (HCC) [F25.0] 02/17/2016   Subjective Data:  Meghan Hensley is a 46 year old female with schizoaffective disorder, hypothyroidism who originally presented to ED with fatigue, weakness in the setting of non adherence to medication, and is admitted to [facility] with concern for SI with plan to hurt herself with a knife.  Patient endorses significant fatigue and neurovegetative symptoms with passive SI. Please see H& P for details.     Continued Clinical Symptoms:  Alcohol Use Disorder Identification Test Final Score (AUDIT): 0 The "Alcohol Use Disorders Identification Test", Guidelines for Use in Primary Care, Second Edition.  World Science writer [facility]). Score between 0-7:  no or low risk or alcohol related problems. Score between 8-15:  moderate risk of alcohol related problems. Score between 16-19:  high risk of alcohol related problems. Score 20 or above:  warrants further diagnostic evaluation for alcohol dependence and treatment.   CLINICAL FACTORS:   Schizophrenia:   Depressive state   Musculoskeletal: Strength & Muscle Tone: within normal limits Gait & Station: normal Patient leans: N/A  Psychiatric Specialty Exam: Physical Exam  Nursing note and vitals reviewed. agree with ED evaluation   Review of Systems  Unable to perform ROS: Mental status change  Psychiatric/Behavioral: Positive for depression and suicidal ideas. Negative for hallucinations and substance abuse. The patient is nervous/anxious and has insomnia.     Blood pressure 113/79, pulse (!) 119, temperature 97.6 F (36.4 C), temperature source Oral, resp. rate 16, height 5\' 10"  (1.778 m), weight (!) 320 lb (145.2 kg), SpO2 97 %.Body mass index is 45.92 kg/m.  General Appearance: Disheveled  Eye Contact:  Poor  Speech:  Normal Rate  Volume:  Decreased  Mood:  tired  Affect:  Blunt  Thought Process:  Coherent, illogical at times  Orientation:  Full (Time, Place, and Person)  Thought Content:  Rumination Perceptions: denies AH/VH  Suicidal Thoughts:  Yes.  without intent/plan  Homicidal Thoughts:  No  Memory:  Immediate;   Fair Recent;   Fair Remote;   Fair  Judgement:  Impaired  Insight:  Shallow  Psychomotor Activity:  Decreased  Concentration:  Concentration: Poor and Attention Span: Poor  Recall:  Poor  Fund of Knowledge:  Fair  Language:  Fair  Akathisia:  No  Handed:  Ambidextrous  AIMS (if indicated):     Assets:  Communication Skills Desire for Improvement  ADL's:  Intact  Cognition:  WNL  Sleep:         COGNITIVE FEATURES THAT CONTRIBUTE TO RISK:  Closed-mindedness    SUICIDE RISK:   Mild:  Suicidal ideation of limited frequency, intensity, duration, and specificity.  There are no identifiable plans, no associated intent, mild dysphoria and related symptoms, good self-control (both objective and subjective assessment), few other risk factors, and identifiable protective factors, including available and accessible social support.  PLAN  OF CARE:  Patient will be admitted to inpatient psychiatric unit for stabilization and safety. Will provide and encourage milieu participation. Provide medication management and maked adjustments as needed.  Will follow daily.   I certify that inpatient  services furnished can reasonably be expected to improve the patient's condition.   Neysa Hotter, MD 03/18/2017, 9:39 AM

## 2017-03-18 NOTE — B[facility] Counselor (Signed)
Adult Comprehensive Assessment  Patient ID: Meghan Hensley, female   DOB: 01/24/1971, 46 y.o.   MRN: [id number]  Information Source: Information source: Patient  Current Stressors:  Educational / Learning stressors: First Investment banker, corporate, had to figure out how to do this on her own.  Majored in business, states this was stressful because people asking questions all the time, expecting advice. Employment / Job issues: Worked in Therapist, music, very stressful and having to answer questions all the time, "nothing private."  Never really felt safe.  Now is on disability. Family Relationships: Is somewhat estranged from mother.  States she does not have relatives in the [place]. Financial / Lack of resources (include bankruptcy): Being on disability is stressful, not enough money. Housing / Lack of housing: Denies stressors. Physical health (include injuries & life threatening diseases): Some stress, feels when she is eating food that people stare at her because of her size.  "As a [place] citizen I know I should be eating.  I feel like men look at me like I'm an obese person who should not be eating." Social relationships: Gets very upset when she sees a man whom she thinks is looking down on her or seeing her as obese. Substance abuse: Denies stressors Bereavement / Loss: Father died 2 years ago, had Alzheimer's, states she was caught off guard and was not prepared.  Living/Environment/Situation:  Living Arrangements: Alone Living conditions (as described by patient or guardian): In transition, "I have a physical address in [place].  I just happen to be estranged from my mother who lives there, does not want me around." How long has patient lived in current situation?: States she does not want to go into it.  Has several places she goes. What is atmosphere in current home: Temporary  Family History:  Marital status: Divorced Divorced, when?: Married 4 years, divorced close to 20  years Does patient have children?: No  Childhood History:  By whom was/is the patient raised?: Both parents Additional childhood history information: States that mother has a little bit of dementia.  Parents were immigrants from [place], each lost a parent in World War II.  A lot of stress in relating to Americans who could not understand. Description of patient's relationship with caregiver when they were a child: States she had to interpret for parents.  Was difficult.  Never knew her grandparents.   Patient's description of current relationship with people who raised him/her: Father is deceased.  Mother does not want to be around her, describes their relationship as somewhat estranged. How were you disciplined when you got in trouble as a child/adolescent?: Does not want to discuss. Does patient have siblings?: No Did patient suffer any verbal/emotional/physical/sexual abuse as a child?: No Did patient suffer from severe childhood neglect?: No Has patient ever been sexually abused/assaulted/raped as an adolescent or adult?: No Was the patient ever a victim of a crime or a disaster?: No Witnessed domestic violence?: No Has patient been effected by domestic violence as an adult?: No (Ex-husband was verbally abusive.)  Education:  Highest grade of school patient has completed: Energy manager Learning disability?: No  Employment/Work Situation:   Employment situation: On disability Why is patient on disability: Schizoaffective disorder How long has patient been on disability: 4 years What is the longest time patient has a held a job?: 5 years 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  I was raised as a [employer] with wine at the table as a child.  In my whole life I've only been to 2 drinking parties." Alcohol/Substance Abuse Treatment Hx: Denies past history Has alcohol/substance abuse ever caused legal problems?: No  Social Support System:   Forensic psychologist System: None Describe Community Support System: N/A Type of faith/religion: Roman Catholic How does patient's faith help to cope with current illness?: "That's my support system.  That's what helps me."  Leisure/Recreation:   Leisure and Hobbies: Lots of interests and hobbies, walking in a mall, shopping for clothes, walking at a botanical garden.  Strengths/Needs:   What things does the patient do well?: "Aggressively multi-talented in sports, music, and art.  I'm considered to be gifted above to the extent my parents almost lost their job.  Talent planning was started in preschool at age 40-3yo for college." In what areas does patient struggle / problems for patient: Self worth, thyroid problems and the need for medication, forgetting to take my medicine, staying on track to do her basic functions and take her medication.  Maybe needs to go back to using a pill box.    Discharge Plan:   Does patient have access to transportation?: No Plan for no access to transportation at discharge: Would like a ride back to the [facility] instead of walking there.  Not sure how she could get to a hotel from there. Will patient be returning to same living situation after discharge?: No Plan for living situation after discharge: A hotel  somewhere Currently receiving community mental health services: No (Has been given directions to [facility], but is not already established.) Does patient have financial barriers related to discharge medications?: No  Summary/Recommendations:   Summary and Recommendations (to be completed by the evaluator): Patient is a 46yo female  with Schizoaffective disorder, Bipolar type admitted with low energy, having trouble remembering to take her thyroid medication, having thoughts of wanting to hurt herself with a knife.  She feels people do not want her to be alive and reports she does not feel valuable, has trouble eating more than 1-2 bites of food, hears "prayerful" voices, thinks she may have cancer and wants to get checked out.  Primary stressors include unresolved grief over father, semi-estrangement from mother, lack of supports, not being able to remember to take her medication, housing that is "in transition."  Patient will benefit from crisis stabilization, medication evaluation, group therapy and psychoeducation, in addition to case management for discharge planning. At discharge it is recommended that Patient adhere to the established discharge plan and continue in treatment.  Lynnell Chad. 03/18/2017

## 2017-03-18 NOTE — Progress Notes (Signed)
Admission Note:  D: Patient is a 46 year old female voluntarily admitted in no acute distress for the treatment of  Depression and SI. On admission, patient presents with sad affect and depressed mood. Calm and cooperative with admission process. Patient stated "I am here because I have a thyroid disease and I stopped takiing my thyroid medicine. I have been so conscious of my self-talkiing about my obesity that I stopped eating". When this writer asked patient why she stopped taking her thyroid medicine, patient stated "my living condition is bad that I threw all the medicine away" patient started crying saying "I don't want to talk about this this anymore". Pt denies SI/HI, AVH at this time. Patient denied any Hx of abuse.  A: Skin/body search done. No contraband found. No wound/bruises noted except for healed blisters??? Beside and under the right breast. POC and unit policies explained and understanding verbalized. Consents obtained. Refused food and fluids offered.  R: Patient had no additional questions or concerns.

## 2017-03-18 NOTE — Progress Notes (Signed)
D: Patient seen sleeping during the shift change. Later in the dayroom watching TV. Presents with flat affect and depressed mood. Complained of indigestion. Accepted Maalox offered -good effect. Patient. denies pain, SI/HI,A H/VH at this time. Endorses mild depression of 3/10 and 0/10 anxiety. Patient  states that if she will be able to put food down her stomach, she will be fine. Patient refused Depakote states "It dries up my eyes and mouth real bad and pulls out my hair a bunch". Patient is open for something else in place of Depakote. Barbara Cower NP, notified who states that the psychiatrist will review that in the morning. Patient notified and she is receptive to that.   A: Staff offered support and encouragement as needed. Due med given as ordered. Routine safety checks maintained. Will continue to monitor patient.   R: Patient remains safe on unit.

## 2017-03-18 NOTE — [facility] Suicide Risk Assessment (Signed)
[facility] INPATIENT:  Family/Significant Other Suicide Prevention Education  Suicide Prevention Education:  Patient Refusal for Family/Significant Other Suicide Prevention Education: The patient Meghan Hensley has refused to provide written consent for family/significant other to be provided Family/Significant Other Suicide Prevention Education during admission and/or prior to discharge.  Physician notified.  Carloyn Jaeger Grossman-Orr 03/18/2017, 4:11 PM

## 2017-03-18 NOTE — Tx Team (Signed)
Initial Treatment Plan 03/18/2017 12:24 AM [name] [id number]    PATIENT STRESSORS: Health problems Medication change or noncompliance   PATIENT STRENGTHS: Capable of independent living Communication skills General fund of knowledge Motivation for treatment/growth   PATIENT IDENTIFIED PROBLEMS: Depression "I can't continue to live like this. I need a change".   Suicide Risk "I don't want to hurt myself".                   DISCHARGE CRITERIA:  Ability to meet basic life and health needs Improved stabilization in mood, thinking, and/or behavior Motivation to continue treatment in a less acute level of care Verbal commitment to aftercare and medication compliance  PRELIMINARY DISCHARGE PLAN: Outpatient therapy Return to previous living arrangement  PATIENT/FAMILY INVOLVEMENT: This treatment plan has been presented to and reviewed with the patient, Meghan Hensley, and/or family member.  The patient and family have been given the opportunity to ask questions and make suggestions.  Meghan Newport, RN 03/18/2017, 12:24 AM

## 2017-03-18 NOTE — H&P (Addendum)
Psychiatric Admission Assessment Adult  Patient Identification: Meghan Hensley MRN:  [id number] Date of Evaluation:  03/18/2017 Chief Complaint:  Bipolar I Disorder hx of Schizophrenia Principal Diagnosis: Schizoaffective disorder, bipolar type ([place]) Diagnosis:   Patient Active Problem List   Diagnosis Date Noted  . Akathisia [G25.71] 03/03/2016  . Hypothyroidism [E03.9] 02/21/2016  . Bilateral edema of lower extremity [R60.0] 02/21/2016  . Schizoaffective disorder, bipolar type ([place]) [F25.0] 02/17/2016   History of Present Illness:  Meghan Hensley is a 46 year old female with schizoaffective disorder, hypothyroidism who originally presented to ED with fatigue, weakness in the setting of non adherence to medication, and is admitted to [facility] with concern for SI with plan to hurt herself with a knife.  She endorses fatigue and asks the interview to be done briefly as possible.  She states that she came here with fatigue. She states that she has not been on any medication for the past six weeks or so. She states that it is not she does not like to take her medication, but she wants "clean environment." She moved out from the apartment and lives in a hotel by herself.  She states that she was "dealing with a couple in the hotel"; when she is asked to elaborate it, she declines it stating that she does not feel like talking more. She is estranged from the family and denies having any children. She states that she feels sad, down, and has vague passive SI. She denies HI, AH/VH. She reports insomnia. UDS negative on admission. No known history of alcohol/drug use.   Associated Signs/Symptoms: Depression Symptoms:  depressed mood, anhedonia, insomnia, fatigue, recurrent thoughts of death, (Hypo) Manic Symptoms:  unable to elaborate it (declines to do interview) Anxiety Symptoms:  mild anxiety Psychotic Symptoms:  denies PTSD Symptoms: unable to elaborate it (declines to do  interview) Total Time spent with patient: 30 minutes  Past Psychiatric History:  She does not have outpatient psychiatrist, and her medication is managed by her PCP [facility] admission in 02/2016, dx with schizoaffective disorder. Per chart, she has had several psych admissions in the past.  Is the patient at risk to self? Yes.    Has the patient been a risk to self in the past 6 months? No.  Has the patient been a risk to self within the distant past? Yes.    Is the patient a risk to others? No.  Has the patient been a risk to others in the past 6 months? No.  Has the patient been a risk to others within the distant past? No.   Prior Inpatient Therapy:   Prior Outpatient Therapy:    Alcohol Screening: 1. How often do you have a drink containing alcohol?: Never 9. Have you or someone else been injured as a result of your drinking?: No 10. Has a relative or friend or a doctor or another health worker been concerned about your drinking or suggested you cut down?: No Alcohol Use Disorder Identification Test Final Score (AUDIT): 0 Brief Intervention: Patient declined brief intervention Substance Abuse History in the last 12 months:  No. Consequences of Substance Abuse: NA Previous Psychotropic Medications: Yes  Psychological Evaluations: No  Past Medical History:  Past Medical History:  Diagnosis Date  . Bipolar 1 disorder ([place])   . Schizophrenia ([place])   . Thyroid disease    History reviewed. No pertinent surgical history. Family History:  Family History  Problem Relation Age of Onset  . Cancer Father   . Mental  illness Neg Hx    Family Psychiatric  History: denies  Tobacco Screening: Have you used any form of tobacco in the last 30 days? (Cigarettes, Smokeless Tobacco, Cigars, and/or Pipes): No Social History:  History  Alcohol Use No     History  Drug Use No    Additional Social History:                           Allergies:  No Known Allergies Lab Results:   Results for orders placed or performed during the hospital encounter of 03/17/17 (from the past 48 hour(s))  Comprehensive metabolic panel     Status: Abnormal   Collection Time: 03/17/17 12:52 AM  Result Value Ref Range   Sodium 138 135 - 145 mmol/L   Potassium 3.9 3.5 - 5.1 mmol/L   Chloride 104 101 - 111 mmol/L   CO2 26 22 - 32 mmol/L   Glucose, Bld 100 (H) 65 - 99 mg/dL   BUN 7 6 - 20 mg/dL   Creatinine, Ser 1.00 0.44 - 1.00 mg/dL   Calcium 8.7 (L) 8.9 - 10.3 mg/dL   Total Protein 7.8 6.5 - 8.1 g/dL   Albumin 3.6 3.5 - 5.0 g/dL   AST 23 15 - 41 U/L   ALT 23 14 - 54 U/L   Alkaline Phosphatase 61 38 - 126 U/L   Total Bilirubin 0.7 0.3 - 1.2 mg/dL   GFR calc non Af Amer >60 >60 mL/min   GFR calc Af Amer >60 >60 mL/min    Comment: (NOTE) The eGFR has been calculated using the CKD EPI equation. This calculation has not been validated in all clinical situations. eGFR's persistently <60 mL/min signify possible Chronic Kidney Disease.    Anion gap 8 5 - 15  Ethanol     Status: None   Collection Time: 03/17/17 12:52 AM  Result Value Ref Range   Alcohol, Ethyl (B) <5 <5 mg/dL    Comment:        LOWEST DETECTABLE LIMIT FOR SERUM ALCOHOL IS 5 mg/dL FOR MEDICAL PURPOSES ONLY   Salicylate level     Status: None   Collection Time: 03/17/17 12:52 AM  Result Value Ref Range   Salicylate Lvl <8.0 2.8 - 30.0 mg/dL  Acetaminophen level     Status: Abnormal   Collection Time: 03/17/17 12:52 AM  Result Value Ref Range   Acetaminophen (Tylenol), Serum <10 (L) 10 - 30 ug/mL    Comment:        THERAPEUTIC CONCENTRATIONS VARY SIGNIFICANTLY. A RANGE OF 10-30 ug/mL MAY BE AN EFFECTIVE CONCENTRATION FOR MANY PATIENTS. HOWEVER, SOME ARE BEST TREATED AT CONCENTRATIONS OUTSIDE THIS RANGE. ACETAMINOPHEN CONCENTRATIONS >150 ug/mL AT 4 HOURS AFTER INGESTION AND >50 ug/mL AT 12 HOURS AFTER INGESTION ARE OFTEN ASSOCIATED WITH TOXIC REACTIONS.   cbc     Status: Abnormal   Collection Time:  03/17/17 12:52 AM  Result Value Ref Range   WBC 10.5 4.0 - 10.5 K/uL   RBC 4.41 3.87 - 5.11 MIL/uL   Hemoglobin 12.0 12.0 - 15.0 g/dL   HCT 36.9 36.0 - 46.0 %   MCV 83.7 78.0 - 100.0 fL   MCH 27.2 26.0 - 34.0 pg   MCHC 32.5 30.0 - 36.0 g/dL   RDW 15.9 (H) 11.5 - 15.5 %   Platelets 335 150 - 400 K/uL  Valproic acid level     Status: Abnormal   Collection Time: 03/17/17 12:53 AM  Result Value Ref Range   Valproic Acid Lvl <10 (L) 50.0 - 100.0 ug/mL  TSH     Status: Abnormal   Collection Time: 03/17/17 12:54 AM  Result Value Ref Range   TSH 281.926 (H) 0.350 - 4.500 uIU/mL    Comment: Performed by a 3rd Generation assay with a functional sensitivity of <=0.01 uIU/mL.  I-Stat beta hCG blood, ED     Status: None   Collection Time: 03/17/17  1:04 AM  Result Value Ref Range   I-stat hCG, quantitative <5.0 <5 mIU/mL   Comment 3            Comment:   GEST. AGE      CONC.  (mIU/mL)   <=1 WEEK        5 - 50     2 WEEKS       50 - 500     3 WEEKS       100 - 10,000     4 WEEKS     1,000 - 30,000        FEMALE AND NON-PREGNANT FEMALE:     LESS THAN 5 mIU/mL   Rapid urine drug screen (hospital performed)     Status: None   Collection Time: 03/17/17  1:58 AM  Result Value Ref Range   Opiates NONE DETECTED NONE DETECTED   Cocaine NONE DETECTED NONE DETECTED   Benzodiazepines NONE DETECTED NONE DETECTED   Amphetamines NONE DETECTED NONE DETECTED   Tetrahydrocannabinol NONE DETECTED NONE DETECTED   Barbiturates NONE DETECTED NONE DETECTED    Comment:        DRUG SCREEN FOR MEDICAL PURPOSES ONLY.  IF CONFIRMATION IS NEEDED FOR ANY PURPOSE, NOTIFY LAB WITHIN 5 DAYS.        LOWEST DETECTABLE LIMITS FOR URINE DRUG SCREEN Drug Class       Cutoff (ng/mL) Amphetamine      1000 Barbiturate      200 Benzodiazepine   161 Tricyclics       096 Opiates          300 Cocaine          300 THC              50     Blood Alcohol level:  Lab Results  Component Value Date   ETH <5 03/17/2017    ETH <5 04/54/0981    Metabolic Disorder Labs:  Lab Results  Component Value Date   HGBA1C 5.1 03/01/2016   MPG 100 03/01/2016   No results found for: PROLACTIN Lab Results  Component Value Date   CHOL 160 03/01/2016   TRIG 233 (H) 03/01/2016   HDL 27 (L) 03/01/2016   CHOLHDL 5.9 03/01/2016   VLDL 47 (H) 03/01/2016   LDLCALC 86 03/01/2016    Current Medications: Current Facility-Administered Medications  Medication Dose Route Frequency Provider Last Rate Last Dose  . acetaminophen (TYLENOL) tablet 650 mg  650 mg Oral Q6H PRN Ethelene Hal, NP      . alum & mag hydroxide-simeth (MAALOX/MYLANTA) 200-200-20 MG/5ML suspension 30 mL  30 mL Oral Q4H PRN Ethelene Hal, NP      . ARIPiprazole (ABILIFY) tablet 5 mg  5 mg Oral Daily Layli Capshaw, MD      . divalproex (DEPAKOTE ER) 24 hr tablet 500 mg  500 mg Oral QHS Iren Whipp, MD      . hydrOXYzine (ATARAX/VISTARIL) tablet 25 mg  25 mg Oral Q6H PRN Ethelene Hal,  NP      . ibuprofen (ADVIL,MOTRIN) tablet 600 mg  600 mg Oral Q8H PRN Ethelene Hal, NP      . levothyroxine (SYNTHROID, LEVOTHROID) tablet 250 mcg  250 mcg Oral QAC breakfast Ethelene Hal, NP   250 mcg at 03/18/17 0640  . risperiDONE (RISPERDAL M-TABS) disintegrating tablet 2 mg  2 mg Oral Q8H PRN Ethelene Hal, NP       And  . LORazepam (ATIVAN) tablet 1 mg  1 mg Oral PRN Ethelene Hal, NP       And  . ziprasidone (GEODON) injection 20 mg  20 mg Intramuscular PRN Ethelene Hal, NP      . magnesium hydroxide (MILK OF MAGNESIA) suspension 30 mL  30 mL Oral Daily PRN Ethelene Hal, NP      . pantoprazole (PROTONIX) EC tablet 40 mg  40 mg Oral BID AC Ethelene Hal, NP   40 mg at 03/18/17 0640  . traZODone (DESYREL) tablet 50 mg  50 mg Oral QHS PRN Ethelene Hal, NP      . traZODone (DESYREL) tablet 50 mg  50 mg Oral QHS,MR X 1 Ethelene Hal, NP      . trihexyphenidyl (ARTANE)  tablet 2 mg  2 mg Oral QHS Ethelene Hal, NP      . zolpidem [facility]) tablet 5 mg  5 mg Oral QHS PRN Ethelene Hal, NP       PTA Medications: Prescriptions Prior to Admission  Medication Sig Dispense Refill Last Dose  . antiseptic oral rinse (BIOTENE) LIQD 15 mLs by Mouth Rinse route as needed for dry mouth. (Patient not taking: Reported on 03/17/2017) 1 Bottle `0 Not Taking at Unknown time  . ARIPiprazole (ABILIFY) 5 MG tablet Take 1 tablet (5 mg total) by mouth 2 (two) times daily in the am and at bedtime.. (Patient not taking: Reported on 03/17/2017) 60 tablet 0 Not Taking at Unknown time  . divalproex (DEPAKOTE ER) 500 MG 24 hr tablet Take 2 tablets (1,000 mg total) by mouth at bedtime. (Patient not taking: Reported on 03/17/2017) 60 tablet 0 Not Taking at Unknown time  . gabapentin (NEURONTIN) 100 MG capsule Take 2 capsules (200 mg total) by mouth 2 (two) times daily. (Patient not taking: Reported on 03/17/2017) 60 capsule 0 Not Taking at Unknown time  . lamoTRIgine (LAMICTAL) 25 MG tablet Take 25 mg by mouth daily.  0 Past Month at Unknown time  . levothyroxine (SYNTHROID, LEVOTHROID) 125 MCG tablet Take 2 tablets (250 mcg total) by mouth daily before breakfast. (Patient not taking: Reported on 03/17/2017) 30 tablet 0 Not Taking at Unknown time  . levothyroxine (SYNTHROID, LEVOTHROID) 200 MCG tablet Take 200 mcg by mouth daily.  0 Past Month at Unknown time  . pantoprazole (PROTONIX) 40 MG tablet Take 1 tablet (40 mg total) by mouth 2 (two) times daily before a meal. (Patient not taking: Reported on 03/17/2017) 60 tablet 0 Not Taking at Unknown time  . sertraline (ZOLOFT) 100 MG tablet Take 100 mg by mouth every morning.  0 Past Month at Unknown time  . traZODone (DESYREL) 50 MG tablet Take 1 tablet (50 mg total) by mouth at bedtime and may repeat dose one time if needed. (Patient not taking: Reported on 03/17/2017) 30 tablet 0 Not Taking at Unknown time  . trihexyphenidyl (ARTANE) 2  MG tablet Take 1 tablet (2 mg total) by mouth at bedtime. (Patient not taking: Reported on  03/17/2017) 30 tablet 0 Not Taking at Unknown time    Musculoskeletal: Strength & Muscle Tone: within normal limits Gait & Station: normal Patient leans: N/A  Psychiatric Specialty Exam: Physical Exam  Nursing note and vitals reviewed. agree with ED evaluation  Review of Systems  Unable to perform ROS: Mental status change    Blood pressure 113/79, pulse (!) 119, temperature 97.6 F (36.4 C), temperature source Oral, resp. rate 16, height '5\' 10"'$  (1.778 m), weight (!) 320 lb (145.2 kg), SpO2 97 %.Body mass index is 45.92 kg/m.  General Appearance: Disheveled  Eye Contact:  Poor  Speech:  Normal Rate  Volume:  Decreased  Mood:  tired  Affect:  Blunt/somnolent  Thought Process:  Coherent, illogical at times  Orientation:  Full (Time, Place, and Person)  Thought Content:  Rumination Perceptions: denies AH/VH  Suicidal Thoughts:  Yes.  without intent/plan  Homicidal Thoughts:  No  Memory:  Immediate;   Fair Recent;   Fair Remote;   Fair  Judgement:  Impaired  Insight:  Shallow  Psychomotor Activity:  Decreased  Concentration:  Concentration: Poor and Attention Span: Poor  Recall:  Poor  Fund of Knowledge:  Fair  Language:  Fair  Akathisia:  No  Handed:  Right  AIMS (if indicated):   no tremors  Assets:  Desire for Improvement  ADL's:  Intact  Cognition:  WNL  Sleep:      Assessment Meghan Hensley is a 46 year old female with schizoaffective disorder, hypothyroidism who originally presented to ED with fatigue, weakness in the setting of non adherence to medication, and is admitted to [facility] with concern for SI with plan to hurt herself with a knife.  # Schizoaffective disorder Exam is notable for her blunt affect, perseveration about medication, room and patient was minimally participated in the interview (although she was cooperative) due to prominent fatigue. Will restart her  medication from lower dose given her somnolence; Depakote and Abilify as a mood stabilizer. Noted that qtc was prolonged on recent EKG; however, will continue lower dose of abilify at this time given its benefit, and this medication to be less causative agent for qtc prolongation. Will hold gabapentin to avoid any side effect of oversedation at this time.   Plan - Start Depakote 500 mg qhs; consider uptitration in the near future - Start Abilify 5 mg daily (used to be on 5 mg BID) - Continue trazodone 50 mg qhs for insomnia - Continue artane for EPS  - Continue synthroid 250 mcg for hypothyroidism - Hold gabapentin - Recheck EKG; QTc 552 msec on 8/15 - get urine hcg -  - Medication management to reduce current symptoms to base line and improve the patient's overall level of functioning. - Monitor for the adverse effect of the medications and anger outbursts - Continue 15 minutes observation for safety concerns - Encouraged to participate in milieu therapy and group therapy counseling sessions and also work with coping skills -  Develop treatment plan to decrease risk of relapse upon discharge and to reduce the need for readmission. -  Psycho-social education regarding relapse prevention and self care.  Treatment Plan Summary: Daily contact with patient to assess and evaluate symptoms and progress in treatment  Observation Level/Precautions:  15 minute checks  Laboratory:  HCG, EKG  Psychotherapy:  Individual, group  Medications:  As above  Consultations:  As needed for hypothyroidism  Discharge Concerns:  Establish outpatient follow up, homeless  Estimated LOS: 5-7 days  Other:  n/a  Physician Treatment Plan for Primary Diagnosis: Schizoaffective disorder, bipolar type ([place]) Long Term Goal(s): Improvement in symptoms so as ready for discharge  Short Term Goals: Ability to verbalize feelings will improve, Ability to disclose and discuss suicidal ideas and Ability to identify and  develop effective coping behaviors will improve  Physician Treatment Plan for Secondary Diagnosis: Principal Problem:   Schizoaffective disorder, bipolar type ([place])  Long Term Goal(s): Improvement in symptoms so as ready for discharge  Short Term Goals: Ability to verbalize feelings will improve, Ability to disclose and discuss suicidal ideas, Ability to demonstrate self-control will improve and Compliance with prescribed medications will improve  I certify that inpatient services furnished can reasonably be expected to improve the patient's condition.    Norman Clay, MD 8/19/20189:37 AM

## 2017-03-18 NOTE — Progress Notes (Signed)
Patient ID: Meghan Hensley, female   DOB: 1971-06-10, 46 y.o.   MRN: [id number]    D: Pt has been very flat and depressed on the unit today. Pt slept most of the day, she did not attend any groups nor did she engage in any treatment. Pt reported that she was exhausted and that she just needed to rest. Pt reported that she was not depressed, not hopeless, and was not having any anxiety. Pt reported being negative SI/HI, no AH/VH noted. A: 15 min checks continued for patient safety. R: Pt safety maintained.

## 2017-03-18 NOTE — [facility] Group Notes (Signed)
[facility] LCSW Group Therapy Note  Date/Time:  03/18/2017  11:00AM-12:00PM  Type of Therapy and Topic:  Group Therapy:  Music and Mood  Participation Level:  Did Not Attend   Description of Group: In this process group, members listened to a variety of genres of music and identified that different types of music evoke different responses.  Patients were encouraged to identify music that was soothing for them and music that was energizing for them.  Patients discussed how this knowledge can help with wellness and recovery in various ways including managing depression and anxiety as well as encouraging healthy sleep habits.    Therapeutic Goals: 1. Patients will explore the impact of different varieties of music on mood 2. Patients will verbalize the thoughts they have when listening to different types of music 3. Patients will identify music that is soothing to them as well as music that is energizing to them 4. Patients will discuss how to use this knowledge to assist in maintaining wellness and recovery 5. Patients will explore the use of music as a coping skill  Summary of Patient Progress:  N/A  Therapeutic Modalities: Solution Focused Brief Therapy Motivational Interviewing Activity   Garnet Chatmon Grossman-Orr, LCSW 03/18/2017 12:25 PM    

## 2017-03-19 DIAGNOSIS — F39 Unspecified mood [affective] disorder: Secondary | ICD-10-CM

## 2017-03-19 DIAGNOSIS — R600 Localized edema: Secondary | ICD-10-CM

## 2017-03-19 DIAGNOSIS — R45851 Suicidal ideations: Secondary | ICD-10-CM

## 2017-03-19 DIAGNOSIS — Z87891 Personal history of nicotine dependence: Secondary | ICD-10-CM

## 2017-03-19 DIAGNOSIS — E039 Hypothyroidism, unspecified: Secondary | ICD-10-CM

## 2017-03-19 DIAGNOSIS — F250 Schizoaffective disorder, bipolar type: Principal | ICD-10-CM

## 2017-03-19 DIAGNOSIS — G2571 Drug induced akathisia: Secondary | ICD-10-CM

## 2017-03-19 DIAGNOSIS — F419 Anxiety disorder, unspecified: Secondary | ICD-10-CM

## 2017-03-19 DIAGNOSIS — G4700 Insomnia, unspecified: Secondary | ICD-10-CM

## 2017-03-19 MED ORDER — LAMOTRIGINE 25 MG PO TABS
25.0000 mg | ORAL_TABLET | Freq: Every day | ORAL | Status: DC
  Administered 2017-03-19 – 2017-03-23 (×5): 25 mg via ORAL
  Filled 2017-03-19 (×7): qty 1

## 2017-03-19 MED ORDER — LORAZEPAM 2 MG/ML IJ SOLN: 1.0000 mg | Freq: Four times a day (QID) | INTRAMUSCULAR | Status: DC | PRN

## 2017-03-19 MED ORDER — ZOLPIDEM TARTRATE 5 MG PO TABS
5.0000 mg | ORAL_TABLET | Freq: Every day | ORAL | Status: DC
  Filled 2017-03-19: qty 1

## 2017-03-19 MED ORDER — TRAZODONE HCL 50 MG PO TABS
50.0000 mg | ORAL_TABLET | Freq: Every evening | ORAL | Status: DC | PRN
  Administered 2017-03-19: 50 mg via ORAL
  Filled 2017-03-19: qty 1

## 2017-03-19 MED ORDER — ARIPIPRAZOLE 5 MG PO TABS
5.0000 mg | ORAL_TABLET | Freq: Every day | ORAL | Status: DC
  Administered 2017-03-20 – 2017-03-21 (×2): 5 mg via ORAL
  Filled 2017-03-19 (×4): qty 1

## 2017-03-19 MED ORDER — LORAZEPAM 1 MG PO TABS
1.0000 mg | ORAL_TABLET | Freq: Four times a day (QID) | ORAL | Status: DC | PRN
  Administered 2017-03-23: 1 mg via ORAL
  Filled 2017-03-19: qty 1

## 2017-03-19 MED ORDER — TRAZODONE HCL 100 MG PO TABS
100.0000 mg | ORAL_TABLET | Freq: Every day | ORAL | Status: DC
  Filled 2017-03-19: qty 1

## 2017-03-19 MED ORDER — ARIPIPRAZOLE 5 MG PO TABS
5.0000 mg | ORAL_TABLET | ORAL | Status: DC
  Filled 2017-03-19 (×2): qty 1

## 2017-03-19 NOTE — [facility] Group Notes (Signed)
[facility] LCSW Group Therapy  03/19/2017 , 3:28 PM   Type of Therapy:  Group Therapy  Participation Level:  Active  Participation Quality:  Attentive  Affect:  Appropriate  Cognitive:  Alert  Insight:  Improving  Engagement in Therapy:  Engaged  Modes of Intervention:  Discussion, Exploration and Socialization  Summary of Progress/Problems: Today's group focused on the term Diagnosis.  Participants were asked to define the term, and then pronounce whether it is a negative, positive or neutral term. Invited.  Stayed the entire time, engaged throughout.  Identified nothing that she does to stay occupied.  "When I was young I had a gym membership and was active.  Now I just watch TV."  Cited her father's death 2 years ago as the reason for her struggles, and droned on about her misunderstanding or misinformation that caused her to think he was fine when he was really dying of cancer over a 12 month period.  [name] 03/19/2017 , 3:28 PM

## 2017-03-19 NOTE — Progress Notes (Signed)
Did not attend. 

## 2017-03-19 NOTE — Progress Notes (Signed)
DAR NOTE: Patient presents with anxious affect and mood.  Denies auditory and visual hallucinations.  Reports suicidal thoughts but verbally contracts for safety.  Reports symptoms of tremors, chilling, nausea, irritability, diarrhea on self inventory form.  Described energy level as low and concentration as poor.  Rates depression at 7.5-9, hopelessness at 7.5-9, and anxiety at 7.5-9.  Maintained on routine safety checks.  Medications given as prescribed.  Support and encouragement offered as needed.  Attended group and participated.  States goal for today is "feeling less hopeless."  Patient stayed in her room most of this shift.  Patient encouraged to be visible in the dayroom.

## 2017-03-19 NOTE — Progress Notes (Signed)
Recreation Therapy Notes  Date: 03/19/17 Time: 1000 Location: [address] Dayroom  Group Topic: Coping Skills  Goal Area(s) Addresses:  Patient will be able to identify the benefits of positive coping skills. Patient will be able to identify the of using coping skills post d/c.  Behavioral Response: Engaged  Intervention: Print production planner, pencils  Activity: Mindmap.  Patients were given a worksheet of a blank mindmap.  Patients and LRT filled in the first eight boxes together with situations/circumstances that would cause you use coping skills.  Patients identified the eight areas as relationships, depression, anxiety, family, career, arguments, peer pressure and force.   Education: Pharmacologist, Building control surveyor.   Education Outcome: Acknowledges understanding/In group clarification offered/Needs additional education.   Clinical Observations/Feedback: Pt was engaged and appropriate.  Pt stated some of her coping skills were talk to family doctor or confidant for arguments; go to another school for peer pressure; vacation for relationships, go to a party for depression and sing/pray for family.  Pt explained that using coping skills "allows you to see an amount of success and be hopeful".   Caroll Rancher, LRT/CTRS         Caroll Rancher A 03/19/2017 11:42 AM

## 2017-03-19 NOTE — Progress Notes (Signed)
[facility] MD Progress Note  03/19/2017 11:42 AM Meghan Hensley  MRN:  [id number] Subjective:  Patient states " I still have these mood swings , I feel so sluggish . I have lots of other issues like my hair is falling a lot."  Objective:Patient seen and chart reviewed.Discussed patient with treatment team. Pt today seen as guarded , paranoid , reports mood lability, mostly feeling sluggish . Pt also reports SI . Pt states she does not want to be on the depakote and per RN has been refusing to take it. Pt agrees with Lamictal, will start trial today. Pt reports noncompliance with all her medications including her thyroid medications which could have led to her most recent hospitalization. Continue to treat.      Principal Problem: Schizoaffective disorder, bipolar type (HCC) Diagnosis:   Patient Active Problem List   Diagnosis Date Noted  . Akathisia [G25.71] 03/03/2016  . Hypothyroidism [E03.9] 02/21/2016  . Bilateral edema of lower extremity [R60.0] 02/21/2016  . Schizoaffective disorder, bipolar type (HCC) [F25.0] 02/17/2016   Total Time spent with patient: 25 minutes  Past Psychiatric History: Please see H&P.   Past Medical History:  Past Medical History:  Diagnosis Date  . Bipolar 1 disorder (HCC)   . Schizophrenia (HCC)   . Thyroid disease    History reviewed. No pertinent surgical history. Family History:  Family History  Problem Relation Age of Onset  . Cancer Father   . Mental illness Neg Hx    Family Psychiatric  History: Please see H&P.  Social History: Please see H&P.  History  Alcohol Use No     History  Drug Use No    Social History   Social History  . Marital status: Divorced    Spouse name: N/A  . Number of children: N/A  . Years of education: N/A   Social History Main Topics  . Smoking status: Former Games developer  . Smokeless tobacco: Never Used  . Alcohol use No  . Drug use: No  . Sexual activity: Not Currently   Other Topics Concern  . None    Social History Narrative  . None   Additional Social History:                         Sleep: Fair  Appetite:  Fair  Current Medications: Current Facility-Administered Medications  Medication Dose Route Frequency Provider Last Rate Last Dose  . acetaminophen (TYLENOL) tablet 650 mg  650 mg Oral Q6H PRN Laveda Abbe, NP      . alum & mag hydroxide-simeth (MAALOX/MYLANTA) 200-200-20 MG/5ML suspension 30 mL  30 mL Oral Q4H PRN Laveda Abbe, NP   30 mL at 03/18/17 2001  . [START ON 03/20/2017] ARIPiprazole (ABILIFY) tablet 5 mg  5 mg Oral Daily Jamielynn Wigley, MD      . hydrOXYzine (ATARAX/VISTARIL) tablet 25 mg  25 mg Oral Q6H PRN Laveda Abbe, NP   25 mg at 03/19/17 0254  . ibuprofen (ADVIL,MOTRIN) tablet 600 mg  600 mg Oral Q8H PRN Laveda Abbe, NP      . lamoTRIgine (LAMICTAL) tablet 25 mg  25 mg Oral Daily Asuncion Shibata, MD      . levothyroxine (SYNTHROID, LEVOTHROID) tablet 250 mcg  250 mcg Oral QAC breakfast Laveda Abbe, NP   250 mcg at 03/19/17 [phone]  . LORazepam (ATIVAN) tablet 1 mg  1 mg Oral Q6H PRN Jomarie Longs, MD  Or  . LORazepam (ATIVAN) injection 1 mg  1 mg Intramuscular Q6H PRN Amery Minasyan, MD      . magnesium hydroxide (MILK OF MAGNESIA) suspension 30 mL  30 mL Oral Daily PRN Laveda Abbe, NP      . pantoprazole (PROTONIX) EC tablet 40 mg  40 mg Oral BID AC Laveda Abbe, NP   40 mg at 03/19/17 0981  . traZODone (DESYREL) tablet 50 mg  50 mg Oral QHS PRN Carmeron Heady, MD      . trihexyphenidyl (ARTANE) tablet 2 mg  2 mg Oral QHS Laveda Abbe, NP   2 mg at 03/18/17 2105  . zolpidem (AMBIEN) tablet 5 mg  5 mg Oral QHS Gen Clagg, Levin Bacon, MD        Lab Results:  Results for orders placed or performed during the hospital encounter of 03/17/17 (from the past 48 hour(s))  Pregnancy, urine     Status: None   Collection Time: 03/18/17  9:15 AM  Result Value Ref Range   Preg Test,  Ur NEGATIVE NEGATIVE    Comment:        THE SENSITIVITY OF THIS METHODOLOGY IS >20 mIU/mL. Performed at [facility], [address] [place], [place] [address]     Blood Alcohol level:  Lab Results  Component Value Date   [facility] <5 03/17/2017   ETH <5 03/13/2016    Metabolic Disorder Labs: Lab Results  Component Value Date   HGBA1C 5.1 03/01/2016   MPG 100 03/01/2016   No results found for: PROLACTIN Lab Results  Component Value Date   CHOL 160 03/01/2016   TRIG 233 (H) 03/01/2016   HDL 27 (L) 03/01/2016   CHOLHDL 5.9 03/01/2016   VLDL 47 (H) 03/01/2016   LDLCALC 86 03/01/2016    Physical Findings: AIMS:  , ,  ,  ,    CIWA:    COWS:     Musculoskeletal: Strength & Muscle Tone: within normal limits Gait & Station: normal Patient leans: N/A  Psychiatric Specialty Exam: Physical Exam  Nursing note and vitals reviewed.   Review of Systems  Psychiatric/Behavioral: Positive for depression. The patient is nervous/anxious.   All other systems reviewed and are negative.   Blood pressure (!) 132/95, pulse (!) 124, temperature 99 F (37.2 C), temperature source Oral, resp. rate 20, height 5\' 10"  (1.778 m), weight (!) 145.2 kg (320 lb), SpO2 97 %.Body mass index is 45.92 kg/m.  General Appearance: Disheveled and Guarded  Eye Contact:  Fair  Speech:  Normal Rate  Volume:  Decreased  Mood:  Anxious and Depressed  Affect:  Labile  Thought Process:  Irrelevant and Descriptions of Associations: Circumstantial  Orientation:  Full (Time, Place, and Person)  Thought Content:  Paranoid Ideation  Suicidal Thoughts:  Yes.  without intent/plan  Homicidal Thoughts:  No  Memory:  Immediate;   Fair Recent;   Fair Remote;   Fair  Judgement:  Impaired  Insight:  Fair  Psychomotor Activity:  Decreased  Concentration:  Concentration: Fair and Attention Span: Fair  Recall:  [employer] of Knowledge:  Fair  Language:  Fair  Akathisia:  No  Handed:  Right   AIMS (if indicated):     Assets:  Desire for Improvement  ADL's:  Intact  Cognition:  WNL  Sleep:  Number of Hours: 4.5     Treatment Plan Summary:Marlee is well known to [facility] unit , hx of schizoaffective do as well as hypothyroidism ,  presented with fatigue , weakness to ED originally , currently reports mood swings , appears to be paranoid and has SI . Restarted her medications , will continue to titrate high. She is noted as tachycardic , will monitor and treat. Pt also with QT prolongation on admission , most recent 03/18/2017 - 466  Daily contact with patient to assess and evaluate symptoms and progress in treatment, Medication management and Plan see below  Will discontinue Depakote due to pt refusing it and hx of ADRs. Start Lamictal 25 mg po daily for mood sx. Continue Abilify 5 mg po daily for affective sx, psychosis. But will be cautious since has hx of QT prolongation. Continue Ambien 5 mg po qhs for insomnia Make Trazodone 50 mg po qhs prn for insomnia. Continue Artane 2 mg po qhs for EPS. Restarted Synthroid 250 mcg daily for thyroid problem.  Will get lipid panel, hba1c, pl. CSW will continue to work on disposition.    Ameena Vesey, MD 03/19/2017, 11:42 AM

## 2017-03-19 NOTE — Tx Team (Signed)
Interdisciplinary Treatment and Diagnostic Plan Update  03/19/2017 Time of Session: 1:09 PM  Yu Cragun MRN: [id number]  Principal Diagnosis: Schizoaffective disorder, bipolar type [facility])  Secondary Diagnoses: Principal Problem:   Schizoaffective disorder, bipolar type ([place])   Current Medications:  Current Facility-Administered Medications  Medication Dose Route Frequency Provider Last Rate Last Dose  . acetaminophen (TYLENOL) tablet 650 mg  650 mg Oral Q6H PRN Ethelene Hal, NP   650 mg at 03/19/17 1156  . alum & mag hydroxide-simeth (MAALOX/MYLANTA) 200-200-20 MG/5ML suspension 30 mL  30 mL Oral Q4H PRN Ethelene Hal, NP   30 mL at 03/18/17 2001  . [START ON 03/20/2017] ARIPiprazole (ABILIFY) tablet 5 mg  5 mg Oral Daily Eappen, Saramma, MD      . hydrOXYzine (ATARAX/VISTARIL) tablet 25 mg  25 mg Oral Q6H PRN Ethelene Hal, NP   25 mg at 03/19/17 0254  . ibuprofen (ADVIL,MOTRIN) tablet 600 mg  600 mg Oral Q8H PRN Ethelene Hal, NP      . lamoTRIgine (LAMICTAL) tablet 25 mg  25 mg Oral Daily Eappen, Ria Clock, MD   25 mg at 03/19/17 1155  . levothyroxine (SYNTHROID, LEVOTHROID) tablet 250 mcg  250 mcg Oral QAC breakfast Ethelene Hal, NP   250 mcg at 03/19/17 0998  . LORazepam (ATIVAN) tablet 1 mg  1 mg Oral Q6H PRN Ursula Alert, MD       Or  . LORazepam (ATIVAN) injection 1 mg  1 mg Intramuscular Q6H PRN Eappen, Saramma, MD      . magnesium hydroxide (MILK OF MAGNESIA) suspension 30 mL  30 mL Oral Daily PRN Ethelene Hal, NP      . pantoprazole (PROTONIX) EC tablet 40 mg  40 mg Oral BID AC Ethelene Hal, NP   40 mg at 03/19/17 3382  . traZODone (DESYREL) tablet 50 mg  50 mg Oral QHS PRN Eappen, Saramma, MD      . trihexyphenidyl (ARTANE) tablet 2 mg  2 mg Oral QHS Ethelene Hal, NP   2 mg at 03/18/17 2105  . zolpidem (AMBIEN) tablet 5 mg  5 mg Oral QHS Ursula Alert, MD        PTA Medications: Prescriptions  Prior to Admission  Medication Sig Dispense Refill Last Dose  . antiseptic oral rinse (BIOTENE) LIQD 15 mLs by Mouth Rinse route as needed for dry mouth. (Patient not taking: Reported on 03/17/2017) 1 Bottle `0 Not Taking at Unknown time  . ARIPiprazole (ABILIFY) 5 MG tablet Take 1 tablet (5 mg total) by mouth 2 (two) times daily in the am and at bedtime.. (Patient not taking: Reported on 03/17/2017) 60 tablet 0 Not Taking at Unknown time  . divalproex (DEPAKOTE ER) 500 MG 24 hr tablet Take 2 tablets (1,000 mg total) by mouth at bedtime. (Patient not taking: Reported on 03/17/2017) 60 tablet 0 Not Taking at Unknown time  . gabapentin (NEURONTIN) 100 MG capsule Take 2 capsules (200 mg total) by mouth 2 (two) times daily. (Patient not taking: Reported on 03/17/2017) 60 capsule 0 Not Taking at Unknown time  . lamoTRIgine (LAMICTAL) 25 MG tablet Take 25 mg by mouth daily.  0 Past Month at Unknown time  . levothyroxine (SYNTHROID, LEVOTHROID) 125 MCG tablet Take 2 tablets (250 mcg total) by mouth daily before breakfast. (Patient not taking: Reported on 03/17/2017) 30 tablet 0 Not Taking at Unknown time  . levothyroxine (SYNTHROID, LEVOTHROID) 200 MCG tablet Take 200 mcg by mouth daily.  0 Past Month at Unknown time  . pantoprazole (PROTONIX) 40 MG tablet Take 1 tablet (40 mg total) by mouth 2 (two) times daily before a meal. (Patient not taking: Reported on 03/17/2017) 60 tablet 0 Not Taking at Unknown time  . sertraline (ZOLOFT) 100 MG tablet Take 100 mg by mouth every morning.  0 Past Month at Unknown time  . traZODone (DESYREL) 50 MG tablet Take 1 tablet (50 mg total) by mouth at bedtime and may repeat dose one time if needed. (Patient not taking: Reported on 03/17/2017) 30 tablet 0 Not Taking at Unknown time  . trihexyphenidyl (ARTANE) 2 MG tablet Take 1 tablet (2 mg total) by mouth at bedtime. (Patient not taking: Reported on 03/17/2017) 30 tablet 0 Not Taking at Unknown time    Patient Stressors: Health  problems Medication change or noncompliance  Patient Strengths: Capable of independent living Curator fund of knowledge Motivation for treatment/growth  Treatment Modalities: Medication Management, Group therapy, Case management,  1 to 1 session with clinician, Psychoeducation, Recreational therapy.   Physician Treatment Plan for Primary Diagnosis: Schizoaffective disorder, bipolar type ([place]) Long Term Goal(s): Improvement in symptoms so as ready for discharge  Short Term Goals: Ability to verbalize feelings will improve Ability to disclose and discuss suicidal ideas Ability to identify and develop effective coping behaviors will improve Ability to verbalize feelings will improve Ability to disclose and discuss suicidal ideas Ability to demonstrate self-control will improve Compliance with prescribed medications will improve  Medication Management: Evaluate patient's response, side effects, and tolerance of medication regimen.  Therapeutic Interventions: 1 to 1 sessions, Unit Group sessions and Medication administration.  Evaluation of Outcomes: Progressing  Physician Treatment Plan for Secondary Diagnosis: Principal Problem:   Schizoaffective disorder, bipolar type ([place])   Long Term Goal(s): Improvement in symptoms so as ready for discharge  Short Term Goals: Ability to verbalize feelings will improve Ability to disclose and discuss suicidal ideas Ability to identify and develop effective coping behaviors will improve Ability to verbalize feelings will improve Ability to disclose and discuss suicidal ideas Ability to demonstrate self-control will improve Compliance with prescribed medications will improve  Medication Management: Evaluate patient's response, side effects, and tolerance of medication regimen.  Therapeutic Interventions: 1 to 1 sessions, Unit Group sessions and Medication administration.  Evaluation of Outcomes: Progressing   RN  Treatment Plan for Primary Diagnosis: Schizoaffective disorder, bipolar type ([place]) Long Term Goal(s): Knowledge of disease and therapeutic regimen to maintain health will improve  Short Term Goals: Ability to identify and develop effective coping behaviors will improve and Compliance with prescribed medications will improve  Medication Management: RN will administer medications as ordered by provider, will assess and evaluate patient's response and provide education to patient for prescribed medication. RN will report any adverse and/or side effects to prescribing provider.  Therapeutic Interventions: 1 on 1 counseling sessions, Psychoeducation, Medication administration, Evaluate responses to treatment, Monitor vital signs and CBGs as ordered, Perform/monitor CIWA, COWS, AIMS and Fall Risk screenings as ordered, Perform wound care treatments as ordered.  Evaluation of Outcomes: Progressing   LCSW Treatment Plan for Primary Diagnosis: Schizoaffective disorder, bipolar type ([place]) Long Term Goal(s): Safe transition to appropriate next level of care at discharge, Engage patient in therapeutic group addressing interpersonal concerns.  Short Term Goals: Engage patient in aftercare planning with referrals and resources  Therapeutic Interventions: Assess for all discharge needs, 1 to 1 time with Social worker, Explore available resources and support systems, Assess for adequacy in community  support network, Educate family and significant other(s) on suicide prevention, Complete Psychosocial Assessment, Interpersonal group therapy.  Evaluation of Outcomes: Met  Return home, follow up [facility]   Progress in Treatment: Attending groups: Yes Participating in groups: Yes Taking medication as prescribed: Yes Toleration medication: Yes, no side effects reported at this time Family/Significant other contact made: No Patient understands diagnosis: Yes AEB "I need to go to [facility]" Discussing patient  identified problems/goals with staff: Yes Medical problems stabilized or resolved: Yes Denies suicidal/homicidal ideation: Yes Issues/concerns per patient self-inventory: None Other: N/A  New problem(s) identified: None identified at this time.   New Short Term/Long Term Goal(s): "II need verbal assistance with figuring out what will work for me outside of the hospital.  My dad used to help me until he died 2 years ago, and now I don't really have anyone. I think I should go to [facility]."  Discharge Plan or Barriers:   Reason for Continuation of Hospitalization: Paranoia Disorganization Mood Instability Suicidal Ideation Medication stabilization   Estimated Length of Stay: 3-5 days  Attendees: Patient: Meghan Hensley 03/19/2017  1:09 PM  Physician: Ursula Alert, MD 03/19/2017  1:09 PM  Nursing: Sena Hitch, RN 03/19/2017  1:09 PM  RN Care Manager: Lars Pinks, RN 03/19/2017  1:09 PM  Social Worker: Ripley Fraise 03/19/2017  1:09 PM  Recreational Therapist: Winfield Cunas 03/19/2017  1:09 PM  Other: Norberto Sorenson 03/19/2017  1:09 PM  Other:  03/19/2017  1:09 PM    Scribe for Treatment Team:  Roque Lias LCSW 03/19/2017 1:09 PM

## 2017-03-20 LAB — LIPID PANEL
CHOLESTEROL: 215 mg/dL — AB (ref 0–200)
HDL: 42 mg/dL (ref >40)
LDL Cholesterol: 129 mg/dL — ABNORMAL HIGH (ref 0–99)
Total CHOL/HDL Ratio: 5.1 RATIO
Triglycerides: 218 mg/dL — ABNORMAL HIGH (ref <150)
VLDL: 44 mg/dL — ABNORMAL HIGH (ref 0–40)

## 2017-03-20 LAB — HEMOGLOBIN A1C
Hgb A1c MFr Bld: 5.1 % (ref 4.8–5.6)
MEAN PLASMA GLUCOSE: 99.67 mg/dL

## 2017-03-20 MED ORDER — METHOCARBAMOL 500 MG PO TABS
500.0000 mg | ORAL_TABLET | Freq: Three times a day (TID) | ORAL | Status: AC
  Administered 2017-03-20 – 2017-03-23 (×9): 500 mg via ORAL
  Filled 2017-03-20 (×9): qty 1

## 2017-03-20 MED ORDER — IBUPROFEN 800 MG PO TABS
800.0000 mg | ORAL_TABLET | Freq: Three times a day (TID) | ORAL | Status: AC
  Administered 2017-03-20 – 2017-03-23 (×9): 800 mg via ORAL
  Filled 2017-03-20 (×10): qty 1

## 2017-03-20 MED ORDER — TRAZODONE HCL 50 MG PO TABS
75.0000 mg | ORAL_TABLET | Freq: Every day | ORAL | Status: DC
  Administered 2017-03-20 – 2017-03-22 (×3): 75 mg via ORAL
  Filled 2017-03-20 (×6): qty 1

## 2017-03-20 NOTE — Progress Notes (Signed)
Patient ID: Meghan Hensley, female   DOB: Mar 03, 1971, 46 y.o.   MRN: [id number]  D: Patient has been in bed since the beginning of writer's shift. Pt reports the effects of her medication has worned off and needs medication readjustment. Pt mood and affect appeared depressed and anxious. Pt stayed in the dayroom and watched TV a little while going to bed. Denies SI/HI/AVH and pain.No behavioral issues noted.  A: Support and encouragement offered as needed to express needs. Medications administered as prescribed.  R: Patient is safe and cooperative on unit. Will continue to monitor  for safety and stability.

## 2017-03-20 NOTE — [facility] Group Notes (Signed)
[facility] LCSW Group Therapy  03/20/2017 1:15 pm  Type of Therapy: Process Group Therapy  Participation Level:  Active  Participation Quality:  Appropriate  Affect:  Flat  Cognitive:  Oriented  Insight:  Improving  Engagement in Group:  Limited  Engagement in Therapy:  Limited  Modes of Intervention:  Activity, Clarification, Education, Problem-solving and Support  Summary of Progress/Problems: Today's group addressed the issue of overcoming obstacles.  Patients were asked to identify their biggest obstacle post d/c that stands in the way of their on-going success, and then problem solve as to how to manage this. Stayed the entire time, engaged throughout.  "I guess my biggest obstacle is not having the support from my family that I need.  My father is dead and my mother is turning her back on me, though she also has dementia."  When asked about alternatives, she stated she recently attended a church that has potential, and she wants to get linked with Berenice Primas, [place] Distance B 03/20/2017   3:26 PM

## 2017-03-20 NOTE — Progress Notes (Signed)
Recreation Therapy Notes  INPATIENT RECREATION THERAPY ASSESSMENT  Patient Details Name: Jowanda Kempf MRN: [id number] DOB: 07-23-1971 Today's Date: 29-Mar-2017  Patient Stressors: Death, Other (Comment) (Living situation)  Pt stated she was here because her thyroid disease had been acting up, stopped taking her medication, depression, negative thoughts about self, bad living situation and suicidal thoughts. Pt stated her dad died 2 years ago.  Coping Skills:   Avoidance, Exercise, Art/Dance, Talking, Music  Personal Challenges: Communication, Concentration, Expressing Yourself, Trusting Others  Leisure Interests (2+):  Exercise - Walking, Sports - Basketball, [employer] - Other (Comment) (Theme parks)  Awareness of Community Resources:  No  Patient Strengths:  Ability to cope with stress, outgoing with people  Patient Identified Areas of Improvement:  Trusting others more, physical health (eating better)  Current Recreation Participation:  2 hrs a month  Patient Goal for Hospitalization:  "Need to work on hopelessness and helplessness"  [place] of Residence:  [place] of Residence:  [place]  Current SI (including self-harm):  Yes (Rated a 7 or 8; contracted for safety)  Current HI:  No  Consent to Intern Participation: N/A   Caroll Rancher, LRT/CTRS  Caroll Rancher A 03-29-2017, 12:25 PM

## 2017-03-20 NOTE — Progress Notes (Signed)
DAR NOTE: Patient presents with flat affect and depressed mood.  Reports pain and auditory hallucination.  Reports suicidal thoughts but contracts for safety.  Rates depression at 7, hopelessness at 7, and anxiety at 7.  Maintained on routine safety checks.  Medications given as prescribed.  Support and encouragement offered as needed.  Attended group and participated.  States goal for today is "getting doctors name."  Patient observed socializing with peers in the dayroom.

## 2017-03-20 NOTE — Progress Notes (Signed)
Recreation Therapy Notes  Date: 03/20/17 Time: 1000 Location: [address] Dayroom  Group Topic: Leisure Education  Goal Area(s) Addresses:  Patient will identify positive leisure activities.  Patient will identify one positive benefit of participation in leisure activities.   Behavioral Response: Engaged  Intervention: Tin can with activities, white board, dry erase marker, eraser  Activity: Leisure IT trainer.  Patients had to pick a slip of paper from the tin can.  Each slip of paper had a leisure activity on it.  LRT divided the group into two groups.  Patients were to draw a picture of what was on their slip of paper.  Their team would get a minute to guess the picture.  If their team did not guess correctly, the other team got a chance to still the point.  The team with the most points won.  Education:  Leisure Education, Building control surveyor  Education Outcome: Acknowledges education/In group clarification offered/Needs additional education  Clinical Observations/Feedback: Pt was engaged and active throughout group.  Pt was bright and pleasant throughout group.  Pt stated not using leisure "builds up stress".  Pt stated doing leisure helps reduce stress.    Caroll Rancher, LRT/CTRS         Caroll Rancher A 03/20/2017 11:56 AM

## 2017-03-20 NOTE — Plan of Care (Signed)
Problem: Safety: Goal: Periods of time without injury will increase Outcome: Progressing Remains a low fall risk, passive for SI but verbal contracts for safety.

## 2017-03-20 NOTE — Progress Notes (Signed)
Recreation Therapy Notes  Animal-Assisted Activity (AAA) Program Checklist/Progress Notes Patient Eligibility Criteria Checklist & Daily Group note for Rec Tx Intervention  Date: 08.21.2018 Time: 2:45pm Location: [address]    AAA/T Program Assumption of Risk Form signed by Patient/ or Parent Legal Guardian No  Clinical Observations/Feedback: Patient discussed with MD for appropriateness in pet therapy session. Both LRT and MD agree patient is appropriate for participation. Patient offered participation in session, however politely declined invitation. No consent form signed at this time.   Marykay Lex Candon Caras, LRT/CTRS        Hommer Cunliffe L 03/20/2017 3:17 PM

## 2017-03-20 NOTE — Progress Notes (Addendum)
  DATA ACTION RESPONSE  Objective- Pt. is visible in the room seen pacing in the room. Presents with an anxious/depressed affect and mood. Appropriate with interaction. No abnormal s/s.  Subjective- Denies having HI/AVH at this time. Passive SI but verbal contracts for safety. Rates pain 8/10; radiating from buttocks to foot. Pt. states " I know why I'm here; I have low energy". Is cooperative and remain safe on the unit.  1:1 interaction in private to establish rapport. Encouragement, education, & support given from staff.  PRN Tylenol requested and will re-eval accordingly.   Safety maintained with Q 15 checks. Continue with POC.

## 2017-03-20 NOTE — Plan of Care (Signed)
Problem: Health Behavior/Discharge Planning: Goal: Compliance with therapeutic regimen will improve Outcome: Progressing Patient is compliant with therapeutic regimen.  Patient is compliant with prescribed medication and attended group on the unit.

## 2017-03-20 NOTE — Progress Notes (Signed)
[facility] MD Progress Note  03/20/2017 3:46 PM Meghan Hensley  MRN:  [id number] Subjective: Pt states " I am restless, I still feel depressed , I am in pain. I have this pain that is making me more depressed.'     Objective:Patient seen and chart reviewed.Discussed patient with treatment team.  Pt today seen in bed , continues to report sadness , anxiety as well as restless sleep. Pt reports she feels this way since she is also in pain. She described pain in her lower back radiating to her thighs . Pt was able to bear weight on her BL legs as well as her leg raising did not make her pain worse. Pt offered sx treatment for pain. Pt continues to endorse SI . Pt has been compliant on her medications , continue to need support.        Principal Problem: Schizoaffective disorder, bipolar type (HCC) Diagnosis:   Patient Active Problem List   Diagnosis Date Noted  . Akathisia [G25.71] 03/03/2016  . Hypothyroidism [E03.9] 02/21/2016  . Bilateral edema of lower extremity [R60.0] 02/21/2016  . Schizoaffective disorder, bipolar type (HCC) [F25.0] 02/17/2016   Total Time spent with patient: 30 minutes  Past Psychiatric History: Please see H&P.   Past Medical History:  Past Medical History:  Diagnosis Date  . Bipolar 1 disorder (HCC)   . Schizophrenia (HCC)   . Thyroid disease    History reviewed. No pertinent surgical history. Family History:  Family History  Problem Relation Age of Onset  . Cancer Father   . Mental illness Neg Hx    Family Psychiatric  History: Please see H&P.  Social History: Please see H&P.  History  Alcohol Use No     History  Drug Use No    Social History   Social History  . Marital status: Divorced    Spouse name: N/A  . Number of children: N/A  . Years of education: N/A   Social History Main Topics  . Smoking status: Former Games developer  . Smokeless tobacco: Never Used  . Alcohol use No  . Drug use: No  . Sexual activity: Not Currently   Other  Topics Concern  . None   Social History Narrative  . None   Additional Social History:                         Sleep: restless  Appetite:  Fair  Current Medications: Current Facility-Administered Medications  Medication Dose Route Frequency Provider Last Rate Last Dose  . acetaminophen (TYLENOL) tablet 650 mg  650 mg Oral Q6H PRN Laveda Abbe, NP   650 mg at 03/20/17 1316  . alum & mag hydroxide-simeth (MAALOX/MYLANTA) 200-200-20 MG/5ML suspension 30 mL  30 mL Oral Q4H PRN Laveda Abbe, NP   30 mL at 03/18/17 2001  . ARIPiprazole (ABILIFY) tablet 5 mg  5 mg Oral Daily Darlene Bartelt, Levin Bacon, MD   5 mg at 03/20/17 0805  . hydrOXYzine (ATARAX/VISTARIL) tablet 25 mg  25 mg Oral Q6H PRN Laveda Abbe, NP   25 mg at 03/19/17 2201  . ibuprofen (ADVIL,MOTRIN) tablet 800 mg  800 mg Oral TID Jomarie Longs, MD      . lamoTRIgine (LAMICTAL) tablet 25 mg  25 mg Oral Daily Journee Kohen, Levin Bacon, MD   25 mg at 03/20/17 0805  . levothyroxine (SYNTHROID, LEVOTHROID) tablet 250 mcg  250 mcg Oral QAC breakfast Laveda Abbe, NP   250 mcg at 03/20/17 6195  .  LORazepam (ATIVAN) tablet 1 mg  1 mg Oral Q6H PRN Jomarie Longs, MD       Or  . LORazepam (ATIVAN) injection 1 mg  1 mg Intramuscular Q6H PRN Kirrah Mustin, MD      . magnesium hydroxide (MILK OF MAGNESIA) suspension 30 mL  30 mL Oral Daily PRN Laveda Abbe, NP      . methocarbamol (ROBAXIN) tablet 500 mg  500 mg Oral TID Jomarie Longs, MD      . pantoprazole (PROTONIX) EC tablet 40 mg  40 mg Oral BID AC Laveda Abbe, NP   40 mg at 03/20/17 5784  . traZODone (DESYREL) tablet 50 mg  50 mg Oral QHS PRN Jomarie Longs, MD   50 mg at 03/19/17 2201  . trihexyphenidyl (ARTANE) tablet 2 mg  2 mg Oral QHS Laveda Abbe, NP   2 mg at 03/19/17 2201  . zolpidem (AMBIEN) tablet 5 mg  5 mg Oral QHS Jomarie Longs, MD        Lab Results:  Results for orders placed or performed during the  hospital encounter of 03/17/17 (from the past 48 hour(s))  Lipid panel     Status: Abnormal   Collection Time: 03/20/17  6:30 AM  Result Value Ref Range   Cholesterol 215 (H) 0 - 200 mg/dL   Triglycerides 696 (H) <150 mg/dL   HDL 42 >29 mg/dL   Total CHOL/HDL Ratio 5.1 RATIO   VLDL 44 (H) 0 - 40 mg/dL   LDL Cholesterol 528 (H) 0 - 99 mg/dL    Comment:        Total Cholesterol/HDL:CHD Risk Coronary Heart Disease Risk Table                     Men   Women  1/2 Average Risk   3.4   3.3  Average Risk       5.0   4.4  2 X Average Risk   9.6   7.1  3 X Average Risk  23.4   11.0        Use the calculated Patient Ratio above and the CHD Risk Table to determine the patient's CHD Risk.        ATP III CLASSIFICATION (LDL):  <100     mg/dL   Optimal  413-244  mg/dL   Near or Above                    Optimal  130-159  mg/dL   Borderline  010-272  mg/dL   High  >536     mg/dL   Very High Performed at [facility], [address], [place], [place] [address]   Hemoglobin A1c     Status: None   Collection Time: 03/20/17  6:30 AM  Result Value Ref Range   Hgb A1c MFr Bld 5.1 4.8 - 5.6 %    Comment: (NOTE) Pre diabetes:          5.7%-6.4% Diabetes:              >6.4% Glycemic control for   <7.0% adults with diabetes    Mean Plasma Glucose 99.67 mg/dL    Comment: Performed at [facility], [address] [place], [place] [address]    Blood Alcohol level:  Lab Results  Component Value Date   [facility] <5 03/17/2017   ETH <5 03/13/2016    Metabolic Disorder Labs: Lab Results  Component  Value Date   HGBA1C 5.1 03/20/2017   MPG 99.67 03/20/2017   MPG 100 03/01/2016   No results found for: PROLACTIN Lab Results  Component Value Date   CHOL 215 (H) 03/20/2017   TRIG 218 (H) 03/20/2017   HDL 42 03/20/2017   CHOLHDL 5.1 03/20/2017   VLDL 44 (H) 03/20/2017   LDLCALC 129 (H) 03/20/2017   LDLCALC 86 03/01/2016    Physical Findings: AIMS:  , ,  ,  ,    CIWA:    COWS:      Musculoskeletal: Strength & Muscle Tone: within normal limits Gait & Station: normal Patient leans: N/A  Psychiatric Specialty Exam: Physical Exam  Nursing note and vitals reviewed.   Review of Systems  Musculoskeletal: Positive for back pain.  Psychiatric/Behavioral: Positive for depression and suicidal ideas. The patient is nervous/anxious.   All other systems reviewed and are negative.   Blood pressure (!) 113/53, pulse 96, temperature 99.3 F (37.4 C), temperature source Oral, resp. rate 16, height 5\' 10"  (1.778 m), weight (!) 145.2 kg (320 lb), SpO2 97 %.Body mass index is 45.92 kg/m.  General Appearance: Guarded  Eye Contact:  Fair  Speech:  Normal Rate  Volume:  Decreased  Mood:  Anxious and Depressed  Affect:  Labile  Thought Process:  Goal Directed and Descriptions of Associations: Circumstantial  Orientation:  Full (Time, Place, and Person)  Thought Content:  Paranoid Ideation  Suicidal Thoughts:  Yes.  without intent/plan  Homicidal Thoughts:  No  Memory:  Immediate;   Fair Recent;   Fair Remote;   Fair  Judgement:  Impaired  Insight:  Fair  Psychomotor Activity:  Decreased  Concentration:  Concentration: Fair and Attention Span: Fair  Recall:  [employer] of Knowledge:  Fair  Language:  Fair  Akathisia:  No  Handed:  Right  AIMS (if indicated):     Assets:  Desire for Improvement  ADL's:  Intact  Cognition:  WNL  Sleep:  Number of Hours: 4   Will continue today 03/20/17 plan as below except where it is noted.   Treatment Plan Summary:Rubi presented with depression and SI , continues to need medication readjsutments , reports her depression continues to be severe and she continues to have SI along with severe pain on her lower back . Continue to treat.    Daily contact with patient to assess and evaluate symptoms and progress in treatment, Medication management and Plan see below  Continue Lamictal 25 mg po daily for mood sx. Continue Abilify 5  mg po daily for affective sx, psychosis. But will be cautious since has hx of QT prolongation. Patient refused ambien last night. Will increase Trazodone to 75 mg po qhs tonight for sleep. Continue Artane 2 mg po qhs for EPS. Restarted Synthroid 250 mcg daily for thyroid problem.  Reviewed labs - lipid panel - abnormal - diet management , pending hba1c, pl. EKG - with hx of qt prolongation, most recent qt - 466 , will repeat since she continues to be on medications that can affect it. Symptomatic treatment offered for pain management. CSW will continue to work on disposition.    Cindia Hustead, MD 03/20/2017, 3:46 PM

## 2017-03-21 LAB — GLUCOSE, CAPILLARY
GLUCOSE-CAPILLARY: 94 mg/dL (ref 65–99)
Glucose-Capillary: 124 mg/dL — ABNORMAL HIGH (ref 65–99)

## 2017-03-21 LAB — PROLACTIN: PROLACTIN: 23.5 ng/mL — AB (ref 4.8–23.3)

## 2017-03-21 MED ORDER — ARIPIPRAZOLE 15 MG PO TABS
7.5000 mg | ORAL_TABLET | Freq: Every day | ORAL | Status: DC
  Administered 2017-03-22: 7.5 mg via ORAL
  Filled 2017-03-21 (×4): qty 1

## 2017-03-21 NOTE — Progress Notes (Signed)
Patient ID: Meghan Hensley, female   DOB: 1971/07/26, 46 y.o.   MRN: [id number] PER STATE REGULATIONS 482.30  THIS CHART WAS REVIEWED FOR MEDICAL NECESSITY WITH RESPECT TO THE PATIENT'S ADMISSION/ DURATION OF STAY.  NEXT REVIEW DATE: 03/25/2017  Willa Rough, RN, BSN CASE MANAGER

## 2017-03-21 NOTE — Progress Notes (Signed)
Adult Psychoeducational Group Note  Date:  03/21/2017 Time:  12:33 AM  Group Topic/Focus:  Wrap-Up Group:   The focus of this group is to help patients review their daily goal of treatment and discuss progress on daily workbooks.  Participation Level:  Active  Participation Quality:  Appropriate  Affect:  Appropriate  Cognitive:  Appropriate  Insight: Appropriate and Improving  Engagement in Group:  Engaged  Modes of Intervention:  Discussion  Additional Comments:  Pt her goal for today was to talk with her doctor about other out patient treatment programs that she can attend. Pt stated she accomplished her goal today. Pt rated her overall day a 4. Pt stated she attended all groups.  Felipa Furnace 03/21/2017, 12:33 AM

## 2017-03-21 NOTE — Progress Notes (Signed)
Did not attend group 

## 2017-03-21 NOTE — Progress Notes (Signed)
DAR NOTE: Pt present with flat affect and depressed mood in the unit. Pt has been isolating herself in the room but would come out for meds, meals and groups. Pt has been sharing much with both staff and peers.. Pt complained of right leg pain, took all her meds as scheduled. As per self inventory, pt had a fair night sleep, fair appetite, low energy, and poor concentration. Pt rate depression at 7, hopeless ness at 7, and anxiety at 7. Pt's safety ensured with 15 minute and environmental checks. Pt currently denies SI/HI and A/V hallucinations. Pt verbally agrees to seek staff if SI/HI or A/VH occurs and to consult with staff before acting on these thoughts. Will continue POC.

## 2017-03-21 NOTE — Progress Notes (Signed)
[facility] MD Progress Note  03/21/2017 4:22 PM Ana Schmuhl  MRN:  [id number] Subjective: Pt states " I so overwhelmed that I still feel suicidal. I have this pain,its getting better , but it is still overwhelming."      Objective:Patient seen and chart reviewed.Discussed patient with treatment team.  Pt seen as anxious , sad , preoccupied with her pain. Pt received sx treatment - on motrin and flexeril. Reports pain as better . Pt continues to have SI. Reports she needs more time with medications. Continue to encourage and support.         Principal Problem: Schizoaffective disorder, bipolar type (HCC) Diagnosis:   Patient Active Problem List   Diagnosis Date Noted  . Akathisia [G25.71] 03/03/2016  . Hypothyroidism [E03.9] 02/21/2016  . Bilateral edema of lower extremity [R60.0] 02/21/2016  . Schizoaffective disorder, bipolar type (HCC) [F25.0] 02/17/2016   Total Time spent with patient: 25 minutes  Past Psychiatric History: Please see H&P.   Past Medical History:  Past Medical History:  Diagnosis Date  . Bipolar 1 disorder (HCC)   . Schizophrenia (HCC)   . Thyroid disease    History reviewed. No pertinent surgical history. Family History:  Family History  Problem Relation Age of Onset  . Cancer Father   . Mental illness Neg Hx    Family Psychiatric  History: Please see H&P.  Social History: Please see H&P.  History  Alcohol Use No     History  Drug Use No    Social History   Social History  . Marital status: Divorced    Spouse name: N/A  . Number of children: N/A  . Years of education: N/A   Social History Main Topics  . Smoking status: Former Games developer  . Smokeless tobacco: Never Used  . Alcohol use No  . Drug use: No  . Sexual activity: Not Currently   Other Topics Concern  . None   Social History Narrative  . None   Additional Social History:                         Sleep: Fair  Appetite:  Fair  Current  Medications: Current Facility-Administered Medications  Medication Dose Route Frequency Provider Last Rate Last Dose  . acetaminophen (TYLENOL) tablet 650 mg  650 mg Oral Q6H PRN Laveda Abbe, NP   650 mg at 03/20/17 2136  . alum & mag hydroxide-simeth (MAALOX/MYLANTA) 200-200-20 MG/5ML suspension 30 mL  30 mL Oral Q4H PRN Laveda Abbe, NP   30 mL at 03/18/17 2001  . [START ON 03/22/2017] ARIPiprazole (ABILIFY) tablet 7.5 mg  7.5 mg Oral Daily Shantil Vallejo, MD      . hydrOXYzine (ATARAX/VISTARIL) tablet 25 mg  25 mg Oral Q6H PRN Laveda Abbe, NP   25 mg at 03/20/17 2358  . ibuprofen (ADVIL,MOTRIN) tablet 800 mg  800 mg Oral TID Jomarie Longs, MD   800 mg at 03/21/17 1257  . lamoTRIgine (LAMICTAL) tablet 25 mg  25 mg Oral Daily Zyiere Rosemond, MD   25 mg at 03/21/17 0830  . levothyroxine (SYNTHROID, LEVOTHROID) tablet 250 mcg  250 mcg Oral QAC breakfast Laveda Abbe, NP   250 mcg at 03/21/17 4481  . LORazepam (ATIVAN) tablet 1 mg  1 mg Oral Q6H PRN Jomarie Longs, MD       Or  . LORazepam (ATIVAN) injection 1 mg  1 mg Intramuscular Q6H PRN Jomarie Longs, MD      .  magnesium hydroxide (MILK OF MAGNESIA) suspension 30 mL  30 mL Oral Daily PRN Laveda Abbe, NP      . methocarbamol (ROBAXIN) tablet 500 mg  500 mg Oral TID Jomarie Longs, MD   500 mg at 03/21/17 1257  . pantoprazole (PROTONIX) EC tablet 40 mg  40 mg Oral BID AC Laveda Abbe, NP   40 mg at 03/21/17 0604  . traZODone (DESYREL) tablet 75 mg  75 mg Oral QHS Jomarie Longs, MD   75 mg at 03/20/17 2136  . trihexyphenidyl (ARTANE) tablet 2 mg  2 mg Oral QHS Laveda Abbe, NP   2 mg at 03/20/17 2137    Lab Results:  Results for orders placed or performed during the hospital encounter of 03/17/17 (from the past 48 hour(s))  Lipid panel     Status: Abnormal   Collection Time: 03/20/17  6:30 AM  Result Value Ref Range   Cholesterol 215 (H) 0 - 200 mg/dL   Triglycerides  147 (H) <150 mg/dL   HDL 42 >82 mg/dL   Total CHOL/HDL Ratio 5.1 RATIO   VLDL 44 (H) 0 - 40 mg/dL   LDL Cholesterol 956 (H) 0 - 99 mg/dL    Comment:        Total Cholesterol/HDL:CHD Risk Coronary Heart Disease Risk Table                     Men   Women  1/2 Average Risk   3.4   3.3  Average Risk       5.0   4.4  2 X Average Risk   9.6   7.1  3 X Average Risk  23.4   11.0        Use the calculated Patient Ratio above and the CHD Risk Table to determine the patient's CHD Risk.        ATP III CLASSIFICATION (LDL):  <100     mg/dL   Optimal  213-086  mg/dL   Near or Above                    Optimal  130-159  mg/dL   Borderline  578-469  mg/dL   High  >629     mg/dL   Very High Performed at [facility], [address] [place], [place] [address]   Hemoglobin A1c     Status: None   Collection Time: 03/20/17  6:30 AM  Result Value Ref Range   Hgb A1c MFr Bld 5.1 4.8 - 5.6 %    Comment: (NOTE) Pre diabetes:          5.7%-6.4% Diabetes:              >6.4% Glycemic control for   <7.0% adults with diabetes    Mean Plasma Glucose 99.67 mg/dL    Comment: Performed at [facility], [address] [place], [place] [address]  Prolactin     Status: Abnormal   Collection Time: 03/20/17  6:30 AM  Result Value Ref Range   Prolactin 23.5 (H) 4.8 - 23.3 ng/mL    Comment: (NOTE) Performed At: [facility] [address] [place], [place] [id number] Mila Homer MD [id number] Performed at [facility], [address], [place], [place] [address]   Glucose, capillary     Status: None   Collection Time: 03/21/17 11:48 AM  Result Value Ref Range   Glucose-Capillary 94 65 - 99  mg/dL   Comment 1 Notify RN    Comment 2 Document in Chart     Blood Alcohol level:  Lab Results  Component Value Date   ETH <5 03/17/2017   ETH <5 03/13/2016    Metabolic Disorder Labs: Lab Results  Component Value Date   HGBA1C 5.1 03/20/2017   MPG 99.67  03/20/2017   MPG 100 03/01/2016   Lab Results  Component Value Date   PROLACTIN 23.5 (H) 03/20/2017   Lab Results  Component Value Date   CHOL 215 (H) 03/20/2017   TRIG 218 (H) 03/20/2017   HDL 42 03/20/2017   CHOLHDL 5.1 03/20/2017   VLDL 44 (H) 03/20/2017   LDLCALC 129 (H) 03/20/2017   LDLCALC 86 03/01/2016    Physical Findings: AIMS: Facial and Oral Movements Muscles of Facial Expression: None, normal Lips and Perioral Area: None, normal Jaw: None, normal Tongue: None, normal,Extremity Movements Upper (arms, wrists, hands, fingers): None, normal Lower (legs, knees, ankles, toes): None, normal, Trunk Movements Neck, shoulders, hips: None, normal, Overall Severity Severity of abnormal movements (highest score from questions above): None, normal Incapacitation due to abnormal movements: None, normal Patient's awareness of abnormal movements (rate only patient's report): No Awareness, Dental Status Current problems with teeth and/or dentures?: No Does patient usually wear dentures?: No  CIWA:    COWS:     Musculoskeletal: Strength & Muscle Tone: within normal limits Gait & Station: normal Patient leans: N/A  Psychiatric Specialty Exam: Physical Exam  Nursing note and vitals reviewed.   Review of Systems  Musculoskeletal: Positive for back pain.  Psychiatric/Behavioral: Positive for depression and suicidal ideas. The patient is nervous/anxious.   All other systems reviewed and are negative.   Blood pressure (!) 120/91, pulse 100, temperature 98.9 F (37.2 C), temperature source Oral, resp. rate 20, height 5\' 10"  (1.778 m), weight (!) 145.2 kg (320 lb), SpO2 97 %.Body mass index is 45.92 kg/m.  General Appearance: Guarded  Eye Contact:  Fair  Speech:  Normal Rate  Volume:  Decreased  Mood:  Anxious and Depressed  Affect:  Labile  Thought Process:  Goal Directed and Descriptions of Associations: Circumstantial  Orientation:  Full (Time, Place, and Person)   Thought Content:  Paranoid Ideation  Suicidal Thoughts:  Yes.  without intent/plan, contracts for safety   Homicidal Thoughts:  No  Memory:  Immediate;   Fair Recent;   Fair Remote;   Fair  Judgement:  Impaired  Insight:  Fair  Psychomotor Activity:  Decreased  Concentration:  Concentration: Fair and Attention Span: Fair  Recall:  [employer] of Knowledge:  Fair  Language:  Fair  Akathisia:  No  Handed:  Right  AIMS (if indicated):     Assets:  Desire for Improvement  ADL's:  Intact  Cognition:  WNL  Sleep:  Number of Hours: 5   Will continue today 03/21/17 plan as below except where it is noted.   Treatment Plan Summary:Yani continues to have mood sx, SI , pain concerns , continues to need medication readjustment and group therapy.   Daily contact with patient to assess and evaluate symptoms and progress in treatment, Medication management and Plan see below  Continue Lamictal 25 mg po daily for mood sx. Increase Abilify to 7.5 mg po daily for mood sx.Pt with qt prolongation , will be cautious in inreasing dose. Will continue  Trazodone to 75 mg po qhs tonight for sleep. Continue Artane 2 mg po qhs for EPS. Restarted Synthroid 250  mcg daily for thyroid problem.  Reviewed labs - lipid panel - abnormal - diet management , pending hba1c, pl. EKG - with hx of qt prolongation,repeat qtc - today - wnl, will monitor . Symptomatic treatment offered for pain management. CSW will continue to work on disposition.    Jama Mcmiller, MD 03/21/2017, 4:22 PM

## 2017-03-21 NOTE — [facility] Group Notes (Signed)
[facility] LCSW Group Therapy  03/21/2017 2:58 PM   Type of Therapy:  Group Therapy  Participation Level:  Active  Participation Quality:  Attentive  Affect:  Appropriate  Cognitive:  Appropriate  Insight:  Improving  Engagement in Therapy:  Engaged  Modes of Intervention:  Clarification, Education, Exploration and Socialization  Summary of Progress/Problems: Today's group focused on resilience.  Meghan Hensley stayed the entire time, engaged throughout.  She took a circuitous route to make her point during discussions.  She also spoke often about how having a routine has been helpful for her. She cited examples of watching TV and church. Also writing and art, but then was dismissive of them because "they're not beneficial to everyone.  They might even hurt." [name] 03/21/2017 , 2:58 PM

## 2017-03-22 LAB — TSH: TSH: 160.53 u[IU]/mL — ABNORMAL HIGH (ref 0.350–4.500)

## 2017-03-22 MED ORDER — ARIPIPRAZOLE 10 MG PO TABS
10.0000 mg | ORAL_TABLET | Freq: Every day | ORAL | Status: DC
  Administered 2017-03-23: 10 mg via ORAL
  Filled 2017-03-22 (×3): qty 1

## 2017-03-22 NOTE — Progress Notes (Signed)
[facility] MD Progress Note  03/22/2017 4:46 PM Meghan Hensley  MRN:  [id number] Subjective:  Patient is a 46 year old female well known to this service, diagnosed with schizoaffective disorder who was seen today Patient states that she's not suicidal, homicidal, is not hallucinating but feels that she has a thyroid problem and she needs to stay in the hospital. Discussed with patient that her TSH was elevated, and that we could get her an appointment with an endocrinologist and primary care doctor on discharge for follow-up.   Discussed with patient that was important for her to work on following up regularly at [facility] so she can access services and be stable outpatient. Principal Problem: Schizoaffective disorder, bipolar type (HCC) Diagnosis:   Patient Active Problem List   Diagnosis Date Noted  . Akathisia [G25.71] 03/03/2016  . Hypothyroidism [E03.9] 02/21/2016  . Bilateral edema of lower extremity [R60.0] 02/21/2016  . Schizoaffective disorder, bipolar type (HCC) [F25.0] 02/17/2016   Total Time spent with patient: 30 minutes  Past Psychiatric History: Unchanged  Past Medical History:  Past Medical History:  Diagnosis Date  . Bipolar 1 disorder (HCC)   . Schizophrenia (HCC)   . Thyroid disease    History reviewed. No pertinent surgical history. Family History:  Family History  Problem Relation Age of Onset  . Cancer Father   . Mental illness Neg Hx    Family Psychiatric  History: Unchanged Social History:  History  Alcohol Use No     History  Drug Use No    Social History   Social History  . Marital status: Divorced    Spouse name: N/A  . Number of children: N/A  . Years of education: N/A   Social History Main Topics  . Smoking status: Former Games developer  . Smokeless tobacco: Never Used  . Alcohol use No  . Drug use: No  . Sexual activity: Not Currently   Other Topics Concern  . None   Social History Narrative  . None   Additional Social History:                         Sleep: Poor  Appetite:  Good  Current Medications: Current Facility-Administered Medications  Medication Dose Route Frequency Provider Last Rate Last Dose  . acetaminophen (TYLENOL) tablet 650 mg  650 mg Oral Q6H PRN Laveda Abbe, NP   650 mg at 03/20/17 2136  . alum & mag hydroxide-simeth (MAALOX/MYLANTA) 200-200-20 MG/5ML suspension 30 mL  30 mL Oral Q4H PRN Laveda Abbe, NP   30 mL at 03/18/17 2001  . ARIPiprazole (ABILIFY) tablet 7.5 mg  7.5 mg Oral Daily Eappen, Saramma, MD   7.5 mg at 03/22/17 0705  . hydrOXYzine (ATARAX/VISTARIL) tablet 25 mg  25 mg Oral Q6H PRN Laveda Abbe, NP   25 mg at 03/22/17 0136  . ibuprofen (ADVIL,MOTRIN) tablet 800 mg  800 mg Oral TID Jomarie Longs, MD   800 mg at 03/22/17 1156  . lamoTRIgine (LAMICTAL) tablet 25 mg  25 mg Oral Daily Jomarie Longs, MD   25 mg at 03/22/17 0826  . levothyroxine (SYNTHROID, LEVOTHROID) tablet 250 mcg  250 mcg Oral QAC breakfast Laveda Abbe, NP   250 mcg at 03/22/17 [phone]  . LORazepam (ATIVAN) tablet 1 mg  1 mg Oral Q6H PRN Jomarie Longs, MD       Or  . LORazepam (ATIVAN) injection 1 mg  1 mg Intramuscular Q6H PRN Jomarie Longs, MD      .  magnesium hydroxide (MILK OF MAGNESIA) suspension 30 mL  30 mL Oral Daily PRN Laveda Abbe, NP      . methocarbamol (ROBAXIN) tablet 500 mg  500 mg Oral TID Jomarie Longs, MD   500 mg at 03/22/17 1156  . pantoprazole (PROTONIX) EC tablet 40 mg  40 mg Oral BID AC Laveda Abbe, NP   40 mg at 03/22/17 1610  . traZODone (DESYREL) tablet 75 mg  75 mg Oral QHS Jomarie Longs, MD   75 mg at 03/21/17 2126  . trihexyphenidyl (ARTANE) tablet 2 mg  2 mg Oral QHS Laveda Abbe, NP   2 mg at 03/21/17 2126    Lab Results:  Results for orders placed or performed during the hospital encounter of 03/17/17 (from the past 48 hour(s))  Glucose, capillary     Status: None   Collection Time: 03/21/17 11:48 AM  Result  Value Ref Range   Glucose-Capillary 94 65 - 99 mg/dL   Comment 1 Notify RN    Comment 2 Document in Chart   Glucose, capillary     Status: Abnormal   Collection Time: 03/21/17  4:46 PM  Result Value Ref Range   Glucose-Capillary 124 (H) 65 - 99 mg/dL   Comment 1 Notify RN    Comment 2 Document in Chart     Blood Alcohol level:  Lab Results  Component Value Date   ETH <5 03/17/2017   ETH <5 03/13/2016    Metabolic Disorder Labs: Lab Results  Component Value Date   HGBA1C 5.1 03/20/2017   MPG 99.67 03/20/2017   MPG 100 03/01/2016   Lab Results  Component Value Date   PROLACTIN 23.5 (H) 03/20/2017   Lab Results  Component Value Date   CHOL 215 (H) 03/20/2017   TRIG 218 (H) 03/20/2017   HDL 42 03/20/2017   CHOLHDL 5.1 03/20/2017   VLDL 44 (H) 03/20/2017   LDLCALC 129 (H) 03/20/2017   LDLCALC 86 03/01/2016    Physical Findings: AIMS: Facial and Oral Movements Muscles of Facial Expression: (P) None, normal Lips and Perioral Area: (P) None, normal Jaw: (P) None, normal Tongue: (P) None, normal,Extremity Movements Upper (arms, wrists, hands, fingers): (P) None, normal Lower (legs, knees, ankles, toes): (P) None, normal, Trunk Movements Neck, shoulders, hips: (P) None, normal, Overall Severity Severity of abnormal movements (highest score from questions above): (P) None, normal Incapacitation due to abnormal movements: (P) None, normal Patient's awareness of abnormal movements (rate only patient's report): (P) No Awareness, Dental Status Current problems with teeth and/or dentures?: (P) No Does patient usually wear dentures?: (P) No  CIWA:    COWS:     Musculoskeletal: Strength & Muscle Tone: within normal limits Gait & Station: normal Patient leans: N/A  Psychiatric Specialty Exam: Physical Exam  Review of Systems  Constitutional: Negative.  Negative for fever and malaise/fatigue.  HENT: Negative for congestion and sore throat.   Eyes: Negative.  Negative  for blurred vision, double vision, discharge and redness.  Respiratory: Negative for cough, shortness of breath and wheezing.   Cardiovascular: Negative for chest pain and palpitations.  Gastrointestinal: Negative.  Negative for heartburn and nausea.  Musculoskeletal: Negative for falls.  Neurological: Negative for seizures, loss of consciousness and weakness.  Psychiatric/Behavioral: Positive for depression. Negative for hallucinations, substance abuse and suicidal ideas. The patient is nervous/anxious. The patient does not have insomnia.     Blood pressure 121/77, pulse (!) 114, temperature 97.7 F (36.5 C), temperature source Oral, resp. rate  16, height 5\' 10"  (1.778 m), weight (!) 145.2 kg (320 lb), SpO2 97 %.Body mass index is 45.92 kg/m.  General Appearance: Disheveled  Eye Contact:  Good  Speech:  Clear and Coherent and Normal Rate  Volume:  Normal  Mood:  Anxious and Depressed  Affect:  Congruent and Full Range  Thought Process:  Coherent, Linear and Descriptions of Associations: Circumstantial  Orientation:  Full (Time, Place, and Person)  Thought Content:  Delusions and Rumination  Suicidal Thoughts:  No  Homicidal Thoughts:  No  Memory:  Immediate;   Fair Recent;   Fair Remote;   Fair  Judgement:  Poor  Insight:  Lacking  Psychomotor Activity:  Normal  Concentration:  Concentration: Fair and Attention Span: Fair  Recall:  [employer] of Knowledge:  Fair  Language:  Fair  Akathisia:  No  Handed:  Right  AIMS (if indicated):     Assets:  Desire for Improvement  ADL's:  Intact  Cognition:  WNL  Sleep:  Number of Hours: 6.75     Treatment Plan Summary: Daily contact with patient to assess and evaluate symptoms and progress in treatment and Medication management Increase Abilify to 10 mg daily to help with delusional thinking and anxiety. To continue hydroxyzine 25 minutes every 6 hours when necessary for anxiety To continue Lamictal 25 mg 1 daily for mood  stabilization. To continue trazodone 75 mg daily at bedtime for sleep Patient to participate in therapeutic milieu NP to see patient in regards to her hypothyroidism Discharge planning for tomorrow Nelly Rout, MD 03/22/2017, 4:46 PM

## 2017-03-22 NOTE — Tx Team (Signed)
Interdisciplinary Treatment and Diagnostic Plan Update  03/22/2017 Time of Session: 10:04 AM  Meghan Hensley MRN: [id number]  Principal Diagnosis: Schizoaffective disorder, bipolar type [facility])  Secondary Diagnoses: Principal Problem:   Schizoaffective disorder, bipolar type ([place])   Current Medications:  Current Facility-Administered Medications  Medication Dose Route Frequency Provider Last Rate Last Dose  . acetaminophen (TYLENOL) tablet 650 mg  650 mg Oral Q6H PRN Ethelene Hal, NP   650 mg at 03/20/17 2136  . alum & mag hydroxide-simeth (MAALOX/MYLANTA) 200-200-20 MG/5ML suspension 30 mL  30 mL Oral Q4H PRN Ethelene Hal, NP   30 mL at 03/18/17 2001  . ARIPiprazole (ABILIFY) tablet 7.5 mg  7.5 mg Oral Daily Eappen, Saramma, MD   7.5 mg at 03/22/17 0705  . hydrOXYzine (ATARAX/VISTARIL) tablet 25 mg  25 mg Oral Q6H PRN Ethelene Hal, NP   25 mg at 03/22/17 0136  . ibuprofen (ADVIL,MOTRIN) tablet 800 mg  800 mg Oral TID Ursula Alert, MD   800 mg at 03/22/17 0827  . lamoTRIgine (LAMICTAL) tablet 25 mg  25 mg Oral Daily Ursula Alert, MD   25 mg at 03/22/17 0826  . levothyroxine (SYNTHROID, LEVOTHROID) tablet 250 mcg  250 mcg Oral QAC breakfast Ethelene Hal, NP   250 mcg at 03/22/17 [phone]  . LORazepam (ATIVAN) tablet 1 mg  1 mg Oral Q6H PRN Ursula Alert, MD       Or  . LORazepam (ATIVAN) injection 1 mg  1 mg Intramuscular Q6H PRN Eappen, Saramma, MD      . magnesium hydroxide (MILK OF MAGNESIA) suspension 30 mL  30 mL Oral Daily PRN Ethelene Hal, NP      . methocarbamol (ROBAXIN) tablet 500 mg  500 mg Oral TID Ursula Alert, MD   500 mg at 03/22/17 0827  . pantoprazole (PROTONIX) EC tablet 40 mg  40 mg Oral BID AC Ethelene Hal, NP   40 mg at 03/22/17 4431  . traZODone (DESYREL) tablet 75 mg  75 mg Oral QHS Ursula Alert, MD   75 mg at 03/21/17 2126  . trihexyphenidyl (ARTANE) tablet 2 mg  2 mg Oral QHS Ethelene Hal, NP    2 mg at 03/21/17 2126    PTA Medications: Prescriptions Prior to Admission  Medication Sig Dispense Refill Last Dose  . antiseptic oral rinse (BIOTENE) LIQD 15 mLs by Mouth Rinse route as needed for dry mouth. (Patient not taking: Reported on 03/17/2017) 1 Bottle `0 Not Taking at Unknown time  . ARIPiprazole (ABILIFY) 5 MG tablet Take 1 tablet (5 mg total) by mouth 2 (two) times daily in the am and at bedtime.. (Patient not taking: Reported on 03/17/2017) 60 tablet 0 Not Taking at Unknown time  . divalproex (DEPAKOTE ER) 500 MG 24 hr tablet Take 2 tablets (1,000 mg total) by mouth at bedtime. (Patient not taking: Reported on 03/17/2017) 60 tablet 0 Not Taking at Unknown time  . gabapentin (NEURONTIN) 100 MG capsule Take 2 capsules (200 mg total) by mouth 2 (two) times daily. (Patient not taking: Reported on 03/17/2017) 60 capsule 0 Not Taking at Unknown time  . lamoTRIgine (LAMICTAL) 25 MG tablet Take 25 mg by mouth daily.  0 Past Month at Unknown time  . levothyroxine (SYNTHROID, LEVOTHROID) 125 MCG tablet Take 2 tablets (250 mcg total) by mouth daily before breakfast. (Patient not taking: Reported on 03/17/2017) 30 tablet 0 Not Taking at Unknown time  . levothyroxine (SYNTHROID, LEVOTHROID) 200 MCG tablet Take  200 mcg by mouth daily.  0 Past Month at Unknown time  . pantoprazole (PROTONIX) 40 MG tablet Take 1 tablet (40 mg total) by mouth 2 (two) times daily before a meal. (Patient not taking: Reported on 03/17/2017) 60 tablet 0 Not Taking at Unknown time  . sertraline (ZOLOFT) 100 MG tablet Take 100 mg by mouth every morning.  0 Past Month at Unknown time  . traZODone (DESYREL) 50 MG tablet Take 1 tablet (50 mg total) by mouth at bedtime and may repeat dose one time if needed. (Patient not taking: Reported on 03/17/2017) 30 tablet 0 Not Taking at Unknown time  . trihexyphenidyl (ARTANE) 2 MG tablet Take 1 tablet (2 mg total) by mouth at bedtime. (Patient not taking: Reported on 03/17/2017) 30 tablet 0  Not Taking at Unknown time    Patient Stressors: Health problems Medication change or noncompliance  Patient Strengths: Capable of independent living Wellsite geologist fund of knowledge Motivation for treatment/growth  Treatment Modalities: Medication Management, Group therapy, Case management,  1 to 1 session with clinician, Psychoeducation, Recreational therapy.   Physician Treatment Plan for Primary Diagnosis: Schizoaffective disorder, bipolar type (HCC) Long Term Goal(s): Improvement in symptoms so as ready for discharge  Short Term Goals: Ability to verbalize feelings will improve Ability to disclose and discuss suicidal ideas Ability to identify and develop effective coping behaviors will improve Ability to verbalize feelings will improve Ability to disclose and discuss suicidal ideas Ability to demonstrate self-control will improve Compliance with prescribed medications will improve  Medication Management: Evaluate patient's response, side effects, and tolerance of medication regimen.  Therapeutic Interventions: 1 to 1 sessions, Unit Group sessions and Medication administration.  Evaluation of Outcomes: Adequate for Discharge  Physician Treatment Plan for Secondary Diagnosis: Principal Problem:   Schizoaffective disorder, bipolar type (HCC)   Long Term Goal(s): Improvement in symptoms so as ready for discharge  Short Term Goals: Ability to verbalize feelings will improve Ability to disclose and discuss suicidal ideas Ability to identify and develop effective coping behaviors will improve Ability to verbalize feelings will improve Ability to disclose and discuss suicidal ideas Ability to demonstrate self-control will improve Compliance with prescribed medications will improve  Medication Management: Evaluate patient's response, side effects, and tolerance of medication regimen.  Therapeutic Interventions: 1 to 1 sessions, Unit Group sessions and  Medication administration.  Evaluation of Outcomes: Adequate for Discharge   RN Treatment Plan for Primary Diagnosis: Schizoaffective disorder, bipolar type (HCC) Long Term Goal(s): Knowledge of disease and therapeutic regimen to maintain health will improve  Short Term Goals: Ability to identify and develop effective coping behaviors will improve and Compliance with prescribed medications will improve  Medication Management: RN will administer medications as ordered by provider, will assess and evaluate patient's response and provide education to patient for prescribed medication. RN will report any adverse and/or side effects to prescribing provider.  Therapeutic Interventions: 1 on 1 counseling sessions, Psychoeducation, Medication administration, Evaluate responses to treatment, Monitor vital signs and CBGs as ordered, Perform/monitor CIWA, COWS, AIMS and Fall Risk screenings as ordered, Perform wound care treatments as ordered.  Evaluation of Outcomes: Adequate for Discharge   LCSW Treatment Plan for Primary Diagnosis: Schizoaffective disorder, bipolar type (HCC) Long Term Goal(s): Safe transition to appropriate next level of care at discharge, Engage patient in therapeutic group addressing interpersonal concerns.  Short Term Goals: Engage patient in aftercare planning with referrals and resources  Therapeutic Interventions: Assess for all discharge needs, 1 to 1 time with Social  worker, Explore available resources and support systems, Assess for adequacy in community support network, Educate family and significant other(s) on suicide prevention, Complete Psychosocial Assessment, Interpersonal group therapy.  Evaluation of Outcomes: Met  Return home, follow up [facility]   Progress in Treatment: Attending groups: Yes Participating in groups: Yes Taking medication as prescribed: Yes Toleration medication: Yes, no side effects reported at this time Family/Significant other contact  made: No Patient understands diagnosis: Yes AEB "I need to go to [facility]" Discussing patient identified problems/goals with staff: Yes Medical problems stabilized or resolved: Yes Denies suicidal/homicidal ideation: Yes Issues/concerns per patient self-inventory: None Other: N/A  New problem(s) identified: None identified at this time.   New Short Term/Long Term Goal(s): "II need verbal assistance with figuring out what will work for me outside of the hospital.  My dad used to help me until he died 2 years ago, and now I don't really have anyone. I think I should go to [facility]."  Discharge Plan or Barriers:   Reason for Continuation of Hospitalization:   Estimated Length of Stay: [facility] d/c today, tomorrow at the latest  Attendees: Patient:  03/22/2017  10:04 AM  Physician: Ursula Alert, MD 03/22/2017  10:04 AM  Nursing: Sena Hitch, RN 03/22/2017  10:04 AM  RN Care Manager: Lars Pinks, RN 03/22/2017  10:04 AM  Social Worker: Ripley Fraise 03/22/2017  10:04 AM  Recreational Therapist: Winfield Cunas 03/22/2017  10:04 AM  Other: Norberto Sorenson 03/22/2017  10:04 AM  Other:  03/22/2017  10:04 AM    Scribe for Treatment Team:  Roque Lias LCSW 03/22/2017 10:04 AM

## 2017-03-22 NOTE — Progress Notes (Signed)
Pt did not attend this evening's wrap up group. 

## 2017-03-22 NOTE — Progress Notes (Signed)
DAR NOTE. Pt present with flat affect and depressed mood on the unit. Pt has been peresent in the day room interacting with peers. Pt complained of not sleeping well last night, kept on waking up. Pt complained of pain on her leg while touching, but doing better with the medications she is taking. As per self inventory, pt had a poor night sleep, fair appetite, low energy, and poor concentration. Pt rate depression at 7, hopeless ness at 7, and anxiety 7. Pt's safety ensured with 15 minute and environmental checks. Pt currently denies SI/HI and A/V hallucinations. Pt verbally agrees to seek staff if SI/HI or A/VH occurs and to consult with staff before acting on these thoughts. Will continue POC.

## 2017-03-22 NOTE — BHH Group Notes (Signed)
[facility] [employer] Group Therapy  03/22/2017 , 1:38 PM    Type of Therapy:  M[employer] Presentation  Participation Level:  Active  Participation Quality:  Attentive  Affect:  Blunted  Cognitive:  Oriented  Insight:  Limited  Engagement in Therapy:  Engaged  Modes of Intervention:  Discussion, Education and Socialization  Summary of Progress/Problems:  Onalee Hua from [employer] came to present his recovery story. She came in late but apologized and asked questions to clarify what was being discussed.  Adilena shared that she has a minor in Psychology.  Marcina did not stay in group the entire time because the nurse needed her.  She was engaged the entire time until she left with the nurse.  When Makenleigh returned to group she continued to be actively engaged. She shared that the nurse informed her that she would be discharged on Friday and she feels concerned and nervous about being released from the hospital.    [name] 03/22/2017 , 1:38 PM

## 2017-03-22 NOTE — Progress Notes (Signed)
D: Patient seen on dayroom watching TV. Reports she had a good day. Denies pain, SI/HI AH/VH. Verbalizes no concern. No behavior issues noted.  A: Staff offered support and encouragement as needed. Due med given as ordered. Routine safety checks maintained. Will continue to monitor patient.   R: Patient remains safe on unit.

## 2017-03-22 NOTE — Progress Notes (Signed)
  [facility] Adult Case Management Discharge Plan :  Will you be returning to the same living situation after discharge:  Yes,  home At discharge, do you have transportation home?: Yes,  bus pass Do you have the ability to pay for your medications: Yes,  MCR   Release of information consent forms completed and in the chart;  Patient's signature needed at discharge.  Patient to Follow up at: Follow-up Information    [facility] Follow up on 03/28/2017.   Why:  Wednesday at 8AM with Archie Patten and Marcial Pacas for your hospital follow up appointment Contact information: [address] [place] [address] [phone]           Next level of care provider has access to [facility] Link:no  Safety Planning and Suicide Prevention discussed: Yes,  yes  Have you used any form of tobacco in the last 30 days? (Cigarettes, Smokeless Tobacco, Cigars, and/or Pipes): No  Has patient been referred to the Quitline?: N/A patient is not a smoker  Patient has been referred for addiction treatment: N/A  Ida Rogue, LCSW 03/22/2017, 10:33 AM

## 2017-03-23 MED ORDER — LEVOTHYROXINE SODIUM 125 MCG PO TABS: 250.0000 ug | ORAL_TABLET | Freq: Every day | ORAL | 0 refills | Status: AC

## 2017-03-23 MED ORDER — LAMOTRIGINE 25 MG PO TABS: 25.0000 mg | ORAL_TABLET | Freq: Every day | ORAL | 0 refills | Status: AC

## 2017-03-23 MED ORDER — TRAZODONE HCL 150 MG PO TABS: 75.0000 mg | ORAL_TABLET | Freq: Every day | ORAL | 0 refills | Status: AC

## 2017-03-23 MED ORDER — ARIPIPRAZOLE 10 MG PO TABS: 10.0000 mg | ORAL_TABLET | Freq: Every day | ORAL | 0 refills | Status: AC

## 2017-03-23 MED ORDER — PANTOPRAZOLE SODIUM 40 MG PO TBEC: 40.0000 mg | DELAYED_RELEASE_TABLET | Freq: Two times a day (BID) | ORAL | 0 refills | Status: AC

## 2017-03-23 MED ORDER — TRIHEXYPHENIDYL HCL 2 MG PO TABS: 2.0000 mg | ORAL_TABLET | Freq: Every day | ORAL | 0 refills | Status: AC

## 2017-03-23 MED ORDER — HYDROXYZINE HCL 25 MG PO TABS: 25.0000 mg | ORAL_TABLET | Freq: Four times a day (QID) | ORAL | 0 refills | Status: AC | PRN

## 2017-03-23 NOTE — Progress Notes (Signed)
  [facility] Adult Case Management Discharge Plan :  Will you be returning to the same living situation after discharge:  Yes,  home At discharge, do you have transportation home?: Yes,  car is at [facility] Do you have the ability to pay for your medications: Yes,  MCR  Release of information consent forms completed and in the chart;  Patient's signature needed at discharge.  Patient to Follow up at: Follow-up Information    [facility] Follow up on 03/28/2017.   Why:  Wednesday at 8AM with Archie Patten and Marcial Pacas for your hospital follow up appointment.  Transitional Care Team will begin services on the day of discharge, and they can provide transportation to your appointments. Call [facility] [phone] Contact information: [address] [place] [address] [phone]        [facility] Follow up on 03/30/2017.   Why:  Friday at 11:00.  Contact information: [address] [place] [address] [phone]          Next level of care provider has access to [facility] Link:no  Safety Planning and Suicide Prevention discussed: Yes,  yes  Have you used any form of tobacco in the last 30 days? (Cigarettes, Smokeless Tobacco, Cigars, and/or Pipes): No  Has patient been referred to the Quitline?: N/A patient is not a smoker  Patient has been referred for addiction treatment: N/A  Ida Rogue, LCSW 03/23/2017, 10:17 AM

## 2017-03-23 NOTE — Progress Notes (Signed)
DAR Note: Pt at the time of assessment was flat, isolative and withdrawn to room; remained in bed for most of the evening. Pt at the time endorsed moderate anxiety; states, "I just go out of bed feeling anxious." Pt denied depression, pain, SI, HI or AVH. All patient's questions and concerns addressed. Pt was med compliant. Support, encouragement, and safe environment provided. Pt had minimal interaction with staffs.

## 2017-03-23 NOTE — BHH Group Notes (Signed)
LCSW Group Therapy Note   04/06/2017 1:15pm   Type of Therapy and Topic:  Group Therapy:  Positive Affirmations   Participation Level:  Active  Description of Group: This group addressed positive affirmation toward self and others. Patients went around the room and identified two positive things about themselves and two positive things about a peer in the room. Patients reflected on how it felt to share something positive with others, to identify positive things about themselves, and to hear positive things from others. Patients were encouraged to have a daily reflection of positive characteristics or circumstances.  Therapeutic Goals 1. Patient will verbalize two of their positive qualities 2. Patient will demonstrate empathy for others by stating two positive qualities about a peer in the group 3. Patient will verbalize their feelings when voicing positive self affirmations and when voicing positive affirmations of others 4. Patients will discuss the potential positive impact on their wellness/recovery of focusing on positive traits of self and others. Summary of Patient Progress:  Meghan Hensley did not remain in group the entire time. She left the room after sharing.  While in group she was engaged the entire time.  Meghan Hensley contributes her hope to having practices or rituals and enjoying the small things in life.  She states that she tries to remain positive everyday.   Therapeutic Modalities Cognitive Behavioral Therapy Motivational Interviewing  [name] Work 04/06/2017 11:34 AM

## 2017-03-23 NOTE — Discharge Summary (Signed)
Physician Discharge Summary Note  Patient:  Meghan Hensley is an 46 y.o., female MRN:  [id number] DOB:  [birth year] Patient phone:  [phone] (home)  Patient address:   [address] [place] [address],  Total Time spent with patient: 30 minutes  Date of Admission:  03/17/2017 Date of Discharge:  03/23/2017  Reason for Admission:Meghan Hensley is a 46 year old female with schizoaffective disorder, hypothyroidism who originally presented to ED with fatigue, weakness in the setting of non adherence to medication, and is admitted to [facility] with concern for SI with plan to hurt herself with a knife.  She endorses fatigue and asks the interview to be done briefly as possible.  She states that she came here with fatigue. She states that she has not been on any medication for the past six weeks or so. She states that it is not she does not like to take her medication, but she wants "clean environment." She moved out from the apartment and lives in a hotel by herself.  She states that she was "dealing with a couple in the hotel"; when she is asked to elaborate it, she declines it stating that she does not feel like talking more. She is estranged from the family and denies having any children. She states that she feels sad, down, and has vague passive SI. She denies HI, AH/VH. She reports insomnia. UDS negative on admission. No known history of alcohol/drug use.   Associated Signs/Symptoms: Depression Symptoms:  depressed mood, anhedonia, insomnia, fatigue, recurrent thoughts of death, (Hypo) Manic Symptoms:  unable to elaborate it (declines to do interview) Anxiety Symptoms:  mild anxiety Psychotic Symptoms:  denies PTSD Symptoms: unable to elaborate it (declines to do interview) Total Time spent with patient: 30 minutes  Past Psychiatric History:  She does not have outpatient psychiatrist, and her medication is managed by her PCP [facility] admission in 02/2016, dx with schizoaffective  disorder. Per chart, she has had several psych admissions in the past.   Principal Problem: Schizoaffective disorder, bipolar type [facility]) Discharge Diagnoses: Patient Active Problem List   Diagnosis Date Noted  . Akathisia [G25.71] 03/03/2016  . Hypothyroidism [E03.9] 02/21/2016  . Bilateral edema of lower extremity [R60.0] 02/21/2016  . Schizoaffective disorder, bipolar type (HCC) [F25.0] 02/17/2016    Past Psychiatric History: See Above Past Medical History:  Past Medical History:  Diagnosis Date  . Bipolar 1 disorder (HCC)   . Schizophrenia (HCC)   . Thyroid disease    History reviewed. No pertinent surgical history. Family History:  Family History  Problem Relation Age of Onset  . Cancer Father   . Mental illness Neg Hx    Family Psychiatric  History: See Above Social History:  History  Alcohol Use No     History  Drug Use No    Social History   Social History  . Marital status: Divorced    Spouse name: N/A  . Number of children: N/A  . Years of education: N/A   Social History Main Topics  . Smoking status: Former Games developer  . Smokeless tobacco: Never Used  . Alcohol use No  . Drug use: No  . Sexual activity: Not Currently   Other Topics Concern  . None   Social History Narrative  . None    Hospital Course:  Madaline Lefeber was admitted for Schizoaffective disorder, bipolar type [facility]) and crisis management.  Pt was treated discharged with the medications listed below under Medication List.  Medical problems were identified and  treated as needed.  Home medications were restarted as appropriate.  Improvement was monitored by observation and [name] 's daily report of symptom reduction.  Emotional and mental status was monitored by daily self-inventory reports completed by [name] and clinical staff.         Ithzel Fedorchak was evaluated by the treatment team for stability and plans for continued recovery upon discharge. Shakala Marlatt 's motivation was an integral factor for scheduling further treatment. Employment, transportation, bed availability, health status, family support, and any pending legal issues were also considered during hospital stay. Pt was offered further treatment options upon discharge including but not limited to Residential, Intensive Outpatient, and Outpatient treatment.  Sonora Catlin will follow up with the services as listed below under Follow Up Information.     Upon completion of this admission the patient was both mentally and medically stable for discharge denying suicidal/homicidal ideation, auditory/visual/tactile hallucinations, delusional thoughts and paranoia.    Tamsin Nader responded well to treatment with Start Depakote 500 mg qhs; consider uptitration in the near future, Start Abilify 5 mg daily, Continue trazodone 50 mg qhs for insomnia,  Continue artane for EPS,  Continue synthroid 250 mcg for hypothyroidism.  - Original EKG; QTc 552 msec on 8/15. Repeat EKG qtc 483.  Pt demonstrated improvement without reported or observed adverse effects to the point of stability appropriate for outpatient management. Pertinent labs include: Valproic Acid <10 none detected.  Lipid Panel, BMP and CBC for which outpatient follow-up is necessary for lab recheck as mentioned below. Reviewed CBC, CMP, BAL, and UDS; all unremarkable aside from noted exceptions.   Physical Findings: AIMS: Facial and Oral Movements Muscles of Facial Expression: (P) None, normal Lips and Perioral Area: (P) None, normal Jaw: (P) None, normal Tongue: (P) None, normal,Extremity Movements Upper (arms, wrists, hands, fingers): (P) None, normal Lower (legs, knees, ankles, toes): (P) None, normal, Trunk Movements Neck, shoulders, hips: (P) None, normal, Overall Severity Severity of abnormal movements (highest score from questions above): (P) None, normal Incapacitation due to abnormal movements: (P) None,  normal Patient's awareness of abnormal movements (rate only patient's report): (P) No Awareness, Dental Status Current problems with teeth and/or dentures?: (P) No Does patient usually wear dentures?: (P) No  CIWA:    COWS:     Musculoskeletal: Strength & Muscle Tone: within normal limits Gait & Station: normal Patient leans: N/A  Psychiatric Specialty Exam: Physical Exam  Nursing note and vitals reviewed. Constitutional: She appears well-developed.  Cardiovascular: Normal rate.   Musculoskeletal: Normal range of motion.  Neurological: She is alert.  Skin: Skin is warm.  Psychiatric: She has a normal mood and affect. Her behavior is normal.    Review of Systems  Psychiatric/Behavioral: Negative for suicidal ideas. Depression: stable. Hallucinations: stable. The patient does not have insomnia. Nervous/anxious: stable.     Blood pressure 120/68, pulse 91, temperature 97.9 F (36.6 C), temperature source Oral, resp. rate 18, height 5\' 10"  (1.778 m), weight (!) 145.2 kg (320 lb), SpO2 97 %.Body mass index is 45.92 kg/m.    Have you used any form of tobacco in the last 30 days? (Cigarettes, Smokeless Tobacco, Cigars, and/or Pipes): No  Has this patient used any form of tobacco in the last 30 days? (Cigarettes, Smokeless Tobacco, Cigars, and/or Pipes) Yes, Yes, A prescription for an FDA-approved tobacco cessation medication was offered at discharge and the patient refused  Blood Alcohol level:  Lab Results  Component Value Date   [facility] <5 03/17/2017  ETH <5 03/13/2016    Metabolic Disorder Labs:  Lab Results  Component Value Date   HGBA1C 5.1 03/20/2017   MPG 99.67 03/20/2017   MPG 100 03/01/2016   Lab Results  Component Value Date   PROLACTIN 23.5 (H) 03/20/2017   Lab Results  Component Value Date   CHOL 215 (H) 03/20/2017   TRIG 218 (H) 03/20/2017   HDL 42 03/20/2017   CHOLHDL 5.1 03/20/2017   VLDL 44 (H) 03/20/2017   LDLCALC 129 (H) 03/20/2017   LDLCALC 86  03/01/2016    See Psychiatric Specialty Exam and Suicide Risk Assessment completed by Attending Physician prior to discharge.  Discharge destination:  Home  Is patient on multiple antipsychotic therapies at discharge:  No   Has Patient had three or more failed trials of antipsychotic monotherapy by history:  No  Recommended Plan for Multiple Antipsychotic Therapies: NA   Allergies as of 03/23/2017   No Known Allergies     Medication List    STOP taking these medications   antiseptic oral rinse Liqd   divalproex 500 MG 24 hr tablet Commonly known as:  DEPAKOTE ER   gabapentin 100 MG capsule Commonly known as:  NEURONTIN   sertraline 100 MG tablet Commonly known as:  ZOLOFT     TAKE these medications     Indication  ARIPiprazole 10 MG tablet Commonly known as:  ABILIFY Take 1 tablet (10 mg total) by mouth daily. What changed:  medication strength  how much to take  when to take this  Indication:  Major Depressive Disorder   hydrOXYzine 25 MG tablet Commonly known as:  ATARAX/VISTARIL Take 1 tablet (25 mg total) by mouth every 6 (six) hours as needed for anxiety.  Indication:  Feeling Anxious   lamoTRIgine 25 MG tablet Commonly known as:  LAMICTAL Take 1 tablet (25 mg total) by mouth daily.  Indication:  mood stability   levothyroxine 125 MCG tablet Commonly known as:  SYNTHROID, LEVOTHROID Take 2 tablets (250 mcg total) by mouth daily before breakfast. What changed:  Another medication with the same name was removed. Continue taking this medication, and follow the directions you see here.  Indication:  Underactive Thyroid   pantoprazole 40 MG tablet Commonly known as:  PROTONIX Take 1 tablet (40 mg total) by mouth 2 (two) times daily before a meal.  Indication:  Gastroesophageal Reflux Disease   traZODone 150 MG tablet Commonly known as:  DESYREL Take 0.5 tablets (75 mg total) by mouth at bedtime. What changed:  medication strength  how much to  take  when to take this  Indication:  Trouble Sleeping   trihexyphenidyl 2 MG tablet Commonly known as:  ARTANE Take 1 tablet (2 mg total) by mouth at bedtime.  Indication:  Extrapyramidal Reaction caused by Medications      Follow-up Information    [facility] Follow up on 03/28/2017.   Why:  Wednesday at 8AM with Archie Patten and Marcial Pacas for your hospital follow up appointment Contact information: [address] [place] [address] [phone]        [facility] Follow up on 03/30/2017.   Why:  Friday at 11:00.  Contact information: [address] [place] [address] [phone]          Follow-up recommendations:  Activity:  as tolerated Diet:  heart healthy Other:  Recheck TSH for thyroid managemt  Comments:  Take all medications as prescribed. Keep all follow-up appointments as scheduled.  Do not consume alcohol or use illegal drugs while on prescription medications. Report any adverse effects from your medications to your primary care provider promptly.  In the event of recurrent symptoms or worsening symptoms, call 911, a crisis hotline, or go to the nearest emergency department for evaluation.   Signed: Truman Hayward, FNP 03/23/2017, 9:28 AM

## 2017-03-23 NOTE — BHH Suicide Risk Assessment (Signed)
[facility] Discharge Suicide Risk Assessment   Principal Problem: Schizoaffective disorder, bipolar type [facility]) Discharge Diagnoses:  Patient Active Problem List   Diagnosis Date Noted  . Akathisia [G25.71] 03/03/2016  . Hypothyroidism [E03.9] 02/21/2016  . Bilateral edema of lower extremity [R60.0] 02/21/2016  . Schizoaffective disorder, bipolar type [facility]) [F25.0] 02/17/2016  Patient is a 46 year old female transferred from [place] long ED for stabilization and treatment of having thoughts of wanting to hurt herself with a knife as she felt that because of her thyroid issues she had low energy. Patient reported that she had trouble eating, had to push herself to eat 1 or 2 nipples of food. She added that she's not doing well with her overall health and so wants to end her life.  Patient this morning reports that after she was informed that her TSH was much lower, states that she plans to take her Synthroid regularly as she knows it helps with her depression, anxiety and makes her feel less tired. She has that she follows up with [facility] but discussed in length with patient the need to see a PCP due to her hypothyroidism. Patient denies any delusions or paranoia, any thoughts of self-harm or harm to others. She denies any hallucinations, reports that she's eating better and sleeping well. She does add that she is anxious about discharge as she really has no place to stay. On being questioned further, patient states that she stayed in a motel in the past and plans to do so. She denies any feelings of hopelessness, worthlessness or guilt. On a scale of 0-10, with 0 being no symptoms in 10 being the worst patient rates her depression is a 2 out of 10. She adds that having hypothyroidism is an aggravating factor along with not having social support and having financial issues.  Total Time spent with patient: 30 minutes  Musculoskeletal: Strength & Muscle Tone: within normal limits Gait & Station:  normal Patient leans: N/A  Psychiatric Specialty Exam: Review of Systems  Constitutional: Positive for malaise/fatigue. Negative for fever.  HENT: Negative.  Negative for congestion and sore throat.   Eyes: Negative.  Negative for blurred vision, double vision, discharge and redness.  Respiratory: Negative.  Negative for cough, shortness of breath and wheezing.   Cardiovascular: Negative.  Negative for chest pain and palpitations.  Gastrointestinal: Negative for abdominal pain, diarrhea, heartburn, nausea and vomiting.  Musculoskeletal: Negative for falls.  Neurological: Negative.  Negative for dizziness, seizures, loss of consciousness, weakness and headaches.  Psychiatric/Behavioral: Negative for depression, hallucinations, substance abuse and suicidal ideas. The patient is nervous/anxious. The patient does not have insomnia.     Blood pressure 121/77, pulse (!) 114, temperature 97.7 F (36.5 C), temperature source Oral, resp. rate 16, height 5\' 10"  (1.778 m), weight (!) 145.2 kg (320 lb), SpO2 97 %.Body mass index is 45.92 kg/m.  General Appearance: Disheveled  Eye Contact::  Good  Speech:  Clear and Coherent and Normal Rate409  Volume:  Normal  Mood:  Anxious  Affect:  Non-Congruent and Full Range  Thought Process:  Coherent, Goal Directed and Descriptions of Associations: Intact  Orientation:  Full (Time, Place, and Person)  Thought Content:  WDL and Rumination  Suicidal Thoughts:  No  Homicidal Thoughts:  No  Memory:  Immediate;   Fair Recent;   Fair Remote;   Fair  Judgement:  Intact  Insight:  Lacking  Psychomotor Activity:  Normal  Concentration:  Fair  Recall:  [employer] of Knowledge:Fair  Language:  Fair  Akathisia:  No  Handed:  Right  AIMS (if indicated):     Assets:  Desire for Improvement  Sleep:  Number of Hours: 5.5  Cognition: WNL  ADL's:  Intact   Mental Status Per Nursing Assessment::   On Admission:     Demographic Factors:  Caucasian, Low  socioeconomic status and Unemployed  Loss Factors: Financial problems/change in socioeconomic status  Historical Factors: Family history of mental illness or substance abuse and Impulsivity  Risk Reduction Factors:   NA  Continued Clinical Symptoms:  Previous Psychiatric Diagnoses and Treatments Medical Diagnoses and Treatments/Surgeries  Cognitive Features That Contribute To Risk:  Closed-mindedness    Suicide Risk:  Minimal: No identifiable suicidal ideation.  Patients presenting with no risk factors but with morbid ruminations; may be classified as minimal risk based on the severity of the depressive symptoms  Follow-up Information    [facility] Follow up on 03/28/2017.   Why:  Wednesday at 8AM with Archie Patten and Marcial Pacas for your hospital follow up appointment Contact information: [address] [place] [address] [phone]           Plan Of Care/Follow-up recommendations:  Activity:  As tolerated Diet:  Regular Other:  Keep follow-up appointments and take medications as prescribed  Nelly Rout, MD 03/23/2017, 8:24 AM

## 2017-03-23 NOTE — Progress Notes (Signed)
D: Patient's self inventory sheet: patient has fair sleep, recieved sleep medication.fair  Appetite, low/hyper energy level, poor concentration. Rated depression 6/10, hopeless 5/10, anxiety 7/10. SI/HI/AVH: denies. Physical complaints are pain in lower back and right leg. 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 Plans to work on "go to group".   A: Medications administered, assessed medication knowledge and education given on medication regimen.  Emotional support and encouragement given patient. R: Denies SI and HI , contracts for safety. Safety maintained with 15 minute checks.

## 2017-03-30 ENCOUNTER — Inpatient Hospital Stay: Payer: Self-pay

## 2018-07-02 ENCOUNTER — Ambulatory Visit
Admission: RE | Admit: 2018-07-02 | Discharge: 2018-07-02 | Disposition: A | Discharge status: Home / Self Care | Payer: Medicare Other | Source: Ambulatory Visit | Attending: Specialist | Admitting: Specialist

## 2018-07-02 ENCOUNTER — Other Ambulatory Visit: Payer: Self-pay | Admitting: Nurse Practitioner

## 2018-07-02 DIAGNOSIS — Z1231 Encounter for screening mammogram for malignant neoplasm of breast: Secondary | ICD-10-CM

## 2018-12-30 DEATH — deceased

## 2020-07-22 IMAGING — MG DIGITAL SCREENING BILATERAL MAMMOGRAM WITH TOMO AND CAD
6 of 10 series · 6 of 30 positions shown · non-contrast
Comparison: None.

CLINICAL DATA: Screening.

EXAM:
DIGITAL SCREENING BILATERAL MAMMOGRAM WITH TOMO AND CAD

[L CC synth-2D]
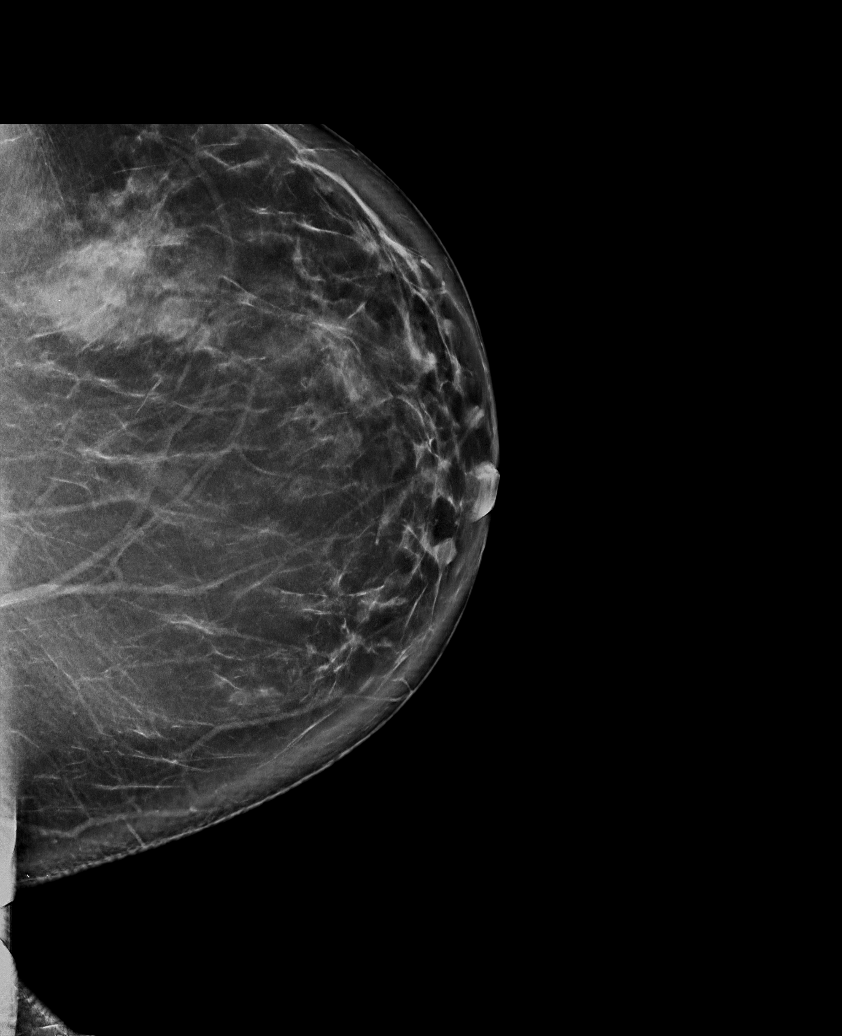

[R MLO synth-2D (1 of 2)]
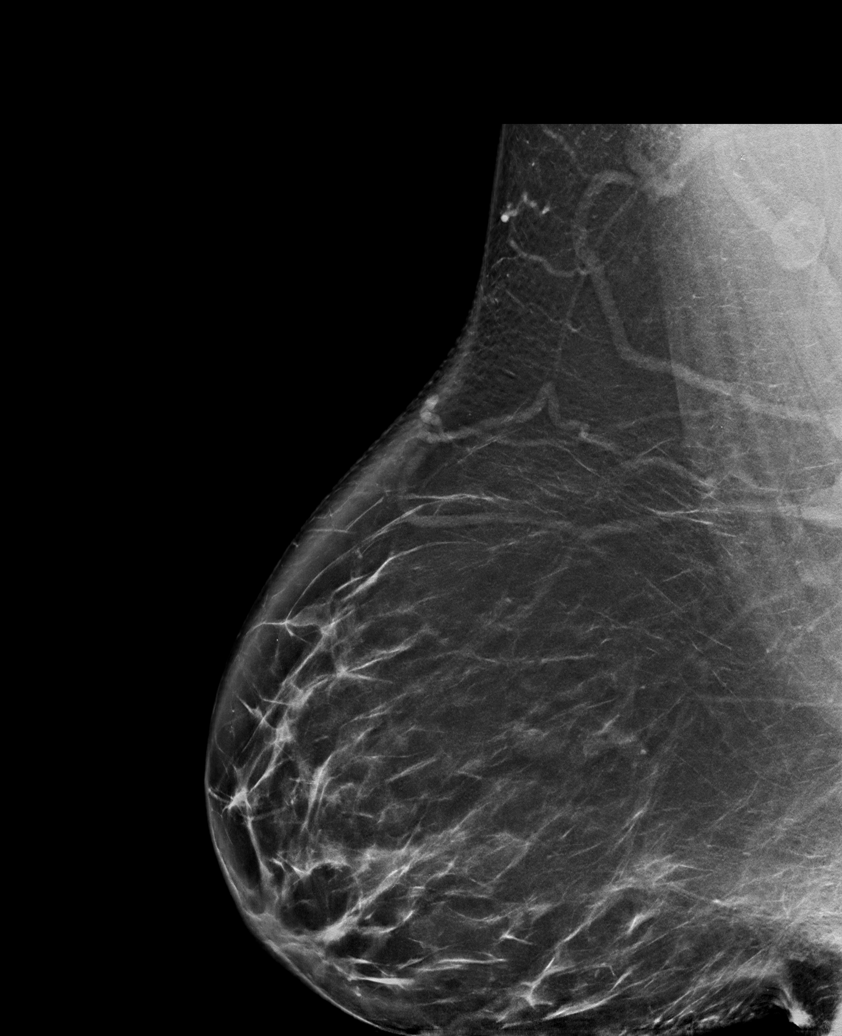

[L MLO synth-2D]
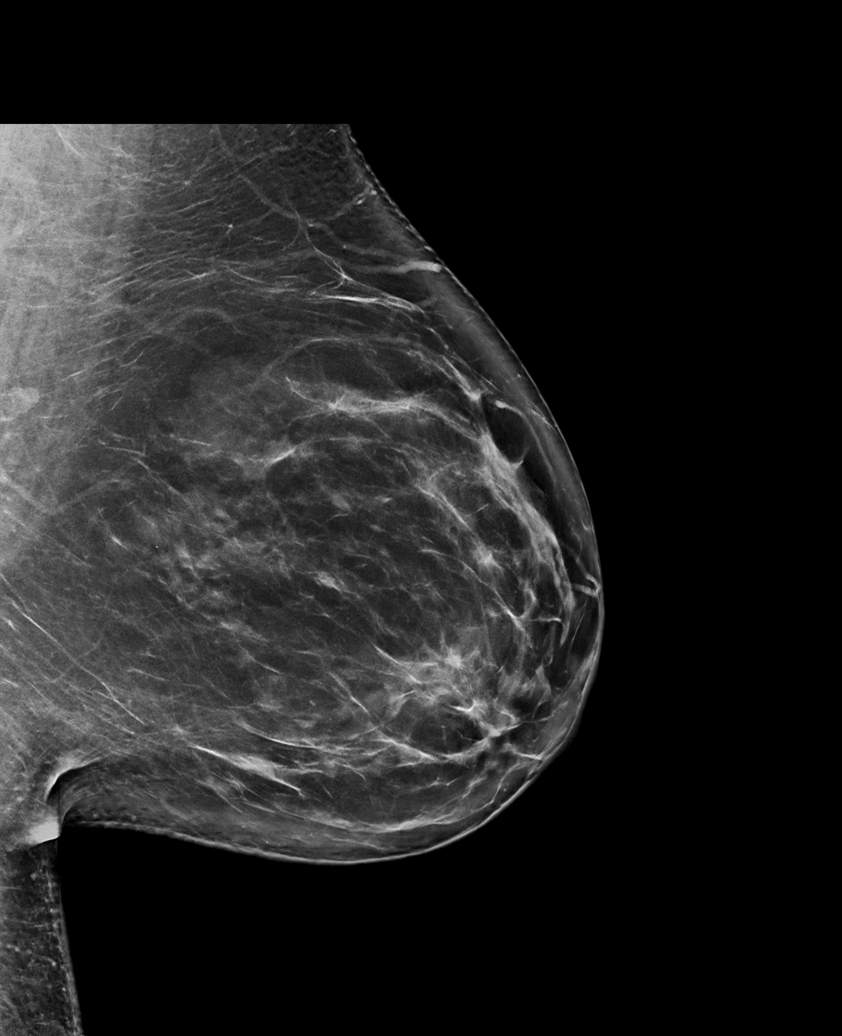

[R MLO synth-2D (2 of 2)]
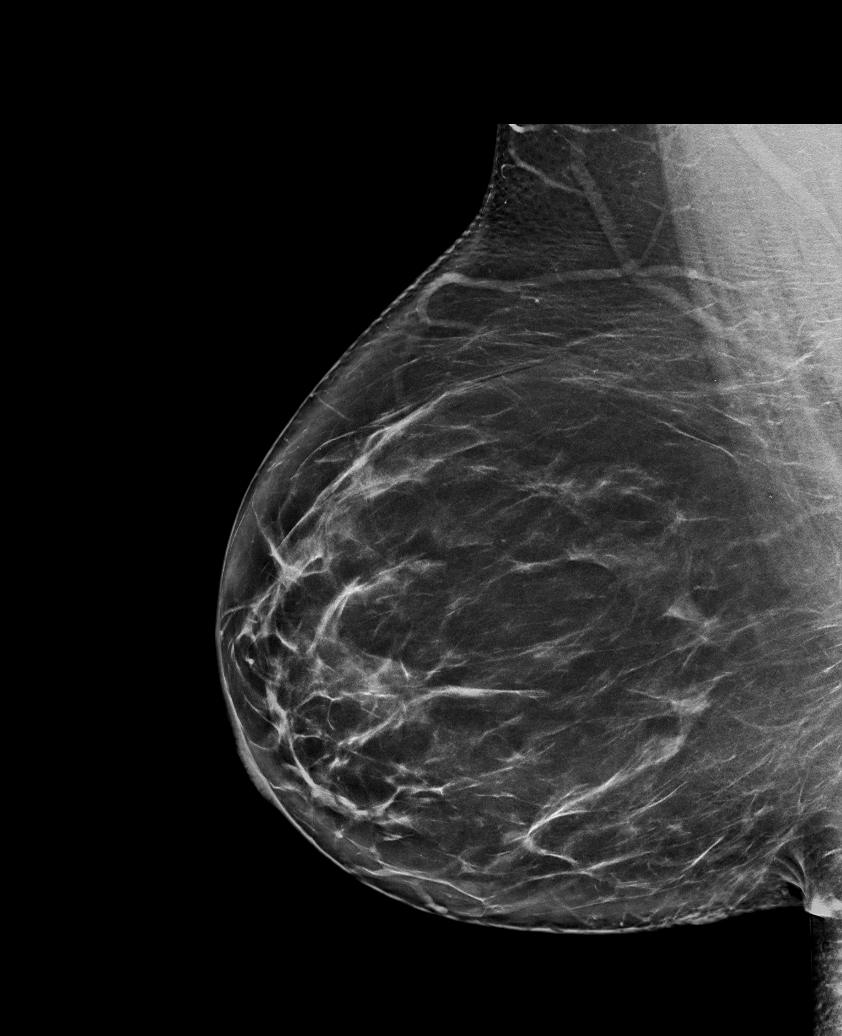

[R CC synth-2D]
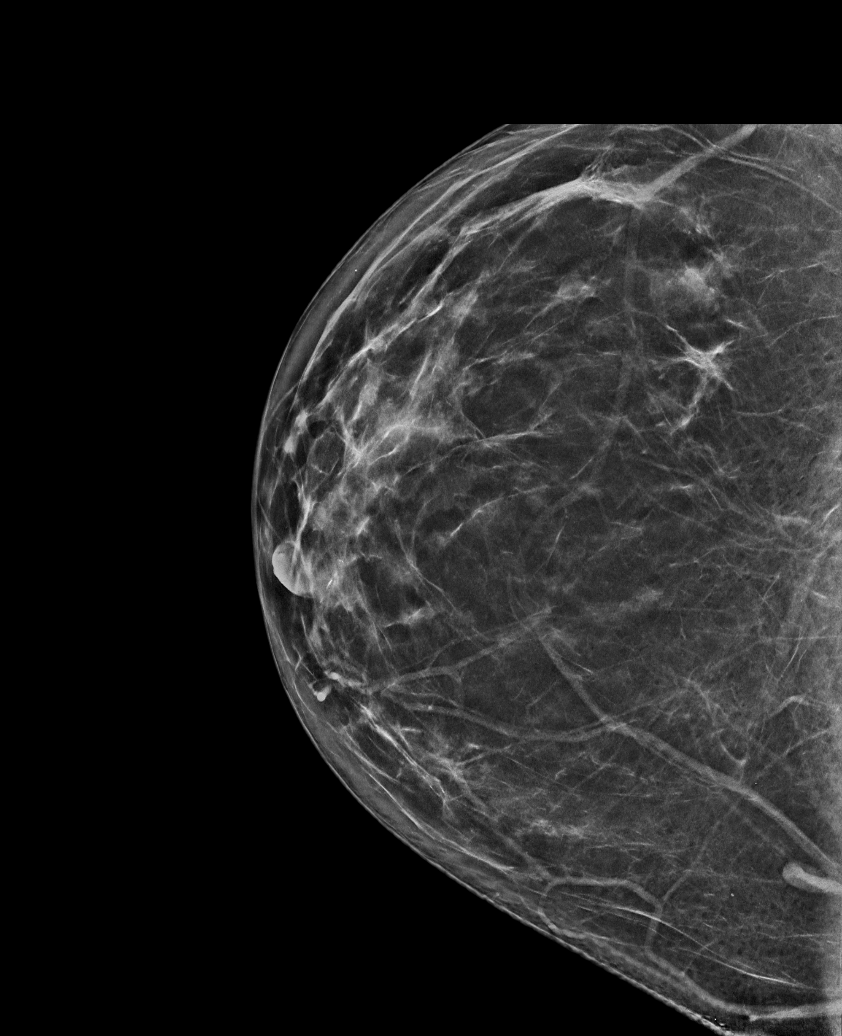

[L CC tomo · tomo slice 51/100.0]
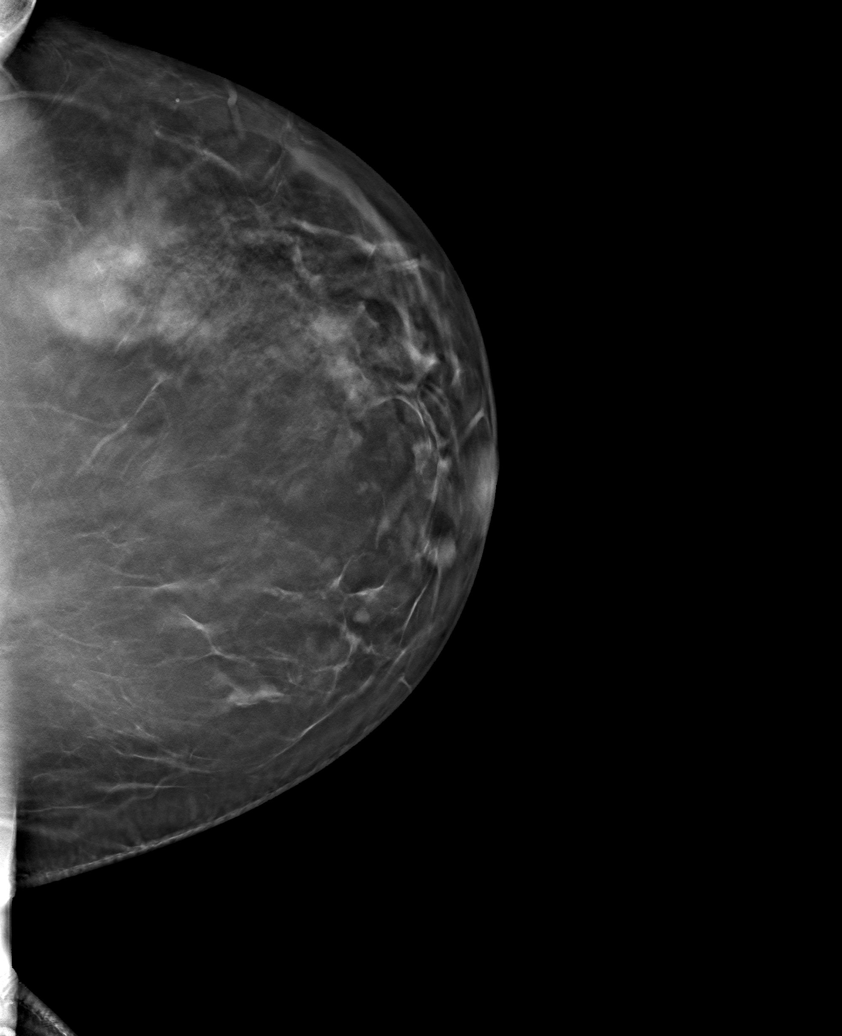

[6 of 30 positions shown; findings below may reference images not displayed]

ACR Breast Density Category c: The breast tissue is heterogeneously
dense, which may obscure small masses
FINDINGS: There are no findings suspicious for malignancy. Images were
processed with CAD.
IMPRESSION: No mammographic evidence of malignancy. A result letter of this
screening mammogram will be mailed directly to the patient.

RECOMMENDATION:
Screening mammogram in one year. (Code:EM-2-IHY)

BI-RADS CATEGORY  1: Negative.
